# Patient Record
Sex: Male | Born: 2000
Health system: Southern US, Community
[De-identification: ages and names within clinical notes are randomized; demographics above are authoritative.]

## PROBLEM LIST (undated history)

## (undated) DIAGNOSIS — K219 Gastro-esophageal reflux disease without esophagitis: Secondary | ICD-10-CM

## (undated) HISTORY — DX: Gastro-esophageal reflux disease without esophagitis: K21.9

---
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
## 2021-01-30 ENCOUNTER — Other Ambulatory Visit: Payer: Self-pay | Admitting: Physician Assistant

## 2021-01-30 DIAGNOSIS — R1011 Right upper quadrant pain: Secondary | ICD-10-CM

## 2021-01-30 DIAGNOSIS — R1319 Other dysphagia: Secondary | ICD-10-CM

## 2021-02-05 ENCOUNTER — Encounter: Payer: Self-pay | Admitting: Neurology

## 2021-02-13 ENCOUNTER — Ambulatory Visit
Admission: RE | Admit: 2021-02-13 | Discharge: 2021-02-13 | Disposition: A | Discharge status: Home / Self Care | Payer: 59 | Source: Ambulatory Visit | Attending: Physician Assistant | Admitting: Physician Assistant

## 2021-02-13 DIAGNOSIS — R1319 Other dysphagia: Secondary | ICD-10-CM

## 2021-02-13 DIAGNOSIS — R1011 Right upper quadrant pain: Secondary | ICD-10-CM

## 2021-04-11 NOTE — Progress Notes (Signed)
NEUROLOGY CONSULTATION NOTE  Joseph Wilson MRN: [id number] DOB: 05/08/2001  Referring provider: Eliane Decree, PA Primary care provider: Eliane Decree, PA  Reason for consult:  headaches  Assessment/Plan:   1.  Chronic daily headaches - appear to be tension type headaches, likely aggravated by underlying psychiatric distress 2.  Underlying psychiatric disorder with depression and anxiety.  1.  At this time, I would like to pursue a neurologic workup for his symptoms: -  MRI of brain with and without contrast -  EEG 2.  He should continue sessions with his therapist.  However, he was advised to establish care with a psychiatrist as soon as possible. 3.  While he is not under the care of a psychiatrist, I am not yet comfortable treating his headaches with medication as I would typically use an antidepressant or antiepileptic medication which have mood-altering effects - as he has an undiagnosed psychiatric disorder, I do not want to start a medication that may aggravate his symptoms. Patient and his mother are in agreement.   4.  Follow up after testing.   Subjective:  Joseph Wilson is a 20 year old right-handed male who presents for headaches.  History supplemented by referring provider's note.  He is accompanied by his mother who also supplements history.  Patient has GERD.  Last summer, he was started on omeprazole.  Afterwards, he developed a psychosis with increased depression and anxiety as well as intermittent chest pressure, tingling of his extremities, and shaking/abnormal movements.  He had to withdraw from college.  Omeprazole was discontinued around October or November.  He has had some improvement but reports headaches since then.  He notes a pressure/squeezing at the crown, usually mild but occasionally severe, lasting all day.  No associated nausea, vomiting, photophobia, phonophobia, visual disturbance, numbness or weakness.  They occur 6 out of 7 days a week.  He  does not treat with any NSAIDs or analgesics because he is afraid he may have another adverse reaction.  With the headaches, he may feel foggy-headed.  He also has exhibited abnormal behavior.  During the winter, while it was snowing, he was outside laying on his lawn in the snow wearing just a short-sleave shirt and pants.  He also reports that he has disturbing thoughts such as feeling that he hates himself or that Jesus isn't real.  He has had suicidal ideation but denies any plan.  His PCP is aware of these thoughts.  He is seeing a therapist but has not yet been able to schedule an appointment with a psychiatrist.     01/09/2021 LABS:  TSH 1.62; CMP with Na 141, K 4.4, Cl 102, Co2 30, glucose 78, Ca 10.4, BUN 17, Cr 1.23, GFR 92, t bili 1.4, ALP 35, AST 16, ALT 19; CBC with WBC 3.4, HGB 15.2, HCT 45.4, PLT 195  PAST MEDICAL HISTORY: Past Medical History:  Diagnosis Date  . Acid reflux     PAST SURGICAL HISTORY: History reviewed. No pertinent surgical history.  MEDICATIONS: Current Outpatient Medications on File Prior to Visit  Medication Sig Dispense Refill  . fluticasone (FLOVENT HFA) 220 MCG/ACT inhaler 2 puffs    . sucralfate (CARAFATE) 1 g tablet SMARTSIG:1 Tablet(s) By Mouth 1 to 3 Times Daily     No current facility-administered medications on file prior to visit.    ALLERGIES: Allergies  Allergen Reactions  . Omeprazole Other (See Comments)   FAMILY HISTORY: History reviewed. No pertinent family history.  Objective:  Blood  pressure 119/78, pulse 94, resp. rate 20, height 6\' 3"  (1.905 m), weight 167 lb (75.8 kg), SpO2 98 %. General: No acute distress.  Patient appears well-groomed.   Head:  Normocephalic/atraumatic Eyes:  fundi examined but not visualized Neck: supple, no paraspinal tenderness, full range of motion Back: No paraspinal tenderness Heart: regular rate and rhythm Lungs: Clear to auscultation bilaterally. Vascular: No carotid bruits. Neurological  Exam: Mental status: alert and oriented to person, place, and time, recent and remote memory intact, fund of knowledge intact, attention and concentration intact, speech fluent and not dysarthric, language intact. Cranial nerves: CN I: not tested CN II: pupils equal, round and reactive to light, visual fields intact CN III, IV, VI:  full range of motion, no nystagmus, no ptosis CN V: facial sensation intact. CN VII: upper and lower face symmetric CN VIII: hearing intact CN IX, X: gag intact, uvula midline CN XI: sternocleidomastoid and trapezius muscles intact CN XII: tongue midline Bulk & Tone: normal, no fasciculations. Motor:  muscle strength 5/5 throughout Sensation:  Pinprick, temperature and vibratory sensation intact. Deep Tendon Reflexes:  2+ throughout,  toes downgoing.   Finger to nose testing:  Without dysmetria.   Heel to shin:  Without dysmetria.   Gait:  Normal station and stride.  Romberg negative.    Thank you for allowing me to take part in the care of this patient.  , DO  CC: Shon Millet, PA

## 2021-04-12 ENCOUNTER — Ambulatory Visit (INDEPENDENT_AMBULATORY_CARE_PROVIDER_SITE_OTHER): Payer: 59 | Admitting: Neurology

## 2021-04-12 ENCOUNTER — Other Ambulatory Visit: Payer: Self-pay

## 2021-04-12 ENCOUNTER — Encounter: Payer: Self-pay | Admitting: Neurology

## 2021-04-12 VITALS — BP 119/78 | HR 94 | Resp 20 | Ht 75.0 in | Wt 167.0 lb

## 2021-04-12 DIAGNOSIS — R4689 Other symptoms and signs involving appearance and behavior: Secondary | ICD-10-CM | POA: Diagnosis not present

## 2021-04-12 DIAGNOSIS — F419 Anxiety disorder, unspecified: Secondary | ICD-10-CM | POA: Diagnosis not present

## 2021-04-12 DIAGNOSIS — R519 Headache, unspecified: Secondary | ICD-10-CM | POA: Diagnosis not present

## 2021-04-12 NOTE — Patient Instructions (Signed)
1.  Check MRI of brain with and without contrast 2.  Check routine EEG 3.  Recommend establishing care with a psychiatrist as soon as possible.

## 2021-04-13 NOTE — Progress Notes (Signed)
Patient ID: [id number] Time: Time: 04/13/2021 9:14 AM Patient Name: Joseph Wilson, Joseph Wilson Patient DOB: 04/27/2001 Physician Name: Everlena Cooper, ADAM Physician Address: [address] [place], [place] [address] Physician Tax RV:[id number] CPT Code: [phone] Case Number: [id number]  This member's plan does not currently require notification or prior-authorization through the [employer] Notification or Prior-Authorization Program. Please contact a Customer Care Professional at [phone] if you believe the information returned to be in error.

## 2021-04-19 ENCOUNTER — Ambulatory Visit (INDEPENDENT_AMBULATORY_CARE_PROVIDER_SITE_OTHER): Payer: 59 | Admitting: Neurology

## 2021-04-19 ENCOUNTER — Other Ambulatory Visit: Payer: Self-pay

## 2021-04-19 DIAGNOSIS — R519 Headache, unspecified: Secondary | ICD-10-CM

## 2021-04-19 DIAGNOSIS — F419 Anxiety disorder, unspecified: Secondary | ICD-10-CM

## 2021-04-19 DIAGNOSIS — R4689 Other symptoms and signs involving appearance and behavior: Secondary | ICD-10-CM

## 2021-04-20 NOTE — Procedures (Signed)
ELECTROENCEPHALOGRAM REPORT  Date of Study: 04/19/2021  Patient's Name: Joseph Wilson MRN: [id number] Date of Birth: Oct 09, 2001  Referring Provider: Eliane Decree, PA  Clinical History: 20 year old male with chronic daily headaches and underlying psychiatric symptoms presenting with unpleasant thoughts and abnormal behavior.  Medications: Flovent Carafate  Technical Summary: A multichannel digital EEG recording measured by the international 10-20 system with electrodes applied with paste and impedances below 5000 ohms performed in our laboratory with EKG monitoring in an awake and asleep patient.  Photic stimulation was performed.  The digital EEG was referentially recorded, reformatted, and digitally filtered in a variety of bipolar and referential montages for optimal display.    Description: The patient is awake and asleep during the recording.  During maximal wakefulness, there is a symmetric, medium voltage 11 Hz posterior dominant rhythm that attenuates with eye opening.  The record is symmetric.  During drowsiness and sleep, there is an increase in theta slowing of the background.  Stage 2 sleep was seen.  Photic stimulation did not elicit any abnormalities.  During recording, patient pressed button when he noted body jerking, which was not observed on video and produced no electrographic correlate.  There were no epileptiform discharges or electrographic seizures seen.    EKG lead was unremarkable.  Impression: This awake and asleep EEG is normal.    Clinical Correlation: A normal EEG does not exclude a clinical diagnosis of epilepsy.  If further clinical questions remain, prolonged EEG may be helpful.  Clinical correlation is advised.   Shon Millet, DO

## 2022-06-23 ENCOUNTER — Encounter (HOSPITAL_COMMUNITY): Payer: Self-pay | Admitting: Registered Nurse

## 2022-06-23 ENCOUNTER — Ambulatory Visit (INDEPENDENT_AMBULATORY_CARE_PROVIDER_SITE_OTHER)
Admission: EM | Admit: 2022-06-23 | Discharge: 2022-06-24 | Disposition: A | Discharge status: Other Acute Inpatient Hospital | Payer: 59 | Source: Home / Self Care

## 2022-06-23 DIAGNOSIS — F29 Unspecified psychosis not due to a substance or known physiological condition: Secondary | ICD-10-CM | POA: Diagnosis present

## 2022-06-23 DIAGNOSIS — F22 Delusional disorders: Secondary | ICD-10-CM | POA: Insufficient documentation

## 2022-06-23 DIAGNOSIS — F600 Paranoid personality disorder: Secondary | ICD-10-CM | POA: Insufficient documentation

## 2022-06-23 DIAGNOSIS — Z20822 Contact with and (suspected) exposure to covid-19: Secondary | ICD-10-CM | POA: Insufficient documentation

## 2022-06-23 LAB — POCT URINE DRUG SCREEN - MANUAL ENTRY (I-SCREEN)
POC Amphetamine UR: NOT DETECTED
POC Buprenorphine (BUP): NOT DETECTED
POC Cocaine UR: NOT DETECTED
POC Marijuana UR: NOT DETECTED
POC Methadone UR: NOT DETECTED
POC Methamphetamine UR: NOT DETECTED
POC Morphine: NOT DETECTED
POC Oxazepam (BZO): NOT DETECTED
POC Oxycodone UR: NOT DETECTED
POC Secobarbital (BAR): NOT DETECTED

## 2022-06-23 LAB — COMPREHENSIVE METABOLIC PANEL
ALT: 15 U/L (ref 0–44)
AST: 18 U/L (ref 15–41)
Albumin: 4.3 g/dL (ref 3.5–5.0)
Alkaline Phosphatase: 40 U/L (ref 38–126)
Anion gap: 4 — ABNORMAL LOW (ref 5–15)
BUN: 12 mg/dL (ref 6–20)
CO2: 30 mmol/L (ref 22–32)
Calcium: 9.5 mg/dL (ref 8.9–10.3)
Chloride: 104 mmol/L (ref 98–111)
Creatinine, Ser: 1.06 mg/dL (ref 0.61–1.24)
GFR, Estimated: >60 mL/min (ref >60)
Glucose, Bld: 91 mg/dL (ref 70–99)
Potassium: 3.9 mmol/L (ref 3.5–5.1)
Sodium: 138 mmol/L (ref 135–145)
Total Bilirubin: 1.1 mg/dL (ref 0.3–1.2)
Total Protein: 6.9 g/dL (ref 6.5–8.1)

## 2022-06-23 LAB — HEMOGLOBIN A1C
Hgb A1c MFr Bld: 5.1 % (ref 4.8–5.6)
Mean Plasma Glucose: 99.67 mg/dL

## 2022-06-23 LAB — CBC WITH DIFFERENTIAL/PLATELET
Abs Immature Granulocytes: 0.02 10*3/uL (ref 0.00–0.07)
Basophils Absolute: 0 10*3/uL (ref 0.0–0.1)
Basophils Relative: 1 %
Eosinophils Absolute: 0 10*3/uL (ref 0.0–0.5)
Eosinophils Relative: 1 %
HCT: 45.3 % (ref 39.0–52.0)
Hemoglobin: 15.5 g/dL (ref 13.0–17.0)
Immature Granulocytes: 1 %
Lymphocytes Relative: 51 %
Lymphs Abs: 1.9 10*3/uL (ref 0.7–4.0)
MCH: 31.6 pg (ref 26.0–34.0)
MCHC: 34.2 g/dL (ref 30.0–36.0)
MCV: 92.4 fL (ref 80.0–100.0)
Monocytes Absolute: 0.4 10*3/uL (ref 0.1–1.0)
Monocytes Relative: 12 %
Neutro Abs: 1.2 10*3/uL — ABNORMAL LOW (ref 1.7–7.7)
Neutrophils Relative %: 34 %
Platelets: 168 10*3/uL (ref 150–400)
RBC: 4.9 MIL/uL (ref 4.22–5.81)
RDW: 12 % (ref 11.5–15.5)
WBC: 3.6 10*3/uL — ABNORMAL LOW (ref 4.0–10.5)
nRBC: 0 % (ref 0.0–0.2)

## 2022-06-23 LAB — URINALYSIS, ROUTINE W REFLEX MICROSCOPIC
Bilirubin Urine: NEGATIVE
Glucose, UA: NEGATIVE mg/dL
Hgb urine dipstick: NEGATIVE
Ketones, ur: NEGATIVE mg/dL
Leukocytes,Ua: NEGATIVE
Nitrite: NEGATIVE
Protein, ur: NEGATIVE mg/dL
Specific Gravity, Urine: 1.003 — ABNORMAL LOW (ref 1.005–1.030)
pH: 7 (ref 5.0–8.0)

## 2022-06-23 LAB — RESP PANEL BY RT-PCR (FLU A&B, COVID) ARPGX2
Influenza A by PCR: NEGATIVE
Influenza B by PCR: NEGATIVE
SARS Coronavirus 2 by RT PCR: NEGATIVE

## 2022-06-23 LAB — MAGNESIUM: Magnesium: 2 mg/dL (ref 1.7–2.4)

## 2022-06-23 LAB — LIPID PANEL
Cholesterol: 164 mg/dL (ref 0–200)
HDL: 63 mg/dL (ref >40)
LDL Cholesterol: 95 mg/dL (ref 0–99)
Total CHOL/HDL Ratio: 2.6 RATIO
Triglycerides: 32 mg/dL (ref <150)
VLDL: 6 mg/dL (ref 0–40)

## 2022-06-23 LAB — ETHANOL: Alcohol, Ethyl (B): <10 mg/dL (ref <10)

## 2022-06-23 LAB — TSH: TSH: 2.775 u[IU]/mL (ref 0.350–4.500)

## 2022-06-23 MED ORDER — ALUM & MAG HYDROXIDE-SIMETH 200-200-20 MG/5ML PO SUSP: 30.0000 mL | ORAL | Status: DC | PRN

## 2022-06-23 MED ORDER — OLANZAPINE 5 MG PO TBDP
5.0000 mg | ORAL_TABLET | Freq: Every day | ORAL | Status: DC
  Administered 2022-06-23: 5 mg via ORAL
  Filled 2022-06-23: qty 1

## 2022-06-23 MED ORDER — OLANZAPINE 2.5 MG PO TABS
2.5000 mg | ORAL_TABLET | Freq: Once | ORAL | Status: AC
  Administered 2022-06-23: 2.5 mg via ORAL
  Filled 2022-06-23: qty 1

## 2022-06-23 MED ORDER — HYDROXYZINE HCL 25 MG PO TABS: 25.0000 mg | ORAL_TABLET | Freq: Three times a day (TID) | ORAL | Status: DC | PRN

## 2022-06-23 MED ORDER — TRAZODONE HCL 50 MG PO TABS
50.0000 mg | ORAL_TABLET | Freq: Every evening | ORAL | Status: DC | PRN
  Administered 2022-06-23: 50 mg via ORAL
  Filled 2022-06-23: qty 1

## 2022-06-23 MED ORDER — ACETAMINOPHEN 325 MG PO TABS: 650.0000 mg | ORAL_TABLET | Freq: Four times a day (QID) | ORAL | Status: DC | PRN

## 2022-06-23 MED ORDER — MAGNESIUM HYDROXIDE 400 MG/5ML PO SUSP: 30.0000 mL | Freq: Every day | ORAL | Status: DC | PRN

## 2022-06-23 NOTE — ED Notes (Signed)
Pt is sitting up in a chair. Appears at time to be nodding off to sleep. Was encouraged to lay down.  Pt's father called to check on him and was given an update.  Staff will continue to monitor for safety.
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
## 2022-06-23 NOTE — BH Assessment (Signed)
Joseph Wilson, Urgent; male who presents voluntarily to [facility] via [employer].  Pt reports AVH, "I am seeing shadows and invisible people".  Pt denies SI or HI.   Pt presents disoriented and paranoia.  Pt denies using substance use. Pt denies any prior MH diagnosis or prescribed medication for symptom management.d  MSE signed by patient.

## 2022-06-23 NOTE — Progress Notes (Signed)
Per Assunta Found, NP, patient meets criteria for inpatient treatment. There are no available or appropriate beds at [facility] today. CSW faxed referrals to the following facilities for review:  [facility]  Pending - No Request Sent N/A [address] [place] [address] [phone] --  [facility]  Pending - No Request Sent N/A [address], [place] [address] [phone] --  [facility]  Pending - No Request [facility] [address] [place] [address] [phone] --  [facility]  Pending - No Request Sent N/A [address] [place] [address] [phone] --  [facility]  Pending - No Request Sent N/A [address] [place] [address] [phone] --  [facility]  Pending - No Request Sent N/A [address] [place] [address] [phone] --  [facility]  Pending - No Request Sent N/A [address] [place] [address] [phone] --  [facility]  Pending - No Request Sent N/A [address] [place] [address] [phone] --  [facility]  Pending - No Request Sent N/A [address] [place] [address] [phone] --  [facility]  Pending - No Request Sent N/A [address] [place] [address]s., [place] [address] [phone] --  [facility]  Pending - No Request Sent N/A [address] [place] [address] [phone] --  [facility]  Pending - No Request Sent N/A [address] [place] [address] [phone] --   TTS will continue to seek bed placement.  Crissie Reese, MSW, Lenice Pressman Phone: [phone] Disposition/TOC

## 2022-06-23 NOTE — ED Notes (Signed)
Received patient this PM. Patient is sitting on the side of his bed. Patient respirations are even and unlabored. Will continue to monitor for safety.

## 2022-06-23 NOTE — ED Provider Notes (Signed)
[facility] Continuous Assessment Admission H&P  Date: 06/23/22 Patient Name: Joseph Wilson MRN: [id number] Chief Complaint:  Chief Complaint  Patient presents with   Delusional      Diagnoses:  Final diagnoses:  Psychosis, unspecified psychosis type [facility])    HPI: Joseph Wilson 21 y.o., male patient presented to [facility] via [employer] voluntarily as a walk in with complaints of paranoia, auditory and visual hallucinations.  Joseph Wilson, 21 y.o., male patient seen face to face by this provider, consulted with Dr. Nelly Rout; and chart reviewed on 06/23/22.  On evaluation Joseph Wilson reports he called the police because he felt like a funnel was trying to pull him under.  "I felt like a funnel was trying to pull me and I ended up calling the police.  Patient states that he is hearing voices that sometimes sound human and that the voices forces him to go places especially when he is feeling the pull.  Reports that the voices say random things.  State he is seeing shadows.  He denies suicidal ideation at this time but states at one time he had thoughts about it.  When asked about homicidal thoughts he states "Not now but at one time I was dabbling in [place] and sacrifice but I changed my mind.  I had planned on sacrificing for fame and stuff like that but not anymore.  I couldn't do it, it wasn't worth it when it was taking about my father."  Patient then mumbled something that couldn't hear.  Patient also endorses paranoia "Through electronics.  Demons and stuff are able to come through technology portal and attack humans and stuff."  Patient states that he lives with his parents and that he is currently unemployed and not in school.  Patient states that his father knows where he is and gave permission to speak to his father.   Denies prior psychiatric history (No psychiatric hospitalization, psychotropic medications, prior suicide attempt or self harming  behaviors).  During evaluation Joseph Wilson is sitting upright in chair with no noted distress.  He is alert, oriented x 4, calm, cooperative and somewhat attentive; He appears to some detraction with the looking around the room.  He is looking around room as if he is looking for something or seeing something that is not there.  His mood is anxious and guarded with congruent affect.  He has normal speech.  His behavior is guarded.  Objectively Patient appears to be responding to auditory and visual hallucinations with paranoia and delusional thinking.  He is able to converse coherently.  There is some apprehension when discussing starting medication but states he is willing to try it.  Patient answered questions appropriately and remained calm throughout assessment.    Collateral Information:  Spoke to patients father Joseph Wilson at [phone].  Mr. Bouillon states that problem started about 1.5 yr ago after an allergic or adverse reaction to omeprazole.  Prior to that there was no psych history and no family history of mental illness.  "About little over a year ago after he started taking the Omeprazole he started seeing and hearing all kinds of things.  We thought it was the medicine and the doctors have ran all kinds of test CT scans of head, blood work and everything but never really found anything.  He has been seeming a therapist every two weeks for the last year ([facility]).  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  Never had any problems or anything."  States that to his knowledge that patient has never done any drugs or drank alcohol "he doesn't like any of that stuff.  He's a good kid.  He is the type of kid that when all of this started he would pray."  Father is in agreement that medication is needed since nothing else has worked.     PHQ 2-9:     Total Time spent with patient: 45 minutes  Musculoskeletal  Strength & Muscle Tone: within normal limits Gait & Station: normal Patient leans: N/A  Psychiatric Specialty Exam  Presentation General Appearance: Appropriate for Environment  Eye Contact:Good  Speech:Clear and Coherent; Normal Rate  Speech Volume:Normal  Handedness:Right   Mood and Affect  Mood:Anxious  Affect:Congruent   Thought Process  Thought Processes:Coherent  Descriptions of Associations:Circumstantial  Orientation:Full (Time, Place and Person)  Thought Content:Delusions; Paranoid Ideation  Diagnosis of Schizophrenia or Schizoaffective disorder in past: No   Hallucinations:Hallucinations: Auditory; Visual Description of Auditory Hallucinations: States her are hearing voices that sound human sometimes that are telling him to go places.  "Force me to go places especially with the pull.  Saying random things" Description of Visual Hallucinations: States he is seeing shadows  Ideas of Reference:Delusions; Paranoia  Suicidal Thoughts:Suicidal Thoughts: No  Homicidal Thoughts:Homicidal Thoughts: No (Denies at this time but states at one time he was having thoughts of sacrificing someone for fame but couldn't do it so changed his mind about it)   Sensorium  Memory:Immediate Fair; Recent Fair  Judgment:Impaired  Insight:Lacking   Executive Functions  Concentration:Fair  Attention Span:Fair  Recall:Fair  Fund of Knowledge:Fair  Language:Good   Psychomotor Activity  Psychomotor Activity:Psychomotor Activity: Normal   Assets  Assets:Communication Skills; Desire for Improvement; Housing; Social Support   Sleep  Sleep:Sleep: Fair   Nutritional Assessment (For OBS and [facility] admissions only) Has the patient had a weight loss or gain of 10 pounds or more in the last 3 months?: No Has the patient had a decrease in food intake/or appetite?: No Does the patient have  dental problems?: No Does the patient have eating habits or behaviors that may be indicators of an eating disorder including binging or inducing vomiting?: No Has the patient recently lost weight without trying?: 0 Has the patient been eating poorly because of a decreased appetite?: 0 Malnutrition Screening Tool Score: 0    Physical Exam ROS  Blood pressure 125/63, pulse 60, temperature 98.5 F (36.9 C), temperature source Oral, resp. rate 16, height 6' (1.829 m), weight 172 lb (78 kg), SpO2 100 %. Body mass index is 23.33 kg/m.  Past Psychiatric History: Denies prior psychiatric history but psychosis has been going on for 1.5 yr now   Is the patient at risk to self? No  Has the patient been a risk to self in the past 6 months? No .    Has the patient been a risk to self within the distant past? No   Is the patient a risk to others? No   Has the patient been a risk to others in the past 6 months? No   Has the patient been a risk to others within the distant past? No   Past Medical History:  Past Medical History:  Diagnosis Date   Acid reflux    History reviewed. No pertinent surgical  history.  Family History: History reviewed. No pertinent family history.  Social History:  Social History   Socioeconomic History   Marital status: Single    Spouse name: Not on file   Number of children: Not on file   Years of education: Not on file   Highest education level: Not on file  Occupational History   Not on file  Tobacco Use   Smoking status: Never   Smokeless tobacco: Never  Vaping Use   Vaping Use: Never used  Substance and Sexual Activity   Alcohol use: Never   Drug use: Never   Sexual activity: Not on file  Other Topics Concern   Not on file  Social History Narrative   Right handed   Occasionally caffeine   One story home   Social Determinants of Health   Financial Resource Strain: Not on file  Food Insecurity: Not on file  Transportation Needs: Not on file   Physical Activity: Not on file  Stress: Not on file  Social Connections: Not on file  Intimate Partner Violence: Not on file    SDOH:  SDOH Screenings   Alcohol Screen: Not on file  Depression (PHQ2-9): Low Risk  (06/23/2022)   Depression (PHQ2-9)    PHQ-2 Score: 0  Financial Resource Strain: Not on file  Food Insecurity: Not on file  Housing: Not on file  Physical Activity: Not on file  Social Connections: Not on file  Stress: Not on file  Tobacco Use: Low Risk  (06/23/2022)   Patient History    Smoking Tobacco Use: Never    Smokeless Tobacco Use: Never    Passive Exposure: Not on file  Transportation Needs: Not on file    Last Labs:  No visits with results within 6 Month(s) from this visit.  Latest known visit with results is:  Orders Only on 11/09/2019  Component Date Value Ref Range Status   SARS-CoV-2, NAA 11/09/2019 Detected (A)  Not Detected Final   Comment: This nucleic acid amplification test was developed and its performance characteristics determined by [employer]. Nucleic acid amplification tests include PCR and TMA. This test has not been FDA cleared or approved. This test has been authorized by FDA under an Emergency Use Authorization (EUA). This test is only authorized for the duration of time the declaration that circumstances exist justifying the authorization of the emergency use of in vitro diagnostic tests for detection of SARS-CoV-2 virus and/or diagnosis of COVID-19 infection under section 564(b)(1) of the Act, 21 U.S.C. 710GYI-9(S) (1), unless the authorization is terminated or revoked sooner. When diagnostic testing is negative, the possibility of a false negative result should be considered in the context of a patient's recent exposures and the presence of clinical signs and symptoms consistent with COVID-19. An individual without symptoms of COVID-19 and who is not shedding SARS-CoV-2 virus would                           expect to  have a negative (not detected) result in this assay.     Allergies: Omeprazole  PTA Medications: (Not in a hospital admission)   Medical Decision Making  Joseph Wilson was admitted to [facility] continuous assessment unit for Psychotic disorder [facility]), crisis management, and stabilization. Routine labs ordered, which include Lab Orders         Resp Panel by RT-PCR (Flu A&B, Covid) Anterior Nasal Swab  CBC with Differential/Platelet         Comprehensive metabolic panel         Hemoglobin A1c         Magnesium         Ethanol         Lipid panel         TSH         Urinalysis, Routine w reflex microscopic Urine, Clean Catch         POCT Urine Drug Screen - (I-Screen)    Medication Management: Medications started  OLANZapine  2.5 mg Oral Once   OLANZapine zydis  5 mg Oral QHS    Will maintain continuous observation for safety. Social work will consult with family for collateral information and discuss discharge and follow up plan.     Recommendations  Based on my evaluation the patient does not appear to have an emergency medical condition.  Tephanie Escorcia, NP 06/23/22  10:48 AM

## 2022-06-24 ENCOUNTER — Ambulatory Visit (HOSPITAL_COMMUNITY)
Admit: 2022-06-24 | Discharge: 2022-06-24 | Disposition: A | Discharge status: Home / Self Care | Payer: 59 | Attending: Registered Nurse | Admitting: Registered Nurse

## 2022-06-24 ENCOUNTER — Encounter (HOSPITAL_COMMUNITY): Payer: Self-pay | Admitting: Registered Nurse

## 2022-06-24 ENCOUNTER — Inpatient Hospital Stay (HOSPITAL_COMMUNITY)
Admission: AD | Admit: 2022-06-24 | Discharge: 2022-07-26 | DRG: 885 | Disposition: A | Discharge status: Home / Self Care | Payer: 59 | Source: Other Acute Inpatient Hospital | Attending: Psychiatry | Admitting: Psychiatry

## 2022-06-24 ENCOUNTER — Other Ambulatory Visit: Payer: Self-pay

## 2022-06-24 DIAGNOSIS — K5900 Constipation, unspecified: Secondary | ICD-10-CM | POA: Diagnosis present

## 2022-06-24 DIAGNOSIS — F209 Schizophrenia, unspecified: Principal | ICD-10-CM

## 2022-06-24 DIAGNOSIS — F22 Delusional disorders: Secondary | ICD-10-CM | POA: Diagnosis present

## 2022-06-24 DIAGNOSIS — R000 Tachycardia, unspecified: Secondary | ICD-10-CM | POA: Diagnosis present

## 2022-06-24 DIAGNOSIS — Z79899 Other long term (current) drug therapy: Secondary | ICD-10-CM | POA: Diagnosis not present

## 2022-06-24 DIAGNOSIS — K219 Gastro-esophageal reflux disease without esophagitis: Secondary | ICD-10-CM | POA: Diagnosis present

## 2022-06-24 DIAGNOSIS — R45851 Suicidal ideations: Secondary | ICD-10-CM | POA: Diagnosis present

## 2022-06-24 DIAGNOSIS — F29 Unspecified psychosis not due to a substance or known physiological condition: Secondary | ICD-10-CM | POA: Diagnosis present

## 2022-06-24 DIAGNOSIS — Z20822 Contact with and (suspected) exposure to covid-19: Secondary | ICD-10-CM | POA: Diagnosis present

## 2022-06-24 DIAGNOSIS — F200 Paranoid schizophrenia: Secondary | ICD-10-CM | POA: Diagnosis not present

## 2022-06-24 MED ORDER — HYDROXYZINE HCL 25 MG PO TABS: 25.0000 mg | ORAL_TABLET | Freq: Three times a day (TID) | ORAL | 0 refills | Status: DC | PRN

## 2022-06-24 MED ORDER — OLANZAPINE 5 MG PO TBDP: 5.0000 mg | ORAL_TABLET | Freq: Every day | ORAL | Status: DC

## 2022-06-24 MED ORDER — TRAZODONE HCL 50 MG PO TABS
50.0000 mg | ORAL_TABLET | Freq: Every evening | ORAL | Status: DC | PRN
  Administered 2022-06-24 – 2022-06-26 (×3): 50 mg via ORAL
  Filled 2022-06-24 (×4): qty 1

## 2022-06-24 MED ORDER — TRAZODONE HCL 50 MG PO TABS: 50.0000 mg | ORAL_TABLET | Freq: Every evening | ORAL | Status: DC | PRN

## 2022-06-24 MED ORDER — MELATONIN 3 MG PO TABS
3.0000 mg | ORAL_TABLET | Freq: Every day | ORAL | Status: DC
  Filled 2022-06-24 (×3): qty 1

## 2022-06-24 MED ORDER — OLANZAPINE 5 MG PO TBDP
5.0000 mg | ORAL_TABLET | Freq: Every day | ORAL | Status: DC
  Administered 2022-06-24: 5 mg via ORAL
  Filled 2022-06-24 (×3): qty 1

## 2022-06-24 MED ORDER — ACETAMINOPHEN 325 MG PO TABS
650.0000 mg | ORAL_TABLET | Freq: Four times a day (QID) | ORAL | Status: DC | PRN
  Administered 2022-07-01 – 2022-07-02 (×2): 650 mg via ORAL
  Filled 2022-06-24 (×2): qty 2

## 2022-06-24 MED ORDER — ALUM & MAG HYDROXIDE-SIMETH 200-200-20 MG/5ML PO SUSP
30.0000 mL | ORAL | Status: DC | PRN
  Administered 2022-07-16: 30 mL via ORAL
  Filled 2022-06-24 (×2): qty 30

## 2022-06-24 MED ORDER — MAGNESIUM HYDROXIDE 400 MG/5ML PO SUSP
30.0000 mL | Freq: Every day | ORAL | Status: DC | PRN
  Administered 2022-07-08 – 2022-07-16 (×2): 30 mL via ORAL
  Filled 2022-06-24 (×2): qty 30

## 2022-06-24 MED ORDER — ENSURE ENLIVE PO LIQD
237.0000 mL | Freq: Two times a day (BID) | ORAL | Status: DC
  Administered 2022-06-25 – 2022-07-25 (×51): 237 mL via ORAL
  Filled 2022-06-24 (×67): qty 237

## 2022-06-24 NOTE — Tx Team (Signed)
Initial Treatment Plan 06/24/2022 8:33 PM [name] [facility]:[id number]    PATIENT STRESSORS: Other: psychosis     PATIENT STRENGTHS: Average or above average intelligence  Communication skills  Motivation for treatment/growth  Physical Health  Religious Affiliation  Supportive family/friends    PATIENT IDENTIFIED PROBLEMS: Psychosis    Hearing voices    Paranoia              DISCHARGE CRITERIA:  Improved stabilization in mood, thinking, and/or behavior Verbal commitment to aftercare and medication compliance  PRELIMINARY DISCHARGE PLAN: Outpatient therapy Return to previous living arrangement  PATIENT/FAMILY INVOLVEMENT: This treatment plan has been presented to and reviewed with the patient, Joseph Wilson,   The patient has been given the opportunity to ask questions and make suggestions.  Shela Nevin, RN 06/24/2022, 8:33 PM

## 2022-06-24 NOTE — Progress Notes (Signed)
Report given to Vikki Ports, RN [facility].

## 2022-06-24 NOTE — Progress Notes (Addendum)
Patient is a 21 year old male who presented to the [facility] via [facility] voluntarily with complaints of paranoia, and A/VH. Prior to admission, pt reported that he was experiencing strange sensations that pt described as a pulling on his testicles, that pt believed was from a demon. Pt denied using alcohol and drugs. Patient presented calm, cooperative and polite during admission interview and assessment. Pt answered questions logically and coherently, but was a little slow to respond. VS monitored and recorded. Skin check revealed no abnormalities. Patient had no belongings with him to secure in locker. Patient was oriented to unit and schedule. Pt denies suicidal ideation.Q 15 min checks initiated for safety.. PO fluids provided.

## 2022-06-24 NOTE — Progress Notes (Signed)
Adult Psychoeducational Group Note  Date:  06/24/2022 Time:  9:35 PM  Group Topic/Focus:  Wrap-Up Group:   The focus of this group is to help patients review their daily goal of treatment and discuss progress on daily workbooks.  Participation Level:  Active  Participation Quality:  Appropriate  Affect:  Appropriate  Cognitive:  Appropriate  Insight: Appropriate  Engagement in Group:  Engaged  Modes of Intervention:  Discussion  Additional Comments:  Patient AA group.  Charna Busman Long 06/24/2022, 9:35 PM

## 2022-06-24 NOTE — ED Provider Notes (Signed)
Behavioral Health Progress Note  Date and Time: 06/24/2022 9:08 AM Name: Joseph Wilson MRN:  [id number]  Subjective:  Joseph Wilson 21 y.o., male patient presented to [facility] via [facility] voluntarily as a walk in with complaints of paranoia, auditory and visual hallucinations.   Joseph Wilson, 20 y.o., male patient seen face to face by this provider, consulted with Dr. Nelly Rout; and chart reviewed on 06/24/22.  On evaluation Joseph Wilson reports that his mind feels a little clearer today but he continues to have auditory and visual hallucinations. States that the paranoia is better.  Patient reports that he slept well last night.  He reports he was receiving therapy at one time but has seen therapist in 6 months.  During evaluation Joseph Wilson is woken from sleep the sat up in bed.  There is no noted distress.  He is alert, oriented x 4, calm, cooperative and attentive this morning.  His mood is anxious with congruent affect.  He has normal speech.  He initially appeared to be confused but then cleared up once he had woken and sat up in bed.  He continues to look around as if looking for something.  Stating that he continues to have the visual hallucinations.  States he is still hearing the voices but not as bad.  He continues to endorse paranoia but states it feels less than yesterday.  He denies suicidal/self-harm/homicidal ideation.  Will continue to recommend inpatient psychiatric treatment.    Diagnosis:  Final diagnoses:  Psychosis, unspecified psychosis type (HCC)    Total Time spent with patient: 20 minutes  Past Psychiatric History: No prior psychiatric history Past Medical History:  Past Medical History:  Diagnosis Date   Acid reflux    History reviewed. No pertinent surgical history. Family History: History reviewed. No pertinent family history. Family Psychiatric  History: Denies family history of mental illness Social History:  Social History    Substance and Sexual Activity  Alcohol Use Never     Social History   Substance and Sexual Activity  Drug Use Never    Social History   Socioeconomic History   Marital status: Single    Spouse name: Not on file   Number of children: Not on file   Years of education: Not on file   Highest education level: Not on file  Occupational History   Not on file  Tobacco Use   Smoking status: Never   Smokeless tobacco: Never  Vaping Use   Vaping Use: Never used  Substance and Sexual Activity   Alcohol use: Never   Drug use: Never   Sexual activity: Not on file  Other Topics Concern   Not on file  Social History Narrative   Right handed   Occasionally caffeine   One story home   Social Determinants of Health   Financial Resource Strain: Not on file  Food Insecurity: Not on file  Transportation Needs: Not on file  Physical Activity: Not on file  Stress: Not on file  Social Connections: Not on file   SDOH:  SDOH Screenings   Alcohol Screen: Not on file  Depression (PHQ2-9): Low Risk  (06/23/2022)   Depression (PHQ2-9)    PHQ-2 Score: 0  Financial Resource Strain: Not on file  Food Insecurity: Not on file  Housing: Not on file  Physical Activity: Not on file  Social Connections: Not on file  Stress: Not on file  Tobacco Use: Low Risk  (06/24/2022)  Patient History    Smoking Tobacco Use: Never    Smokeless Tobacco Use: Never    Passive Exposure: Not on file  Transportation Needs: Not on file   Additional Social History:    Pain Medications: See MRA Prescriptions: See MRA Over the Counter: See MRA History of alcohol / drug use?: No history of alcohol / drug abuse                    Sleep: Good  Appetite:  Good  Current Medications:  Current Facility-Administered Medications  Medication Dose Route Frequency Provider Last Rate Last Admin   acetaminophen (TYLENOL) tablet 650 mg  650 mg Oral Q6H PRN Shanele Nissan B, NP       alum & mag  hydroxide-simeth (MAALOX/MYLANTA) 200-200-20 MG/5ML suspension 30 mL  30 mL Oral Q4H PRN Willa Brocks B, NP       hydrOXYzine (ATARAX) tablet 25 mg  25 mg Oral TID PRN Vernee Baines B, NP       magnesium hydroxide (MILK OF MAGNESIA) suspension 30 mL  30 mL Oral Daily PRN Kimani Hovis B, NP       OLANZapine zydis (ZYPREXA) disintegrating tablet 5 mg  5 mg Oral QHS Maryruth Apple B, NP   5 mg at 06/23/22 2108   traZODone (DESYREL) tablet 50 mg  50 mg Oral QHS PRN Mailyn Steichen B, NP   50 mg at 06/23/22 2108   Current Outpatient Medications  Medication Sig Dispense Refill   fluticasone (FLOVENT HFA) 220 MCG/ACT inhaler 2 puffs     sucralfate (CARAFATE) 1 g tablet SMARTSIG:1 Tablet(s) By Mouth 1 to 3 Times Daily      Labs  Lab Results:  Admission on 06/23/2022  Component Date Value Ref Range Status   SARS Coronavirus 2 by RT PCR 06/23/2022 NEGATIVE  NEGATIVE Final   Comment: (NOTE) SARS-CoV-2 target nucleic acids are NOT DETECTED.  The SARS-CoV-2 RNA is generally detectable in upper respiratory specimens during the acute phase of infection. The lowest concentration of SARS-CoV-2 viral copies this assay can detect is 138 copies/mL. A negative result does not preclude SARS-Cov-2 infection and should not be used as the sole basis for treatment or other patient management decisions. A negative result may occur with  improper specimen collection/handling, submission of specimen other than nasopharyngeal swab, presence of viral mutation(s) within the areas targeted by this assay, and inadequate number of viral copies(<138 copies/mL). A negative result must be combined with clinical observations, patient history, and epidemiological information. The expected result is Negative.  Fact Sheet for Patients:  BloggerCourse.com  Fact Sheet for Healthcare Providers:  SeriousBroker.it  This test is no                          t yet approved  or cleared by the [place] FDA and  has been authorized for detection and/or diagnosis of SARS-CoV-2 by FDA under an Emergency Use Authorization (EUA). This EUA will remain  in effect (meaning this test can be used) for the duration of the COVID-19 declaration under Section 564(b)(1) of the Act, 21 U.S.C.section 360bbb-3(b)(1), unless the authorization is terminated  or revoked sooner.       Influenza A by PCR 06/23/2022 NEGATIVE  NEGATIVE Final   Influenza B by PCR 06/23/2022 NEGATIVE  NEGATIVE Final   Comment: (NOTE) The Xpert Xpress SARS-CoV-2/FLU/RSV plus assay is intended as an aid in the diagnosis of influenza from Nasopharyngeal swab  specimens and should not be used as a sole basis for treatment. Nasal washings and aspirates are unacceptable for Xpert Xpress SARS-CoV-2/FLU/RSV testing.  Fact Sheet for Patients: BloggerCourse.com  Fact Sheet for Healthcare Providers: SeriousBroker.it  This test is not yet approved or cleared by the [place] FDA and has been authorized for detection and/or diagnosis of SARS-CoV-2 by FDA under an Emergency Use Authorization (EUA). This EUA will remain in effect (meaning this test can be used) for the duration of the COVID-19 declaration under Section 564(b)(1) of the Act, 21 U.S.C. section 360bbb-3(b)(1), unless the authorization is terminated or revoked.  Performed at [facility], [address] [place], [place] [address]    WBC 06/23/2022 3.6 (L)  4.0 - 10.5 K/uL Final   RBC 06/23/2022 4.90  4.22 - 5.81 MIL/uL Final   Hemoglobin 06/23/2022 15.5  13.0 - 17.0 g/dL Final   HCT 60/63/0160 45.3  39.0 - 52.0 % Final   MCV 06/23/2022 92.4  80.0 - 100.0 fL Final   MCH 06/23/2022 31.6  26.0 - 34.0 pg Final   MCHC 06/23/2022 34.2  30.0 - 36.0 g/dL Final   RDW 10/93/2355 12.0  11.5 - 15.5 % Final   Platelets 06/23/2022 168  150 - 400 K/uL Final   nRBC 06/23/2022 0.0  0.0 - 0.2  % Final   Neutrophils Relative % 06/23/2022 34  % Final   Neutro Abs 06/23/2022 1.2 (L)  1.7 - 7.7 K/uL Final   Lymphocytes Relative 06/23/2022 51  % Final   Lymphs Abs 06/23/2022 1.9  0.7 - 4.0 K/uL Final   Monocytes Relative 06/23/2022 12  % Final   Monocytes Absolute 06/23/2022 0.4  0.1 - 1.0 K/uL Final   Eosinophils Relative 06/23/2022 1  % Final   Eosinophils Absolute 06/23/2022 0.0  0.0 - 0.5 K/uL Final   Basophils Relative 06/23/2022 1  % Final   Basophils Absolute 06/23/2022 0.0  0.0 - 0.1 K/uL Final   Immature Granulocytes 06/23/2022 1  % Final   Abs Immature Granulocytes 06/23/2022 0.02  0.00 - 0.07 K/uL Final   Performed at [facility], [address] [place], [place] [address]   Sodium 06/23/2022 138  135 - 145 mmol/L Final   Potassium 06/23/2022 3.9  3.5 - 5.1 mmol/L Final   Chloride 06/23/2022 104  98 - 111 mmol/L Final   CO2 06/23/2022 30  22 - 32 mmol/L Final   Glucose, Bld 06/23/2022 91  70 - 99 mg/dL Final   Glucose reference range applies only to samples taken after fasting for at least 8 hours.   BUN 06/23/2022 12  6 - 20 mg/dL Final   Creatinine, Ser 06/23/2022 1.06  0.61 - 1.24 mg/dL Final   Calcium 25/42/7062 9.5  8.9 - 10.3 mg/dL Final   Total Protein 37/62/8315 6.9  6.5 - 8.1 g/dL Final   Albumin 17/61/6073 4.3  3.5 - 5.0 g/dL Final   AST 71/05/2693 18  15 - 41 U/L Final   ALT 06/23/2022 15  0 - 44 U/L Final   Alkaline Phosphatase 06/23/2022 40  38 - 126 U/L Final   Total Bilirubin 06/23/2022 1.1  0.3 - 1.2 mg/dL Final   GFR, Estimated 06/23/2022 >60  >60 mL/min Final   Comment: (NOTE) Calculated using the CKD-EPI Creatinine Equation (2021)    Anion gap 06/23/2022 4 (L)  5 - 15 Final   Performed at [facility], [address] [place], [place] [address]  Hgb A1c MFr Bld 06/23/2022 5.1  4.8 - 5.6 % Final   Comment: (NOTE) Pre diabetes:          5.7%-6.4%  Diabetes:              >6.4%  Glycemic control for   <7.0% adults with diabetes     Mean Plasma Glucose 06/23/2022 99.67  mg/dL Final   Performed at [facility], [address] [place], [place] [address]   Magnesium 06/23/2022 2.0  1.7 - 2.4 mg/dL Final   Performed at [facility], [address], [place], [place] [address]   Alcohol, Ethyl (B) 06/23/2022 <10  <10 mg/dL Final   Comment: (NOTE) Lowest detectable limit for serum alcohol is 10 mg/dL.  For medical purposes only. Performed at [facility], [address], [place], [place] [address]    Cholesterol 06/23/2022 164  0 - 200 mg/dL Final   Triglycerides 22/97/9892 32  <150 mg/dL Final   HDL 11/94/1740 63  >40 mg/dL Final   Total CHOL/HDL Ratio 06/23/2022 2.6  RATIO Final   VLDL 06/23/2022 6  0 - 40 mg/dL Final   LDL Cholesterol 06/23/2022 95  0 - 99 mg/dL Final   Comment:        Total Cholesterol/HDL:CHD Risk Coronary Heart Disease Risk Table                     Men   Women  1/2 Average Risk   3.4   3.3  Average Risk       5.0   4.4  2 X Average Risk   9.6   7.1  3 X Average Risk  23.4   11.0        Use the calculated Patient Ratio above and the CHD Risk Table to determine the patient's CHD Risk.        ATP III CLASSIFICATION (LDL):  <100     mg/dL   Optimal  814-481  mg/dL   Near or Above                    Optimal  130-159  mg/dL   Borderline  856-314  mg/dL   High  >970     mg/dL   Very High Performed at [facility], [address], [place], [place] [address]    TSH 06/23/2022 2.775  0.350 - 4.500 uIU/mL Final   Comment: Performed by a 3rd Generation assay with a functional sensitivity of <=0.01 uIU/mL. Performed at [facility], [address], [place], [place] [address]    Color, Urine 06/23/2022 STRAW (A)  YELLOW Final   APPearance 06/23/2022 CLEAR  CLEAR Final   Specific Gravity, Urine 06/23/2022 1.003 (L)  1.005 - 1.030 Final   pH 06/23/2022 7.0  5.0 - 8.0 Final   Glucose, UA 06/23/2022 NEGATIVE  NEGATIVE mg/dL Final   Hgb urine dipstick 06/23/2022  NEGATIVE  NEGATIVE Final   Bilirubin Urine 06/23/2022 NEGATIVE  NEGATIVE Final   Ketones, ur 06/23/2022 NEGATIVE  NEGATIVE mg/dL Final   Protein, ur 27/74/1287 NEGATIVE  NEGATIVE mg/dL Final   Nitrite 86/76/7209 NEGATIVE  NEGATIVE Final   Leukocytes,Ua 06/23/2022 NEGATIVE  NEGATIVE Final   Performed at [facility], [address] [place], [place] [address]   POC Amphetamine UR 06/23/2022 None Detected  NONE DETECTED (Cut Off Level 1000 ng/mL) Final   POC Secobarbital (BAR) 06/23/2022 None Detected  NONE DETECTED (Cut Off Level 300 ng/mL) Final   POC Buprenorphine (BUP) 06/23/2022 None Detected  NONE DETECTED (Cut Off Level 10 ng/mL) Final   POC Oxazepam (BZO) 06/23/2022 None Detected  NONE DETECTED (Cut Off Level 300 ng/mL) Final   POC Cocaine UR 06/23/2022 None Detected  NONE DETECTED (Cut Off Level 300 ng/mL) Final   POC Methamphetamine UR 06/23/2022 None Detected  NONE DETECTED (Cut Off Level 1000 ng/mL) Final   POC Morphine 06/23/2022 None Detected  NONE DETECTED (Cut Off Level 300 ng/mL) Final   POC Methadone UR 06/23/2022 None Detected  NONE DETECTED (Cut Off Level 300 ng/mL) Final   POC Oxycodone UR 06/23/2022 None Detected  NONE DETECTED (Cut Off Level 100 ng/mL) Final   POC Marijuana UR 06/23/2022 None Detected  NONE DETECTED (Cut Off Level 50 ng/mL) Final    Blood Alcohol level:  Lab Results  Component Value Date   ETH <10 06/23/2022    Metabolic Disorder Labs: Lab Results  Component Value Date   HGBA1C 5.1 06/23/2022   MPG 99.67 06/23/2022   No results found for: "PROLACTIN" Lab Results  Component Value Date   CHOL 164 06/23/2022   TRIG 32 06/23/2022   HDL 63 06/23/2022   CHOLHDL 2.6 06/23/2022   VLDL 6 06/23/2022   LDLCALC 95 06/23/2022    Therapeutic Lab Levels: No results found for: "LITHIUM" No results found for: "VALPROATE" No results found for: "CBMZ"  Physical Findings   PHQ2-9    Flowsheet Row ED from 06/23/2022 in [facility]  PHQ-2 Total Score 0      Flowsheet Row ED from 06/23/2022 in [facility]  C-SSRS RISK CATEGORY No Risk        Musculoskeletal  Strength & Muscle Tone: within normal limits Gait & Station: normal Patient leans: N/A  Psychiatric Specialty Exam  Presentation  General Appearance: Appropriate for Environment  Eye Contact:Good  Speech:Clear and Coherent; Normal Rate  Speech Volume:Normal  Handedness:Right   Mood and Affect  Mood:Anxious  Affect:Congruent   Thought Process  Thought Processes:Coherent  Descriptions of Associations:Circumstantial  Orientation:Full (Time, Place and Person)  Thought Content:Delusions; Paranoid Ideation  Diagnosis of Schizophrenia or Schizoaffective disorder in past: No    Hallucinations:Hallucinations: Auditory; Visual Description of Auditory Hallucinations: States her are hearing voices that sound human sometimes that are telling him to go places.  "Force me to go places especially with the pull.  Saying random things" Description of Visual Hallucinations: States he is seeing shadows  Ideas of Reference:Delusions; Paranoia  Suicidal Thoughts:Suicidal Thoughts: No  Homicidal Thoughts:Homicidal Thoughts: No   Sensorium  Memory:Immediate Fair; Recent Fair  Judgment:Impaired  Insight:Fair   Executive Functions  Concentration:Fair  Attention Span:Fair  Recall:Fair  Fund of Knowledge:Fair  Language:Good   Psychomotor Activity  Psychomotor Activity:Psychomotor Activity: Normal   Assets  Assets:Communication Skills; Desire for Improvement; Financial Resources/Insurance; Housing; Resilience; Social Support   Sleep  Sleep:Sleep: Fair   Nutritional Assessment (For OBS and FBC admissions only) Has the patient had a weight loss or gain of 10 pounds or more in the last 3 months?: No Has the patient had a decrease in food intake/or appetite?: No Does the patient  have dental problems?: No Does the patient have eating habits or behaviors that may be indicators of an eating disorder including binging or inducing vomiting?: No Has the patient recently lost weight without trying?: 0 Has the patient been eating poorly because of a decreased appetite?: 0  Malnutrition Screening Tool Score: 0    Physical Exam  Physical Exam Vitals and nursing note reviewed. Exam conducted with a chaperone present.  Constitutional:      General: He is not in acute distress.    Appearance: Normal appearance. He is not ill-appearing.  Cardiovascular:     Rate and Rhythm: Normal rate.  Pulmonary:     Effort: Pulmonary effort is normal.  Musculoskeletal:        General: Normal range of motion.     Cervical back: Normal range of motion.  Skin:    General: Skin is warm and dry.  Neurological:     Mental Status: He is alert and oriented to person, place, and time.  Psychiatric:        Attention and Perception: He perceives auditory and visual hallucinations.        Mood and Affect: Mood is anxious.        Speech: Speech normal.        Behavior: Behavior is cooperative.        Thought Content: Thought content is paranoid and delusional. Thought content does not include homicidal or suicidal ideation.    Review of Systems  Constitutional: Negative.   HENT: Negative.    Eyes: Negative.   Respiratory: Negative.    Cardiovascular: Negative.   Gastrointestinal: Negative.   Genitourinary: Negative.   Musculoskeletal: Negative.   Skin: Negative.   Neurological: Negative.   Endo/Heme/Allergies: Negative.   Psychiatric/Behavioral:  Positive for hallucinations. Negative for substance abuse and suicidal ideas. The patient is nervous/anxious. Insomnia: Better.   Blood pressure 130/64, pulse (!) 58, temperature 97.7 F (36.5 C), temperature source Oral, resp. rate 18, height 6' (1.829 m), weight 172 lb (78 kg), SpO2 100 %. Body mass index is 23.33 kg/m.  Treatment Plan  Summary: Daily contact with patient to assess and evaluate symptoms and progress in treatment, Medication management, and Plan Inpatient psychiatric treatment   Skylan Gift, NP 06/24/2022 9:08 AM

## 2022-06-24 NOTE — Progress Notes (Signed)
Pt is asleep. Respirations are even and unlabored. No signs of acute distress noted. Staff will monitor for pt's safety. 

## 2022-06-24 NOTE — Progress Notes (Signed)
Call placed to Jeremy Ditullio, mother [phone] and she was informed of pt's transfer to [facility].

## 2022-06-24 NOTE — Progress Notes (Signed)
  Pts presents with high anxiety. Pt is very suspicious. Pt reports he is unable to sleep.  Pt denies SI/HI.  Pt endorses AVH.  PRN Trazodone administered per [facility] per pt request.    Pt is safe on unit with Q 15 minute safety checks.      06/24/22 2116  Psych Admission Type (Psych Patients Only)  Admission Status Voluntary  Psychosocial Assessment  Patient Complaints Anxiety;Sleep disturbance;Suspiciousness  Eye Contact Fair  Facial Expression Flat  Affect Anxious;Preoccupied  Speech Soft  Interaction Cautious;Guarded  Motor Activity Other (Comment) (WDL)  Appearance/Hygiene Unremarkable  Behavior Characteristics Cooperative;Calm  Mood Suspicious;Anxious  Thought Process  Coherency Blocking  Content Preoccupation  Delusions None reported or observed  Perception Hallucinations  Hallucination Auditory;Visual  Judgment Limited  Confusion Mild  Danger to Self  Current suicidal ideation? Denies  Danger to Others  Danger to Others None reported or observed  Danger to Others Abnormal  Harmful Behavior to others No threats or harm toward other people  Destructive Behavior No threats or harm toward property

## 2022-06-24 NOTE — Plan of Care (Signed)
  Problem: Education: Goal: Emotional status will improve Outcome: Not Progressing Goal: Mental status will improve Outcome: Not Progressing   

## 2022-06-24 NOTE — ED Notes (Signed)
Patient cooperative with medication administration. Patient sat in chair sleeping on and off. Patient asked to leave because he thought he was here for an MRI today and if its later he will wait at home. Advised patient that he would need to wait until the morning to speak with a provider.

## 2022-06-24 NOTE — Progress Notes (Addendum)
Pt was accepted to [facility] 06/24/22 PENDING Head CT: Bed Assignment 402-2  DX Acute Psychosis  Pt meets inpatient criteria per Assunta Found, NP  Attending Physician will be Dr. Phineas Inches  Report can be called to: Adult unit: [phone]  Pt can arrive after 1300  Care Team notified: Assunta Found, NP, Roddie Mc, RN, Dereck Ligas, Ryta Marquis Lunch, RN, Rush Farmer, [facility] Malva Limes, RN.  Kelton Pillar, LCSWA 06/24/2022 @ 12:10 PM

## 2022-06-24 NOTE — ED Provider Notes (Signed)
FBC/OBS ASAP Discharge Summary  Date and Time: 06/24/2022 11:38 AM  Name: Joseph Wilson  MRN:  [id number]   Discharge Diagnoses:  Final diagnoses:  Psychosis, unspecified psychosis type (HCC)    Subjective: Subjective:  Joseph Wilson 21 y.o., male patient presented to [facility] via [employer] voluntarily as a walk in with complaints of paranoia, auditory and visual hallucinations.     Joseph Wilson, 21 y.o., male patient seen face to face by this provider, consulted with Dr. Nelly Rout; and chart reviewed on 06/24/22.  On evaluation Joseph Wilson reports that his mind feels a little clearer today but he continues to have auditory and visual hallucinations. States that the paranoia is better.  Patient reports that he slept well last night.  He reports he was receiving therapy at one time but has seen therapist in 6 months.  During evaluation Joseph Wilson is woken from sleep the sat up in bed.  There is no noted distress.  He is alert, oriented x 4, calm, cooperative and attentive this morning.  His mood is anxious with congruent affect.  He has normal speech.  He initially appeared to be confused but then cleared up once he had woken and sat up in bed.  He continues to look around as if looking for something.  Stating that he continues to have the visual hallucinations.  States he is still hearing the voices but not as bad.  He continues to endorse paranoia but states it feels less than yesterday.  He denies suicidal/self-harm/homicidal ideation.  Will continue to recommend inpatient psychiatric treatment.    Past Medical History:  Past Medical History:  Diagnosis Date   Acid reflux    History reviewed. No pertinent surgical history. Family History: History reviewed. No pertinent family history.  Social History:  Social History   Substance and Sexual Activity  Alcohol Use Never     Social History   Substance and Sexual Activity  Drug Use Never    Social History    Socioeconomic History   Marital status: Single    Spouse name: Not on file   Number of children: Not on file   Years of education: Not on file   Highest education level: Not on file  Occupational History   Not on file  Tobacco Use   Smoking status: Never   Smokeless tobacco: Never  Vaping Use   Vaping Use: Never used  Substance and Sexual Activity   Alcohol use: Never   Drug use: Never   Sexual activity: Not on file  Other Topics Concern   Not on file  Social History Narrative   Right handed   Occasionally caffeine   One story home   Social Determinants of Health   Financial Resource Strain: Not on file  Food Insecurity: Not on file  Transportation Needs: Not on file  Physical Activity: Not on file  Stress: Not on file  Social Connections: Not on file   SDOH:  SDOH Screenings   Alcohol Screen: Not on file  Depression (PHQ2-9): Low Risk  (06/23/2022)   Depression (PHQ2-9)    PHQ-2 Score: 0  Financial Resource Strain: Not on file  Food Insecurity: Not on file  Housing: Not on file  Physical Activity: Not on file  Social Connections: Not on file  Stress: Not on file  Tobacco Use: Low Risk  (06/24/2022)   Patient History    Smoking Tobacco Use: Never    Smokeless Tobacco Use: Never  Passive Exposure: Not on file  Transportation Needs: Not on file   Current Medications:  Current Facility-Administered Medications  Medication Dose Route Frequency Provider Last Rate Last Admin   acetaminophen (TYLENOL) tablet 650 mg  650 mg Oral Q6H PRN Gwenn Teodoro B, NP       alum & mag hydroxide-simeth (MAALOX/MYLANTA) 200-200-20 MG/5ML suspension 30 mL  30 mL Oral Q4H PRN Wanda Rideout B, NP       hydrOXYzine (ATARAX) tablet 25 mg  25 mg Oral TID PRN Jull Harral B, NP       magnesium hydroxide (MILK OF MAGNESIA) suspension 30 mL  30 mL Oral Daily PRN Draco Malczewski B, NP       OLANZapine zydis (ZYPREXA) disintegrating tablet 5 mg  5 mg Oral QHS Adriann Ballweg B, NP    5 mg at 06/23/22 2108   traZODone (DESYREL) tablet 50 mg  50 mg Oral QHS PRN Calan Doren B, NP   50 mg at 06/23/22 2108   Current Outpatient Medications  Medication Sig Dispense Refill   ASHWAGANDHA PO Take 1 capsule by mouth in the morning and at bedtime.     Multiple Vitamins-Minerals (ADULT GUMMY PO) Take 1 tablet by mouth daily.     neomycin-bacitracin-polymyxin (NEOSPORIN) [phone] ointment Apply 1 Application topically daily as needed (Apply to affected area).     hydrOXYzine (ATARAX) 25 MG tablet Take 1 tablet (25 mg total) by mouth 3 (three) times daily as needed for anxiety. 30 tablet 0   OLANZapine zydis (ZYPREXA) 5 MG disintegrating tablet Take 1 tablet (5 mg total) by mouth at bedtime.     traZODone (DESYREL) 50 MG tablet Take 1 tablet (50 mg total) by mouth at bedtime as needed for sleep.      PTA Medications: (Not in a hospital admission)      06/23/2022    9:44 AM  Depression screen PHQ 2/9  Decreased Interest 0  Down, Depressed, Hopeless 0  PHQ - 2 Score 0    Flowsheet Row ED from 06/23/2022 in [facility]  C-SSRS RISK CATEGORY No Risk       Musculoskeletal  Strength & Muscle Tone: within normal limits Gait & Station: normal Patient leans: N/A  Psychiatric Specialty Exam  Presentation  General Appearance: Appropriate for Environment  Eye Contact:Good  Speech:Clear and Coherent; Normal Rate  Speech Volume:Normal  Handedness:Right   Mood and Affect  Mood:Anxious  Affect:Congruent   Thought Process  Thought Processes:Coherent  Descriptions of Associations:Circumstantial  Orientation:Full (Time, Place and Person)  Thought Content:Delusions; Paranoid Ideation  Diagnosis of Schizophrenia or Schizoaffective disorder in past: No    Hallucinations:Hallucinations: Auditory; Visual Description of Auditory Hallucinations: States her are hearing voices that sound human sometimes that are telling him to go places.   "Force me to go places especially with the pull.  Saying random things" Description of Visual Hallucinations: States he is seeing shadows  Ideas of Reference:Delusions; Paranoia  Suicidal Thoughts:Suicidal Thoughts: No  Homicidal Thoughts:Homicidal Thoughts: No   Sensorium  Memory:Immediate Fair; Recent Fair  Judgment:Impaired  Insight:Fair   Executive Functions  Concentration:Fair  Attention Span:Fair  Recall:Fair  Fund of Knowledge:Fair  Language:Good   Psychomotor Activity  Psychomotor Activity:Psychomotor Activity: Normal   Assets  Assets:Communication Skills; Desire for Improvement; Financial Resources/Insurance; Housing; Resilience; Social Support   Sleep  Sleep:Sleep: Fair   Nutritional Assessment (For OBS and FBC admissions only) Has the patient had a weight loss or gain of 10 pounds or more  in the last 3 months?: No Has the patient had a decrease in food intake/or appetite?: No Does the patient have dental problems?: No Does the patient have eating habits or behaviors that may be indicators of an eating disorder including binging or inducing vomiting?: No Has the patient recently lost weight without trying?: 0 Has the patient been eating poorly because of a decreased appetite?: 0 Malnutrition Screening Tool Score: 0    Physical Exam  Physical Exam Vitals and nursing note reviewed. Exam conducted with a chaperone present.  Constitutional:      General: He is not in acute distress.    Appearance: Normal appearance. He is not ill-appearing.  Cardiovascular:     Rate and Rhythm: Normal rate.  Pulmonary:     Effort: Pulmonary effort is normal.  Musculoskeletal:        General: Normal range of motion.     Cervical back: Normal range of motion.  Skin:    General: Skin is warm and dry.  Neurological:     Mental Status: He is alert and oriented to person, place, and time.  Psychiatric:        Attention and Perception: He perceives auditory and  visual hallucinations.        Mood and Affect: Mood is anxious.        Speech: Speech normal.        Behavior: Behavior is cooperative.        Thought Content: Thought content is paranoid and delusional. Thought content does not include homicidal or suicidal ideation.    Review of Systems  Constitutional: Negative.   HENT: Negative.    Eyes: Negative.   Respiratory: Negative.    Cardiovascular: Negative.   Gastrointestinal: Negative.   Genitourinary: Negative.   Musculoskeletal: Negative.   Skin: Negative.   Neurological: Negative.   Endo/Heme/Allergies: Negative.   Psychiatric/Behavioral:  Positive for hallucinations. Negative for substance abuse and suicidal ideas. The patient is nervous/anxious. Insomnia: Better.   Blood pressure 130/64, pulse (!) 58, temperature 97.7 F (36.5 C), resp. rate 18, height 6' (1.829 m), weight 172 lb (78 kg), SpO2 100 %. Body mass index is 23.33 kg/m.  Disposition:  Patient recommended for inpatient psychiatric treatment.  He has been accepted to [facility].  Patient will be transferred to [facility] after CT scan of head is completed at [facility] ED  Karianne Nogueira, NP 06/24/2022, 11:38 AM

## 2022-06-24 NOTE — Progress Notes (Signed)
Pt left for CT scan at [facility]. Pt will then proceed to [facility] per [facility], NP order. Discussed with pt and all questions answered. Pt did not have any belongings. Pt was accompanied by [facility]   and was transferred via safe transport.

## 2022-06-25 DIAGNOSIS — R45851 Suicidal ideations: Secondary | ICD-10-CM

## 2022-06-25 DIAGNOSIS — F200 Paranoid schizophrenia: Secondary | ICD-10-CM

## 2022-06-25 DIAGNOSIS — F209 Schizophrenia, unspecified: Principal | ICD-10-CM

## 2022-06-25 DIAGNOSIS — F29 Unspecified psychosis not due to a substance or known physiological condition: Principal | ICD-10-CM

## 2022-06-25 MED ORDER — HYDROXYZINE HCL 25 MG PO TABS
25.0000 mg | ORAL_TABLET | Freq: Three times a day (TID) | ORAL | Status: DC | PRN
  Administered 2022-06-25 – 2022-07-13 (×7): 25 mg via ORAL
  Filled 2022-06-25 (×9): qty 1

## 2022-06-25 MED ORDER — OLANZAPINE 10 MG IM SOLR: 5.0000 mg | Freq: Four times a day (QID) | INTRAMUSCULAR | Status: DC | PRN

## 2022-06-25 MED ORDER — OLANZAPINE 5 MG PO TBDP
5.0000 mg | ORAL_TABLET | Freq: Four times a day (QID) | ORAL | Status: DC | PRN
  Administered 2022-06-25 – 2022-06-27 (×3): 5 mg via ORAL
  Filled 2022-06-25 (×2): qty 1

## 2022-06-25 MED ORDER — ARIPIPRAZOLE 5 MG PO TABS
5.0000 mg | ORAL_TABLET | Freq: Every day | ORAL | Status: DC
  Administered 2022-06-25 – 2022-06-26 (×2): 5 mg via ORAL
  Filled 2022-06-25 (×4): qty 1

## 2022-06-25 NOTE — Progress Notes (Signed)
   At approximately 2340, spoke with pt complaining that he was unable to sleep.  Pt was not open to taking anymore medicine stating, "I have taken too much medicine. Pt agreed to take melatonin if not asleep in 30 minutes.  Pt further stated, per [facility] , "I feel like someone is shooting me in my room and I can feel the actual pain".  Pt also reported, "people speaking to him in his head.  Pt reports he see black shadows and white beams.  Pt denies SI/HI and verbally contracts for safety.  Pt is safe on the unit with Q 15 minute safety checks.

## 2022-06-25 NOTE — BHH Suicide Risk Assessment (Signed)
[facility] Admission Suicide Risk Assessment   Nursing information obtained from:  Patient Demographic factors:  Male, Adolescent or young adult, Unemployed Current Mental Status:  NA Loss Factors:  NA Historical Factors:  NA Risk Reduction Factors:  Sense of responsibility to family, Positive social support, Religious beliefs about death, Living with another person, especially a relative, Positive therapeutic relationship  Total Time spent with patient: 1 hour Principal Problem: Schizophrenia (HCC) Diagnosis:  Principal Problem:   Schizophrenia (HCC)  Subjective Data: Joseph Wilson is a 21 y.o., male with no past psychiatric history who presents to the [facility] from [facility] for evaluation and management of worsening psychosis and paranoia.  According to outside records, the patient presented to the behavioral health unit for [place] via [place] police voluntarily as a walk-in with complaint paranoia, auditory and visual hallucinations.   Initial assessment on 7/25, patient was evaluated on the inpatient unit, the patient reports no history of diagnosed mental illness, presents with thought blocking and disorganized thought process occasionally unable to answer in linear manner seems to be preoccupied with his paranoia and delusions, seems confused answering some questions for example he tells me he went to school up to seventh grade.  Per his father he finishes school and was about to start college.  Patient reports he is here "I called the cops brought me here I have threats losing my testicles getting castrated to be a woman, my testicles being pulled by a group of scientist in order not to have kids" she does report decreased sleep yet his father reported he has been sleeping average 6 to 8 hours a night, he does report decreased appetite.  His father reports patient having fair appetite in general, patient reports occasionally feeling suicidal but  unable to describe any further details, his father reports patient never reported any thoughts of harming self or others.  Patient reports of intermittent visual hallucinations tells me that he hears voices of demons "telling me directions to go" he tells me that occasionally he hears voices telling him to harm himself but unable to specify further details "I keep eating" he keeps getting disorganized during evaluation unable to remain linear.  He also reports visual hallucinations seeing people "rappers" then starts talking about "things taken away from me for the devil" he admits to getting messages from the TV "abnormal stuff cross upside down" then tells me that he is seeing "mist in the room" then asks the provider if I see it. He denies any history of trauma or symptoms consistent with PTSD, he denies any symptoms consistent with mania or hypomania.  Continued Clinical Symptoms:  Alcohol Use Disorder Identification Test Final Score (AUDIT): 0 The "Alcohol Use Disorders Identification Test", Guidelines for Use in Primary Care, Second Edition.  World Science writer [facility]). Score between 0-7:  no or low risk or alcohol related problems. Score between 8-15:  moderate risk of alcohol related problems. Score between 16-19:  high risk of alcohol related problems. Score 20 or above:  warrants further diagnostic evaluation for alcohol dependence and treatment.   CLINICAL FACTORS:   Schizophrenia:   Command hallucinatons Depressive state Less than 36 years old Paranoid or undifferentiated type   Musculoskeletal: Strength & Muscle Tone: within normal limits Gait & Station: normal Patient leans: N/A  Psychiatric Specialty Exam:  Presentation  General Appearance: Appropriate for Environment  Eye Contact:Good  Speech:Clear and Coherent; Normal Rate  Speech Volume:Normal  Handedness:Right   Mood and Affect  Mood:Anxious  Affect:Congruent   Thought Process  Thought  Processes:Coherent  Descriptions of Associations:Circumstantial  Orientation:Full (Time, Place and Person)  Thought Content:Delusions; Paranoid Ideation  History of Schizophrenia/Schizoaffective disorder:No  Duration of Psychotic Symptoms:N/A  Hallucinations:Hallucinations: Auditory; Visual  Ideas of Reference:Delusions; Paranoia  Suicidal Thoughts:Suicidal Thoughts: No  Homicidal Thoughts:Homicidal Thoughts: No   Sensorium  Memory:Immediate Fair; Recent Fair  Judgment:Impaired  Insight:Fair   Executive Functions  Concentration:Fair  Attention Span:Fair  Recall:Fair  Fund of Knowledge:Fair  Language:Good   Psychomotor Activity  Psychomotor Activity:Psychomotor Activity: Normal   Assets  Assets:Communication Skills; Desire for Improvement; Financial Resources/Insurance; Housing; Resilience; Social Support   Sleep  Sleep:Sleep: Fair    Physical Exam: Physical Exam ROS Blood pressure (!) 145/86, pulse 80, temperature 98.3 F (36.8 C), temperature source Oral, resp. rate 16, height 6\' 3"  (1.905 m), weight 81.6 kg, SpO2 100 %. Body mass index is 22.5 kg/m.   COGNITIVE FEATURES THAT CONTRIBUTE TO RISK:  None    SUICIDE RISK:   Severe:  Frequent, intense, and enduring suicidal ideation, specific plan, no subjective intent, but some objective markers of intent (i.e., choice of lethal method), the method is accessible, some limited preparatory behavior, evidence of impaired self-control, severe dysphoria/symptomatology, multiple risk factors present, and few if any protective factors, particularly a lack of social support.  PLAN OF CARE:   Safety and Monitoring:             -- Voluntary admission to inpatient psychiatric unit for safety, stabilization and treatment             -- Daily contact with patient to assess and evaluate symptoms and progress in treatment             -- Patient's case to be discussed in multi-disciplinary team meeting              -- Observation Level : q15 minute checks             -- Vital signs:  q12 hours             -- Precautions: suicide, elopement, and assault   2. Medications:              Start Abilify 5 mg daily for psychosis, titrate to 10 mg in the next few days for efficacy.  If effective will consider Abilify Maintena injection monthly to improve long-term compliance.             Zyprexa p.o. or IM as needed for psychotic agitation, monitor need and use.             Vistaril as needed for anxiety             Trazodone as needed for sleep  The risks/benefits/side-effects/alternatives to this medication were discussed in detail with the patient and time was given for questions. The patient consents to medication trial.                -- Metabolic profile and EKG monitoring obtained while on an atypical antipsychotic (BMI: Lipid Panel: HbgA1c: QTc:)                            3. Labs Reviewed: CMP within normal limits, lipid profile within normal limits, CBC within normal limits, hemoglobin A1c 5.1, TSH within normal limits UA no UTI noted, urine drug screen negative, CT head no abnormalities, EKG 7/23 QTc 370  Lab ordered: Obtain B12 and folate level     4. Group and Therapy: -- Encouraged patient to participate in unit milieu and in scheduled group therapies    5. Discharge Planning:              -- Social work and case management to assist with discharge planning and identification of hospital follow-up needs prior to discharge             -- Estimated LOS: 5-7 days             -- Discharge Concerns: Need to establish a safety plan; Medication compliance and effectiveness             -- Discharge Goals: Return home with outpatient referrals for mental health follow-up including medication management/psychotherapy    I certify that inpatient services furnished can reasonably be expected to improve the patient's condition.   Jaleigh Mccroskey Abbott Pao, MD 06/25/2022, 3:12 PM

## 2022-06-25 NOTE — Progress Notes (Signed)
Pt observed pacing in room, whispering to self on initial encounter. Presents preoccupied, apprehensive and thought blocking with slow responses, suspicious and staring at walls in his room at intervals on initial interactions. Took his Abilify with increased verbal prompts "I don't trust this medication, I already took a injection, I took some medicines last night. Too much of one thing is not good especially with medicines". Preoccupied about discharge as well, had his bag packed, called mom multiple times to come get him. Safety checks maintained at Q 15 minutes intervals without self harm gestures /outburst. Verbal education provided on all medications and effects monitored. Emotional support, encouragement and reassurance provided to pt.  Pt required multiple prompts to attend groups and comply with unit routines. Tolerates lunch and fluids well. No outburst to note thus far.

## 2022-06-25 NOTE — Group Note (Signed)
Recreation Therapy Group Note   Group Topic:Animal Assisted Therapy   Group Date: 06/25/2022 Start Time: 1430 End Time: 1510 Facilitators: Caroll Rancher, LRT,CTRS Location: [address] Dayroom   Animal-Assisted Activity (AAA) Program Checklist/Progress Notes Patient Eligibility Criteria Checklist & Daily Group note for Rec Tx Intervention  AAA/T Program Assumption of Risk Form signed by Patient/ or Parent Legal Guardian Yes  Patient understands his/her participation is voluntary Yes   Affect/Mood: Appropriate   Participation Level: Engaged    Clinical Observations/Individualized Feedback:  Patient attended session and interacted appropriately with therapy dog and peers. Patient asked appropriate questions about therapy dog and his training. Patient shared stories about their pets at home with group.   Plan: Continue to engage patient in RT group sessions 2-3x/week.   Caroll Rancher, LRT,CTRS 06/25/2022 4:20 PM

## 2022-06-25 NOTE — H&P (Signed)
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  According to outside records, the patient presented to the behavioral health unit for [place] via [place] police voluntarily as a walk-in with complaint paranoia, auditory and visual hallucinations.  Initial assessment on 7/25, patient was evaluated on the inpatient unit, the patient reports no history of diagnosed mental illness, presents with thought blocking and disorganized thought process occasionally unable to answer in linear manner seems to be preoccupied with his paranoia and delusions, seems confused answering some questions for example he tells me he went to school up to seventh grade.  Per his father he finishes school and was about to start college.  Patient reports he is here "I called the cops brought me here I have threats losing my testicles getting castrated to be a woman, my testicles being pulled by a group of scientist in order not to have kids" she does report decreased sleep yet his father reported he has been sleeping average 6 to 8 hours a night, he does report decreased appetite.  His father reports patient having fair appetite in general, patient reports occasionally feeling suicidal but unable to describe any further details, his father reports patient never reported any thoughts of harming self or others.  Patient reports of intermittent visual hallucinations tells me that he hears voices of demons "telling me directions to go" he tells me that occasionally he hears voices telling him to harm himself but unable to specify further details "I keep  eating" he keeps getting disorganized during evaluation unable to remain linear.  He also reports visual hallucinations seeing people "rappers" then starts talking about "things taken away from me for the devil" he admits to getting messages from the TV "abnormal stuff cross upside down" then tells me that he is seeing "mist in the room" then asks the provider if I see it. He denies any history of trauma or symptoms consistent with PTSD, he denies any symptoms consistent with mania or hypomania.  Chart review: No previous psychiatric hospitalization or emergency room visits noted  Psych meds prior to admission: None at home, was started on Zyprexa and trazodone and urgent care  Sleep Sleep:Sleep: Fair   Collateral information: Spoke to patient's father over the phone who reports at age 63.  Patient started having heartburn and was diagnosed with GERD and was started on omeprazole at that time patient was noticed to be depressed and withdrawn then after few months when about to go to college he was noted to start having disorganized thoughts eating talking about a year from shadows and witchcraft and having increased spiritual thoughts.  In the beginning family stop this was a side effect of omeprazole.  Patient has been off omeprazole for over a year and according to the father psychotic symptoms noted above have been worsening gradually over the past year.  Father confirms no family history of schizophrenia.  Father does confirm above-noted information.  Patient in regard to hearing voices and seeing things as well as paranoia and delusions noted above.  Father concerned with no knowledge of patient reporting or having any thoughts to harm self or others.  Past Psychiatric History:  Prior Psychiatric diagnoses: None Past Psychiatric Hospitalizations: None  History of self mutilation: None Past suicide attempts: None Past history of HI, violent or aggressive behavior: None  Past Psychiatric  medications trials: None History of ECT/TMS: None  Outpatient psychiatric Follow up: None Prior Outpatient Therapy: None    Is the patient at risk to self? Yes.    Has the patient been a risk to self in the past 6 months? No.  Has the patient been a risk to self within the distant past? No.  Is the patient a risk to others? Yes.    Has the patient been a risk to others in the past 6 months? No.  Has the patient been a risk to others within the distant past? No.    Substance Use History: Patient denies any recent or past alcohol use or illicit drug use or abuse currently or in the past, nondrinker non-smoker, this was confirmed by patient's family.  Alcohol Screening: 1. How often do you have a drink containing alcohol?: Never 2. How many drinks containing alcohol do you have on a typical day when you are drinking?: 1 or 2 3. How often do you have six or more drinks on one occasion?: Never AUDIT-C Score: 0 4. How often during the last year have you found that you were not able to stop drinking once you had started?: Never 5. How often during the last year have you failed to do what was normally expected from you because of drinking?: Never 6. How often during the last year have you needed a first drink in the morning to get yourself going after a heavy drinking session?: Never 7. How often during the last year have you had a feeling of guilt of remorse after drinking?: Never 8. How often during the last year have you been unable to remember what happened the night before because you had been drinking?: Never 9. Have you or someone else been injured as a result of your drinking?: No 10. Has a relative or friend or a doctor or another health worker been concerned about your drinking or suggested you cut down?: No Alcohol Use Disorder Identification Test Final Score (AUDIT): 0  Substance Abuse History in the last 12 months:  No.   Tobacco Screening:     Past Medical/Surgical  History:  Past Medical History:  Diagnosis Date   Acid reflux    History reviewed. No pertinent surgical history.  Family History: History reviewed. No pertinent family history.  Family Psychiatric History:  Psychiatric illness: None reported Suicide: None reported Substance Abuse: None reported  Social History:  Social History   Substance and Sexual Activity  Alcohol Use Never     Social History   Substance and Sexual Activity  Drug Use Never    Living situation: Lives in [place] with his parents, 56 years old sister and 2 brothers ages 25 and 47 years old Social support: Family is supportive Marital Status: Never married Children: No children Education: Per father patient finished high school Employment: Immunologist: Denies Legal history: No pending charges or court dates Trauma: None reported Access to guns: The patient's father confirms patient has no access to guns at home   Allergies:   Allergies  Allergen Reactions   Omeprazole Other (See Comments)    Reaction unknown    Lab Results:  Results for orders placed or performed during the hospital encounter of 06/23/22 (from the past 48 hour(s))  Urinalysis, Routine w reflex microscopic Urine,  Clean Catch     Status: Abnormal   Collection Time: 06/23/22  3:06 PM  Result Value Ref Range   Color, Urine STRAW (A) YELLOW   APPearance CLEAR CLEAR   Specific Gravity, Urine 1.003 (L) 1.005 - 1.030   pH 7.0 5.0 - 8.0   Glucose, UA NEGATIVE NEGATIVE mg/dL   Hgb urine dipstick NEGATIVE NEGATIVE   Bilirubin Urine NEGATIVE NEGATIVE   Ketones, ur NEGATIVE NEGATIVE mg/dL   Protein, ur NEGATIVE NEGATIVE mg/dL   Nitrite NEGATIVE NEGATIVE   Leukocytes,Ua NEGATIVE NEGATIVE    Comment: Performed at [facility] [address], [place], [place] [address]  POCT Urine Drug Screen - (I-Screen)     Status: Normal   Collection Time: 06/23/22  3:06 PM  Result Value Ref Range   POC Amphetamine UR None  Detected NONE DETECTED (Cut Off Level 1000 ng/mL)   POC Secobarbital (BAR) None Detected NONE DETECTED (Cut Off Level 300 ng/mL)   POC Buprenorphine (BUP) None Detected NONE DETECTED (Cut Off Level 10 ng/mL)   POC Oxazepam (BZO) None Detected NONE DETECTED (Cut Off Level 300 ng/mL)   POC Cocaine UR None Detected NONE DETECTED (Cut Off Level 300 ng/mL)   POC Methamphetamine UR None Detected NONE DETECTED (Cut Off Level 1000 ng/mL)   POC Morphine None Detected NONE DETECTED (Cut Off Level 300 ng/mL)   POC Methadone UR None Detected NONE DETECTED (Cut Off Level 300 ng/mL)   POC Oxycodone UR None Detected NONE DETECTED (Cut Off Level 100 ng/mL)   POC Marijuana UR None Detected NONE DETECTED (Cut Off Level 50 ng/mL)    Blood Alcohol level:  Lab Results  Component Value Date   ETH <10 [id number]    Metabolic Disorder Labs:  Lab Results  Component Value Date   HGBA1C 5.1 06/23/2022   MPG 99.67 06/23/2022   No results found for: "PROLACTIN" Lab Results  Component Value Date   CHOL 164 06/23/2022   TRIG 32 06/23/2022   HDL 63 06/23/2022   CHOLHDL 2.6 06/23/2022   VLDL 6 06/23/2022   LDLCALC 95 06/23/2022    Current Medications: Current Facility-Administered Medications  Medication Dose Route Frequency Provider Last Rate Last Admin   acetaminophen (TYLENOL) tablet 650 mg  650 mg Oral Q6H PRN Rankin, Shuvon B, NP       alum & mag hydroxide-simeth (MAALOX/MYLANTA) 200-200-20 MG/5ML suspension 30 mL  30 mL Oral Q4H PRN Rankin, Shuvon B, NP       ARIPiprazole (ABILIFY) tablet 5 mg  5 mg Oral Daily Sun Wilensky, MD   5 mg at 06/25/22 1201   feeding supplement (ENSURE ENLIVE / ENSURE PLUS) liquid 237 mL  237 mL Oral BID BM Massengill, Ovid Curd, MD   237 mL at 06/25/22 0915   hydrOXYzine (ATARAX) tablet 25 mg  25 mg Oral TID PRN Dian Situ, MD       magnesium hydroxide (MILK OF MAGNESIA) suspension 30 mL  30 mL Oral Daily PRN Rankin, Shuvon B, NP       melatonin tablet 3 mg  3 mg Oral  QHS Onuoha, Chinwendu V, NP       traZODone (DESYREL) tablet 50 mg  50 mg Oral QHS PRN Rankin, Shuvon B, NP   50 mg at 06/24/22 2116    PTA Medications: Medications Prior to Admission  Medication Sig Dispense Refill Last Dose   ASHWAGANDHA PO Take 1 capsule by mouth in the morning and at bedtime.  hydrOXYzine (ATARAX) 25 MG tablet Take 1 tablet (25 mg total) by mouth 3 (three) times daily as needed for anxiety. 30 tablet 0    Multiple Vitamins-Minerals (ADULT GUMMY PO) Take 1 tablet by mouth daily.      neomycin-bacitracin-polymyxin (NEOSPORIN) [phone] ointment Apply 1 Application topically daily as needed (Apply to affected area).      OLANZapine zydis (ZYPREXA) 5 MG disintegrating tablet Take 1 tablet (5 mg total) by mouth at bedtime.      traZODone (DESYREL) 50 MG tablet Take 1 tablet (50 mg total) by mouth at bedtime as needed for sleep.       Musculoskeletal: Strength & Muscle Tone: within normal limits Gait & Station: normal Patient leans: N/A   Physical Findings: AIMS: Facial and Oral Movements Muscles of Facial Expression: None, normal Wilson and Perioral Area: None, normal Jaw: None, normal Tongue: None, normal,Extremity Movements Upper (arms, wrists, hands, fingers): None, normal Lower (legs, knees, ankles, toes): None, normal, Trunk Movements Neck, shoulders, hips: None, normal, Overall Severity Severity of abnormal movements (highest score from questions above): None, normal Incapacitation due to abnormal movements: None, normal Patient's awareness of abnormal movements (rate only patient's report): No Awareness, Dental Status Current problems with teeth and/or dentures?: No Does patient usually wear dentures?: No  CIWA:    COWS:     Psychiatric Specialty Exam:  General Appearance: Appears at stated age, dressed casually, fairly groomed  Behavior: Guarded and disorganized but cooperative in general  Psychomotor Activity:No psychomotor agitation mild to  moderate retardation noted    Eye Contact: Poor Speech: Decreased amount otherwise normal   Mood: Euthymic Affect: Restricted affect, occasionally laughing to himself inappropriately  Thought Process: Disorganized, thought blocking noted Descriptions of Associations: Concrete Thought Content: Hallucinations: Occasionally responding to stimuli during evaluation as he is hearing voices and reports seeing and mist as a room and asking this provider if he sees it.  Admits to [facility], [place] hearing voices of demons and seeing people's faces Delusions: Reports paranoia and delusions regarding his testicles being pulled out by a group of scientists for him to be castrated to become a woman and not to have kids Suicidal Thoughts: Primary admits to daily to suicidal ideation but when asked about details he denies SI, intention, plan  Homicidal Thoughts: Denies HI, intention, plan   Alertness/Orientation: Alert but partially oriented to person and place but not to time or situation  Insight: poor Judgment: poor  Memory: Limited confused about basic social history information likely dictation  Executive Functions  Concentration: Limited, easily distracted Attention Span: Poor Recall: Limited Fund of Knowledge: Limited   Physical Exam:  Physical Exam Constitutional:      Appearance: Normal appearance. He is normal weight.  HENT:     Head: Normocephalic and atraumatic.     Nose: Nose normal.  Eyes:     Pupils: Pupils are equal, round, and reactive to light.  Pulmonary:     Effort: Pulmonary effort is normal.  Musculoskeletal:        General: Normal range of motion.     Cervical back: Normal range of motion.  Neurological:     General: No focal deficit present.     Mental Status: He is alert.    Review of Systems  Constitutional: Negative.   HENT: Negative.    Respiratory: Negative.    Cardiovascular: Negative.   Gastrointestinal: Negative.   Genitourinary: Negative.    Neurological: Negative.    Blood pressure (!) 145/86, pulse 80, temperature  98.3 F (36.8 C), temperature source Oral, resp. rate 16, height 6\' 3"  (1.905 m), weight 81.6 kg, SpO2 100 %. Body mass index is 22.5 kg/m.   Assets  Assets:Communication Skills; Desire for Improvement; Financial Resources/Insurance; Housing; Resilience; Social Support    Treatment Plan Summary:  ASSESSMENT:  Principal Diagnosis: Schizophrenia ([place]) Diagnosis:  Principal Problem:   Schizophrenia ([place])   PLAN: Safety and Monitoring:  -- Voluntary admission to inpatient psychiatric unit for safety, stabilization and treatment  -- Daily contact with patient to assess and evaluate symptoms and progress in treatment  -- Patient's case to be discussed in multi-disciplinary team meeting  -- Observation Level : q15 minute checks  -- Vital signs:  q12 hours  -- Precautions: suicide, elopement, and assault  2. Medications:   Start Abilify 5 mg daily for psychosis, titrate to 10 mg in the next few days for efficacy.  If effective will consider Abilify Maintena injection monthly to improve long-term compliance.  Zyprexa p.o. or IM as needed for psychotic agitation, monitor need and use.  Vistaril as needed for anxiety  Trazodone as needed for sleep  The risks/benefits/side-effects/alternatives to this medication were discussed in detail with the patient and time was given for questions. The patient consents to medication trial.    -- Metabolic profile and EKG monitoring obtained while on an atypical antipsychotic (BMI: Lipid Panel: HbgA1c: QTc:)      3. Labs Reviewed: CMP within normal limits, lipid profile within normal limits, CBC within normal limits, hemoglobin A1c 5.1, TSH within normal limits UA no UTI noted, urine drug screen negative, CT head no abnormalities, EKG 7/23 QTc 370      Lab ordered: Obtain B12 and folate level   4. Group and Therapy: -- Encouraged patient to participate in unit milieu  and in scheduled group therapies   5. Discharge Planning:   -- Social work and case management to assist with discharge planning and identification of hospital follow-up needs prior to discharge  -- Estimated LOS: 5-7 days  -- Discharge Concerns: Need to establish a safety plan; Medication compliance and effectiveness  -- Discharge Goals: Return home with outpatient referrals for mental health follow-up including medication management/psychotherapy   The patient is agreeable with the medication plan, as above. We will monitor the patient's response to pharmacologic treatment, and adjust medications as necessary. Patient is encouraged to participate in group therapy while admitted to the psychiatric unit. We will address other chronic and acute stressors, which contributed to the patient's worsening psychosis, in order to reduce the risk of self-harm at discharge.   Physician Treatment Plan for Primary Diagnosis: Schizophrenia ([place]) Long Term Goal(s): Improvement in symptoms so as ready for discharge  Short Term Goals: Ability to identify changes in lifestyle to reduce recurrence of condition will improve, Ability to verbalize feelings will improve, Ability to disclose and discuss suicidal ideas, and Ability to demonstrate self-control will improve   I certify that inpatient services furnished can reasonably be expected to improve the patient's condition.    Total Time Spent in Direct Patient Care:  I personally spent 55 minutes on the unit in direct patient care. The direct patient care time included face-to-face time with the patient, reviewing the patient's chart, communicating with other professionals, and coordinating care. Greater than 50% of this time was spent in counseling or coordinating care with the patient regarding goals of hospitalization, psycho-education, and discharge planning needs.    Parrish Bonn Winfred Leeds, MD 7/25/20233:02 PM

## 2022-06-25 NOTE — Progress Notes (Signed)
Nutrition Brief Note RD working remotely.   Patient identified on the Malnutrition Screening Tool (MST) Report  Wt Readings from Last 15 Encounters:  06/24/22 81.6 kg  06/23/22 78 kg  04/12/21 75.8 kg (69 %, Z= 0.48)*   * Growth percentiles are based on CDC (Boys, 2-20 Years) data.    Body mass index is 22.5 kg/m. Patient meets criteria for normal weight based on current BMI.   Patient admitted for the management of worsening psychosis and paranoia.   Current diet order is Regular and patient is eating as desired at meals and snacks. Labs and medications reviewed.   Ensure ordered BID per ONS protocol at the time of admission; each supplement provides 350 kcal and 20 grams protein. No nutrition interventions warranted at this time. If nutrition issues arise, please consult RD.      Trenton Gammon, MS, RD, LDN, CNSC Registered Dietitian II Inpatient Clinical Nutrition RD pager # and on-call/weekend pager # available in [facility]

## 2022-06-25 NOTE — Progress Notes (Addendum)
   Pt present with anxiety and agitation.  Pt observed in group, an hallway, and on the phone with family.  Pt states, "family is very supportive."  Pt denies SI/HI, and verbally contracts for safety.  Pt continues to endorse AVH and appears to be responding to internal stimuli.  Administered PRN Hydroxyzine, Zyprexa, and Trazodone per [facility] per pt request.  Pt is safe on the unit with Q 15 minute safety checks.    06/25/22 2135  Psych Admission Type (Psych Patients Only)  Admission Status Voluntary  Psychosocial Assessment  Patient Complaints Agitation;Anxiety;Suspiciousness;Nervousness  Eye Contact Fair  Facial Expression Flat  Affect Appropriate to circumstance  Speech Soft  Interaction Cautious;Guarded;Minimal  Motor Activity Slow  Appearance/Hygiene Unremarkable  Behavior Characteristics Cooperative  Mood Anxious;Suspicious;Preoccupied;Pleasant  Thought Process  Coherency Blocking  Content Preoccupation  Delusions None reported or observed  Perception Hallucinations  Hallucination Auditory;Visual  Judgment Limited  Confusion Mild  Danger to Self  Current suicidal ideation? Denies  Danger to Others  Danger to Others None reported or observed  Danger to Others Abnormal  Harmful Behavior to others No threats or harm toward other people  Destructive Behavior No threats or harm toward property

## 2022-06-25 NOTE — Plan of Care (Signed)
?  Problem: Education: ?Goal: Emotional status will improve ?Outcome: Not Progressing ?Goal: Mental status will improve ?Outcome: Not Progressing ?  ?Problem: Activity: ?Goal: Sleeping patterns will improve ?Outcome: Not Progressing ?  ?

## 2022-06-25 NOTE — Progress Notes (Signed)
The patient rated his day as a 7 out of 10 and was thankful for the staff. States that he played basketball with his peers.

## 2022-06-26 ENCOUNTER — Encounter (HOSPITAL_COMMUNITY): Payer: Self-pay

## 2022-06-26 MED ORDER — ARIPIPRAZOLE 10 MG PO TABS
10.0000 mg | ORAL_TABLET | Freq: Every day | ORAL | Status: DC
  Administered 2022-06-27: 10 mg via ORAL
  Filled 2022-06-26 (×3): qty 1

## 2022-06-26 NOTE — Progress Notes (Signed)
Adult Psychoeducational Group Note  Date:  06/26/2022 Time:  4:30 PM  Group Topic/Focus:  Identifying Needs:   The focus of this group is to help patients identify their personal needs that have been historically problematic and identify healthy behaviors to address their needs.  Participation Level:  Active  Participation Quality:  Appropriate  Affect:  Appropriate  Cognitive:  Appropriate  Insight: Appropriate  Engagement in Group:  Engaged  Modes of Intervention:  Discussion  Additional Comments: The patient expressed that a positive change is being prepared to workout.  Joseph Wilson 06/26/2022, 4:30 PM

## 2022-06-26 NOTE — Progress Notes (Signed)
[facility] MD Progress Note  06/26/2022 12:02 PM Joseph Wilson  MRN:  [id number]   Reason for Admission:  Joseph Wilson is a 21 y.o., male with no past psychiatric history who presents to the [facility] from [facility] for evaluation and management of worsening psychosis and paranoia.  According to outside records, the patient presented to the behavioral health unit for [place] via [place] police voluntarily as a walk-in with complaint paranoia, auditory and visual hallucinations.The patient is currently on Hospital Day 2.   Chart Review from last 24 hours:  The patient's chart was reviewed and nursing notes were reviewed. The patient's case was discussed in multidisciplinary team meeting. Per MAR given Zyprexa Zydis 1 dose last night for psychotic agitation as well as Atarax as needed for anxiety, received trazodone for sleep for the past 2 nights.  Staff report patient continues to be disorganized and psychotic.  Information Obtained Today During Patient Interview: The patient was seen and evaluated on the unit. 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  He admits to hearing voices noting "I have a device in my brain helps me to hears things others cannot hear it is demonic isnt it" he denies side effect to current medication regimen and agrees to continue to comply while in the hospital.  Sleep  Sleep: Good, reported by patient  Principal Problem: Psychosis (HCC) Diagnosis: Principal Problem:   Psychosis (HCC)    Past Psychiatric History: Prior Psychiatric diagnoses: None Past Psychiatric Hospitalizations: None   History of self mutilation: None Past suicide attempts: None Past history of HI, violent or aggressive behavior: None   Past Psychiatric medications trials: None History of ECT/TMS: None   Outpatient psychiatric Follow up: None Prior Outpatient Therapy: None  Past Medical History:  Past Medical History:  Diagnosis Date   Acid reflux    History reviewed. No pertinent surgical history. Family History: History reviewed. No pertinent family history. Family Psychiatric  History: Psychiatric illness: None reported Suicide: None reported Substance Abuse: None reported Social History: Living situation: Lives in [place] with his parents, 15 years old sister and 2 brothers ages 65 and 9 years old Social support: Family is supportive Marital Status: Never married Children: No children Education: Per father patient finished high school Employment: Immunologist: Denies Legal history: No pending charges or court dates Trauma: None reported Access to guns: The patient's father confirms patient has no access to guns at home  Current Medications: Current Facility-Administered Medications  Medication Dose Route Frequency Provider Last Rate Last Admin   acetaminophen (TYLENOL) tablet 650 mg  650 mg Oral Q6H  PRN Rankin, Shuvon B, NP       alum & mag hydroxide-simeth (MAALOX/MYLANTA) 200-200-20 MG/5ML suspension 30 mL  30 mL Oral Q4H PRN Rankin, Shuvon B, NP       ARIPiprazole (ABILIFY) tablet 5 mg   5 mg Oral Daily Khyan Oats, MD   5 mg at 06/26/22 0948   feeding supplement (ENSURE ENLIVE / ENSURE PLUS) liquid 237 mL  237 mL Oral BID BM Massengill, Harrold Donath, MD   237 mL at 06/26/22 0948   hydrOXYzine (ATARAX) tablet 25 mg  25 mg Oral TID PRN Sarita Bottom, MD   25 mg at 06/25/22 2137   magnesium hydroxide (MILK OF MAGNESIA) suspension 30 mL  30 mL Oral Daily PRN Rankin, Shuvon B, NP       OLANZapine zydis (ZYPREXA) disintegrating tablet 5 mg  5 mg Oral Q6H PRN Abbott Pao, Amarri Michaelson, MD   5 mg at 06/25/22 2137   Or   OLANZapine (ZYPREXA) injection 5 mg  5 mg Intramuscular Q6H PRN Abbott Pao, Cheyanna Strick, MD       traZODone (DESYREL) tablet 50 mg  50 mg Oral QHS PRN Rankin, Shuvon B, NP   50 mg at 06/25/22 2137    Lab Results: No results found for this or any previous visit (from the past 48 hour(s)).  Blood Alcohol level:  Lab Results  Component Value Date   ETH <10 06/23/2022    Metabolic Disorder Labs: Lab Results  Component Value Date   HGBA1C 5.1 06/23/2022   MPG 99.67 06/23/2022   No results found for: "PROLACTIN" Lab Results  Component Value Date   CHOL 164 06/23/2022   TRIG 32 06/23/2022   HDL 63 06/23/2022   CHOLHDL 2.6 06/23/2022   VLDL 6 06/23/2022   LDLCALC 95 06/23/2022    Physical Findings: AIMS: Facial and Oral Movements Muscles of Facial Expression: None, normal Lips and Perioral Area: None, normal Jaw: None, normal Tongue: None, normal,Extremity Movements Upper (arms, wrists, hands, fingers): None, normal Lower (legs, knees, ankles, toes): None, normal, Trunk Movements Neck, shoulders, hips: None, normal, Overall Severity Severity of abnormal movements (highest score from questions above): None, normal Incapacitation due to abnormal movements: None, normal Patient's awareness of abnormal movements (rate only patient's report): No Awareness, Dental Status Current problems with teeth and/or dentures?: No Does patient usually wear dentures?: No  CIWA:    COWS:      Musculoskeletal: Strength & Muscle Tone: within normal limits Gait & Station: normal Patient leans: N/A  Psychiatric Specialty Exam: General Appearance: Appears at stated age, dressed casually, fairly groomed   Behavior: Guarded and disorganized but cooperative in general   Psychomotor Activity:No psychomotor agitation mild to moderate retardation noted      Eye Contact: Poor Speech: Decreased amount otherwise normal     Mood: Euthymic Affect: Restricted affect, occasionally laughing to himself inappropriately   Thought Process: Disorganized, thought blocking noted Descriptions of Associations: Concrete Thought Content: Hallucinations: Occasionally responding to stimuli during evaluation and admits to AVH when asked but vague to describe details. Delusions: Reports paranoia and delusions  People are trying to get him into the hospital Suicidal Thoughts: Denies SI intention or plan Homicidal Thoughts: Denies HI, intention, plan    Alertness/Orientation: Alert but partially oriented to person and place but not to time or situation   Insight: poor Judgment: poor   Memory: Adult nurse  Concentration: Limited, easily distracted Attention Span: Poor Recall: Limited Fund of Knowledge: Limited Assets  Assets:Communication Skills;  Desire for Improvement; Financial Resources/Insurance; Housing; Resilience; Social Support    Physical Exam: Physical Exam ROS Blood pressure 130/80, pulse 91, temperature 98.3 F (36.8 C), temperature source Oral, resp. rate 16, height 6\' 3"  (1.905 m), weight 81.6 kg, SpO2 100 %. Body mass index is 22.5 kg/m.   Treatment Plan Summary:  ASSESSMENT:  Diagnoses / Active Problems: Principal Problem: Psychosis (HCC) Diagnosis: Principal Problem:   Psychosis (HCC)   PLAN: Safety and Monitoring:             -- Voluntary admission to inpatient psychiatric unit for safety, stabilization and treatment             --  Daily contact with patient to assess and evaluate symptoms and progress in treatment             -- Patient's case to be discussed in multi-disciplinary team meeting             -- Observation Level : q15 minute checks             -- Vital signs:  q12 hours             -- Precautions: suicide, elopement, and assault   2. Medications:              Titrate Abilify to 10 mg daily to address psychosis and paranoia, monitor efficacy and safety.  If effective will consider Abilify Maintena injection monthly to improve long-term compliance.             Zyprexa p.o. or IM as needed for psychotic agitation, monitor need and use.             Vistaril as needed for anxiety             Trazodone as needed for sleep  The risks/benefits/side-effects/alternatives to this medication were discussed in detail with the patient and time was given for questions. The patient consents to medication trial.                -- Metabolic profile and EKG monitoring obtained while on an atypical antipsychotic (BMI: Lipid Panel: HbgA1c: QTc:)                            3. Labs Reviewed: CMP within normal limits, lipid profile within normal limits, CBC within normal limits, hemoglobin A1c 5.1, TSH within normal limits UA no UTI noted, urine drug screen negative, CT head no abnormalities, EKG 7/23 QTc 370        Lab ordered: B12 and folate level pending     4. Group and Therapy: -- Encouraged patient to participate in unit milieu and in scheduled group therapies    5. Discharge Planning:              -- Social work and case management to assist with discharge planning and identification of hospital follow-up needs prior to discharge             -- Estimated LOS: 5-7 days             -- Discharge Concerns: Need to establish a safety plan; Medication compliance and effectiveness             -- Discharge Goals: Return home with outpatient referrals for mental health follow-up including medication management/psychotherapy      Total Time Spent in Direct Patient Care:  I personally spent 35 minutes on the unit in direct  patient care. The direct patient care time included face-to-face time with the patient, reviewing the patient's chart, communicating with other professionals, and coordinating care. Greater than 50% of this time was spent in counseling or coordinating care with the patient regarding goals of hospitalization, psycho-education, and discharge planning needs.   Shaquavia Whisonant Abbott Pao, MD 06/26/2022, 12:02 PM

## 2022-06-26 NOTE — Group Note (Signed)
LCSW Group Therapy Note   Group Date: 06/26/2022 Start Time: 1300 End Time: 1400  Type of Therapy and Topic: Group Packets; Self-Esteem   Participation Level:  Active  Due to the acuity and complex discharge plans, group was not held. Patient was provided therapeutic worksheets and asked to meet with CSW as needed.  Bettyjane Shenoy M Redonna Wilbert, LCSWA 06/26/2022  1:25 PM    

## 2022-06-26 NOTE — Progress Notes (Signed)
Pt attended NA group

## 2022-06-26 NOTE — Group Note (Signed)
Recreation Therapy Group Note   Group Topic:Stress Management  Group Date: 06/26/2022 Start Time: 0935 End Time: 0955 Facilitators: Caroll Rancher, LRT,CTRS Location: [address] Dayroom   Goal Area(s) Addresses:  Patient will actively participate in stress management techniques presented during session.  Patient will successfully identify benefit of practicing stress management post d/c.   Group Description: Guided Imagery. LRT provided education, instruction, and demonstration on practice of visualization via guided imagery. Patient was asked to participate in the technique introduced during session. LRT debriefed including topics of mindfulness, stress management and specific scenarios each patient could use these techniques. Patients were given suggestions of ways to access scripts post d/c and encouraged to explore Youtube and other apps available on smartphones, tablets, and computers.   Affect/Mood: N/A   Participation Level: Did not attend    Clinical Observations/Individualized Feedback:     Plan: Continue to engage patient in RT group sessions 2-3x/week.   Caroll Rancher, Antonietta Jewel 06/26/2022 12:46 PM

## 2022-06-26 NOTE — Plan of Care (Signed)
Patient is alert but disorganized and noted to thought block. Patient cooperative and redirectable. Patient stated he slept well last night and rated his depression a 3. Anxiety a 0. Patient denies SI but endorses auditory/visual hallucinations. Patient stated he sees "shadow people" at times and stated he did not want anymore zyprexa because he thinks it might kill him. He stated it made him feel heart palpitations yesterday and was suspicious when taking his medications in the morning. Patient observed walking backwards into room today and appears confused at times having to be redirected. Patient appeared focused on wanting to know his CT and MRI results and stated he would ask MD tomorrow morning. Patient denies pain and absent from injuries/aggressive behavior.   Problem: Safety: Goal: Periods of time without injury will increase Outcome: Progressing   Problem: Health Behavior/Discharge Planning: Goal: Compliance with prescribed medication regimen will improve Outcome: Progressing
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
## 2022-06-26 NOTE — BH IP Treatment Plan (Signed)
Interdisciplinary Treatment and Diagnostic Plan Update  06/26/2022 Time of Session: 9:35am  Joseph Wilson MRN: [id number]  Principal Diagnosis: Psychosis [facility])  Secondary Diagnoses: Principal Problem:   Psychosis ([place])   Current Medications:  Current Facility-Administered Medications  Medication Dose Route Frequency Provider Last Rate Last Admin   acetaminophen (TYLENOL) tablet 650 mg  650 mg Oral Q6H PRN Rankin, Shuvon B, NP       alum & mag hydroxide-simeth (MAALOX/MYLANTA) 200-200-20 MG/5ML suspension 30 mL  30 mL Oral Q4H PRN Rankin, Shuvon B, NP       [START ON 06/27/2022] ARIPiprazole (ABILIFY) tablet 10 mg  10 mg Oral Daily Attiah, Nadir, MD       feeding supplement (ENSURE ENLIVE / ENSURE PLUS) liquid 237 mL  237 mL Oral BID BM Massengill, Nathan, MD   237 mL at 06/26/22 0948   hydrOXYzine (ATARAX) tablet 25 mg  25 mg Oral TID PRN Dian Situ, MD   25 mg at 06/25/22 2137   magnesium hydroxide (MILK OF MAGNESIA) suspension 30 mL  30 mL Oral Daily PRN Rankin, Shuvon B, NP       OLANZapine zydis (ZYPREXA) disintegrating tablet 5 mg  5 mg Oral Q6H PRN Winfred Leeds, Nadir, MD   5 mg at 06/25/22 2137   Or   OLANZapine (ZYPREXA) injection 5 mg  5 mg Intramuscular Q6H PRN Winfred Leeds, Nadir, MD       traZODone (DESYREL) tablet 50 mg  50 mg Oral QHS PRN Rankin, Shuvon B, NP   50 mg at 06/25/22 2137   PTA Medications: Medications Prior to Admission  Medication Sig Dispense Refill Last Dose   ASHWAGANDHA PO Take 1 capsule by mouth in the morning and at bedtime.      hydrOXYzine (ATARAX) 25 MG tablet Take 1 tablet (25 mg total) by mouth 3 (three) times daily as needed for anxiety. 30 tablet 0    Multiple Vitamins-Minerals (ADULT GUMMY PO) Take 1 tablet by mouth daily.      neomycin-bacitracin-polymyxin (NEOSPORIN) [phone] ointment Apply 1 Application topically daily as needed (Apply to affected area).      OLANZapine zydis (ZYPREXA) 5 MG disintegrating tablet Take 1 tablet (5 mg total) by  mouth at bedtime.      traZODone (DESYREL) 50 MG tablet Take 1 tablet (50 mg total) by mouth at bedtime as needed for sleep.       Patient Stressors: Other: psychosis    Patient Strengths: Average or above average intelligence  Communication skills  Motivation for treatment/growth  Physical Health  Religious Affiliation  Supportive family/friends   Treatment Modalities: Medication Management, Group therapy, Case management,  1 to 1 session with clinician, Psychoeducation, Recreational therapy.   Physician Treatment Plan for Primary Diagnosis: Psychosis ([place]) Long Term Goal(s): Improvement in symptoms so as ready for discharge   Short Term Goals: Ability to identify changes in lifestyle to reduce recurrence of condition will improve Ability to verbalize feelings will improve Ability to disclose and discuss suicidal ideas Ability to demonstrate self-control will improve  Medication Management: Evaluate patient's response, side effects, and tolerance of medication regimen.  Therapeutic Interventions: 1 to 1 sessions, Unit Group sessions and Medication administration.  Evaluation of Outcomes: Not Met  Physician Treatment Plan for Secondary Diagnosis: Principal Problem:   Psychosis ([place])  Long Term Goal(s): Improvement in symptoms so as ready for discharge   Short Term Goals: Ability to identify changes in lifestyle to reduce recurrence of condition will improve Ability to verbalize feelings will  improve Ability to disclose and discuss suicidal ideas Ability to demonstrate self-control will improve     Medication Management: Evaluate patient's response, side effects, and tolerance of medication regimen.  Therapeutic Interventions: 1 to 1 sessions, Unit Group sessions and Medication administration.  Evaluation of Outcomes: Not Met   RN Treatment Plan for Primary Diagnosis: Psychosis ([place]) Long Term Goal(s): Knowledge of disease and therapeutic regimen to maintain health  will improve  Short Term Goals: Ability to remain free from injury will improve, Ability to participate in decision making will improve, Ability to verbalize feelings will improve, Ability to disclose and discuss suicidal ideas, and Ability to identify and develop effective coping behaviors will improve  Medication Management: RN will administer medications as ordered by provider, will assess and evaluate patient's response and provide education to patient for prescribed medication. RN will report any adverse and/or side effects to prescribing provider.  Therapeutic Interventions: 1 on 1 counseling sessions, Psychoeducation, Medication administration, Evaluate responses to treatment, Monitor vital signs and CBGs as ordered, Perform/monitor CIWA, COWS, AIMS and Fall Risk screenings as ordered, Perform wound care treatments as ordered.  Evaluation of Outcomes: Not Met   LCSW Treatment Plan for Primary Diagnosis: Psychosis ([place]) Long Term Goal(s): Safe transition to appropriate next level of care at discharge, Engage patient in therapeutic group addressing interpersonal concerns.  Short Term Goals: Engage patient in aftercare planning with referrals and resources, Increase social support, Increase emotional regulation, Facilitate acceptance of mental health diagnosis and concerns, Identify triggers associated with mental health/substance abuse issues, and Increase skills for wellness and recovery  Therapeutic Interventions: Assess for all discharge needs, 1 to 1 time with Social worker, Explore available resources and support systems, Assess for adequacy in community support network, Educate family and significant other(s) on suicide prevention, Complete Psychosocial Assessment, Interpersonal group therapy.  Evaluation of Outcomes: Not Met   Progress in Treatment: Attending groups: Yes. Participating in groups: Yes. Taking medication as prescribed: Yes. Toleration medication:  Yes. Family/Significant other contact made: Yes, individual(s) contacted:  Father  Patient understands diagnosis: No. Discussing patient identified problems/goals with staff: Yes. Medical problems stabilized or resolved: Yes. Denies suicidal/homicidal ideation: Yes. Issues/concerns per patient self-inventory: No.   New problem(s) identified: No, Describe:  None   New Short Term/Long Term Goal(s): medication stabilization, elimination of SI thoughts, development of comprehensive mental wellness plan.   Patient Goals: "To recover mentally"   Discharge Plan or Barriers: Patient recently admitted. CSW will continue to follow and assess for appropriate referrals and possible discharge planning.   Reason for Continuation of Hospitalization: Anxiety Hallucinations Medication stabilization  Estimated Length of Stay: 3 to 7 days   Last 3 [place] Suicide Severity Risk Score: [place] Admission (Current) from 06/24/2022 in [place] [facility] ED from 06/23/2022 in [place] No Risk No Risk       Last PHQ 2/9 Scores:    06/23/2022    9:44 AM  Depression screen PHQ 2/9  Decreased Interest 0  Down, Depressed, Hopeless 0  PHQ - 2 Score 0    Scribe for Treatment Team: Carney Harder 06/26/2022 2:37 PM

## 2022-06-27 MED ORDER — ARIPIPRAZOLE 15 MG PO TABS
15.0000 mg | ORAL_TABLET | Freq: Every day | ORAL | Status: DC
  Administered 2022-06-28 – 2022-06-29 (×2): 15 mg via ORAL
  Filled 2022-06-27 (×4): qty 1

## 2022-06-27 MED ORDER — ARIPIPRAZOLE 5 MG PO TABS
5.0000 mg | ORAL_TABLET | Freq: Once | ORAL | Status: AC
  Administered 2022-06-27: 5 mg via ORAL
  Filled 2022-06-27: qty 1

## 2022-06-27 NOTE — Progress Notes (Signed)
Pt. Rated his day as a 7 out of 10 since he ate well today. He also mentioned that he is grateful for his peers on the hallway.

## 2022-06-27 NOTE — Plan of Care (Signed)

## 2022-06-27 NOTE — [facility] Suicide Risk Assessment (Signed)
[facility] INPATIENT:  Family/Significant Other Suicide Prevention Education  Suicide Prevention Education:  Education Completed; Altyreit (father) -[phone],  (name of family member/significant other) has been identified by the patient as the family member/significant other with whom the patient will be residing, and identified as the person(s) who will aid the patient in the event of a mental health crisis (suicidal ideations/suicide attempt).  With written consent from the patient, the family member/significant other has been provided the following suicide prevention education, prior to the and/or following the discharge of the patient.  CSW spoke with patient father who reports that patient has always had stomach issues and he started to take a medication and then developed psychosis symptoms in 2021. They have been to several different doctors including GI doctor, neurologist, and his primary care but they haven't been able to figure out what has been going on.  Patient in the past 2 weeks has been talking about "something pulling him down" and has been more withdrawn.  No guns/weapons in the house and patient can return to stay with mom and dad.  No additional safety concerns.   The suicide prevention education provided includes the following: Suicide risk factors Suicide prevention and interventions National Suicide Hotline telephone number [facility] assessment telephone number [facility] Emergency Assistance 911 [facility] and/or Residential Mobile Crisis Unit telephone number  Request made of family/significant other to: Remove weapons (e.g., guns, rifles, knives), all items previously/currently identified as safety concern.   Remove drugs/medications (over-the-counter, prescriptions, illicit drugs), all items previously/currently identified as a safety concern.  The family member/significant other verbalizes understanding of the suicide prevention education  information provided.  The family member/significant other agrees to remove the items of safety concern listed above.  Mozes Sagar E Ashla Murph 06/27/2022, 1:19 PM

## 2022-06-27 NOTE — Group Note (Signed)
Date:  06/27/2022 Time:  3:19 PM  Group Topic/Focus:  Wellness Toolbox:   The focus of this group is to discuss various aspects of wellness, balancing those aspects and exploring ways to increase the ability to experience wellness.  Patients will create a wellness toolbox for use upon discharge.    Participation Level:  Active  Participation Quality:  Appropriate  Affect:  Appropriate  Cognitive:  Appropriate  Insight: Appropriate  Engagement in Group:  Engaged  Modes of Intervention:  Discussion  Additional Comments:   Patient participated in group today.   Reymundo Poll 06/27/2022, 3:19 PM

## 2022-06-27 NOTE — Progress Notes (Signed)
[facility] MD Progress Note  06/27/2022 1:16 PM Joseph Wilson  MRN:  [id number]   Reason for Admission:  Joseph Wilson is a 21 y.o., male with no past psychiatric history who presents to the [facility] from [facility] for evaluation and management of worsening psychosis and paranoia.  According to outside records, the patient presented to the behavioral health unit for [place] via [place] police voluntarily as a walk-in with complaint paranoia, auditory and visual hallucinations.The patient is currently on Hospital Day 3.   Chart Review from last 24 hours:  The patient's chart was reviewed and nursing notes were reviewed. The patient's case was discussed in multidisciplinary team meeting. Per MAR given Zyprexa Zydis, Atarax and trazodone last night, per staff disease medications where given for racing thoughts and psychosis Information Obtained Today During Patient Interview: The patient was seen and evaluated on the unit. On assessment today the patient continues to present with disorganized thought process noting hearing the voice of the devil also reports paranoia from the devil then thoughts about demonic flies going into people's body "astroprojection" he does report thought broadcasting and thought insertion but appears paranoid "I would not tell you who does he stop this will be back stabbing them and they will harm me" he denies auditory or visual hallucinations when asked later during evaluation, presents with thought blocking and disorganized thought process, presents paranoid and delusional.  Denies active SI intention or plan, denies HI, reports good sleep and fair appetite.  Sleep  Sleep: Good, reported by patient  Principal Problem: Psychosis (HCC) Diagnosis: Principal Problem:   Psychosis (HCC)    Past Psychiatric History: Prior Psychiatric diagnoses: None Past Psychiatric Hospitalizations: None   History of self mutilation: None Past  suicide attempts: None Past history of HI, violent or aggressive behavior: None   Past Psychiatric medications trials: None History of ECT/TMS: None   Outpatient psychiatric Follow up: None Prior Outpatient Therapy: None  Past Medical History:  Past Medical History:  Diagnosis Date   Acid reflux    History reviewed. No pertinent surgical history. Family History: History reviewed. No pertinent family history. Family Psychiatric  History: Psychiatric illness: None reported Suicide: None reported Substance Abuse: None reported Social History: Living situation: Lives in [place] with his parents, 26 years old sister and 2 brothers ages 86 and 47 years old Social support: Family is supportive Marital Status: Never married Children: No children Education: Per father patient finished high school Employment: Immunologist: Denies Legal history: No pending charges or court dates Trauma: None reported Access to guns: The patient's father confirms patient has no access to guns at home  Current Medications: Current Facility-Administered Medications  Medication Dose Route Frequency Provider Last Rate Last Admin   acetaminophen (TYLENOL) tablet 650 mg  650 mg Oral Q6H PRN Rankin, Shuvon B, NP       alum & mag hydroxide-simeth (MAALOX/MYLANTA) 200-200-20 MG/5ML suspension 30 mL  30 mL Oral Q4H PRN Rankin, Shuvon B, NP       ARIPiprazole (ABILIFY) tablet 10 mg  10 mg Oral Daily Jayme Mednick, MD   10 mg at 06/27/22 0858   feeding supplement (ENSURE ENLIVE / ENSURE PLUS) liquid 237 mL  237 mL Oral BID BM Massengill, Nathan, MD   237 mL at 06/27/22 1147   hydrOXYzine (ATARAX) tablet 25 mg  25 mg Oral TID PRN Sarita Bottom, MD   25 mg at 06/26/22 2109   magnesium hydroxide (MILK OF MAGNESIA) suspension  30 mL  30 mL Oral Daily PRN Rankin, Shuvon B, NP       OLANZapine zydis (ZYPREXA) disintegrating tablet 5 mg  5 mg Oral Q6H PRN Abbott Pao, Levan Aloia, MD   5 mg at 06/26/22 2109   Or    OLANZapine (ZYPREXA) injection 5 mg  5 mg Intramuscular Q6H PRN Abbott Pao, Keriana Sarsfield, MD       traZODone (DESYREL) tablet 50 mg  50 mg Oral QHS PRN Rankin, Shuvon B, NP   50 mg at 06/26/22 2109    Lab Results: No results found for this or any previous visit (from the past 48 hour(s)).  Blood Alcohol level:  Lab Results  Component Value Date   ETH <10 06/23/2022    Metabolic Disorder Labs: Lab Results  Component Value Date   HGBA1C 5.1 06/23/2022   MPG 99.67 06/23/2022   No results found for: "PROLACTIN" Lab Results  Component Value Date   CHOL 164 06/23/2022   TRIG 32 06/23/2022   HDL 63 06/23/2022   CHOLHDL 2.6 06/23/2022   VLDL 6 06/23/2022   LDLCALC 95 06/23/2022    Physical Findings: AIMS: Facial and Oral Movements Muscles of Facial Expression: None, normal Lips and Perioral Area: None, normal Jaw: None, normal Tongue: None, normal,Extremity Movements Upper (arms, wrists, hands, fingers): None, normal Lower (legs, knees, ankles, toes): None, normal, Trunk Movements Neck, shoulders, hips: None, normal, Overall Severity Severity of abnormal movements (highest score from questions above): None, normal Incapacitation due to abnormal movements: None, normal Patient's awareness of abnormal movements (rate only patient's report): No Awareness, Dental Status Current problems with teeth and/or dentures?: No Does patient usually wear dentures?: No  CIWA:    COWS:     Musculoskeletal: Strength & Muscle Tone: within normal limits Gait & Station: normal Patient leans: N/A  Psychiatric Specialty Exam: General Appearance: Appears at stated age, dressed casually, fairly groomed   Behavior: Guarded and disorganized but cooperative in general   Psychomotor Activity:No psychomotor agitation mild to moderate retardation noted      Eye Contact: Poor Speech: Decreased amount otherwise normal     Mood: Euthymic Affect: Restricted affect, occasionally laughing to himself  inappropriately   Thought Process: Disorganized, thought blocking noted Descriptions of Associations: Concrete Thought Content: Hallucinations: Reports related hallucinations hearing the voices devil later denies AVH Delusions: Reports paranoia and delusions  People are trying to harm him Suicidal Thoughts: Denies SI intention or plan Homicidal Thoughts: Denies HI, intention, plan    Alertness/Orientation: Alert but partially oriented to person and place but not to time or situation   Insight: poor Judgment: poor   Memory: Adult nurse  Concentration: Limited, easily distracted Attention Span: Poor Recall: Limited Fund of Knowledge: Limited Assets  Assets:Communication Skills; Desire for Improvement; Financial Resources/Insurance; Housing; Resilience; Social Support    Physical Exam: Physical Exam Review of Systems  Constitutional: Negative.   Respiratory: Negative.    Cardiovascular: Negative.   Gastrointestinal: Negative.   Genitourinary: Negative.   Musculoskeletal: Negative.   Neurological: Negative.    Blood pressure 134/83, pulse 92, temperature 98.5 F (36.9 C), temperature source Oral, resp. rate 16, height 6\' 3"  (1.905 m), weight 81.6 kg, SpO2 100 %. Body mass index is 22.5 kg/m.   Treatment Plan Summary:  ASSESSMENT:  Diagnoses / Active Problems: Principal Problem: Psychosis (HCC) Diagnosis: Principal Problem:   Psychosis (HCC)   PLAN: Safety and Monitoring:             -- Voluntary  admission to inpatient psychiatric unit for safety, stabilization and treatment             -- Daily contact with patient to assess and evaluate symptoms and progress in treatment             -- Patient's case to be discussed in multi-disciplinary team meeting             -- Observation Level : q15 minute checks             -- Vital signs:  q12 hours             -- Precautions: suicide, elopement, and assault   2. Medications:               Titrate Abilify to 15 mg daily to address psychosis and paranoia, monitor efficacy and safety.  If effective will consider Abilify Maintena injection monthly to improve long-term compliance.  If Abilify is not effective after titration, will consider switching to scheduled Zyprexa twice daily.             Zyprexa p.o. or IM as needed for psychotic agitation, monitor need and use.             Vistaril as needed for anxiety             Trazodone as needed for sleep  The risks/benefits/side-effects/alternatives to this medication were discussed in detail with the patient and time was given for questions. The patient consents to medication trial.                -- Metabolic profile and EKG monitoring obtained while on an atypical antipsychotic (BMI: Lipid Panel: HbgA1c: QTc:)                            3. Labs Reviewed: CMP within normal limits, lipid profile within normal limits, CBC within normal limits, hemoglobin A1c 5.1, TSH within normal limits UA no UTI noted, urine drug screen negative, CT head no abnormalities, EKG 7/23 QTc 370        Lab ordered: B12 and folate level pending     4. Group and Therapy: -- Encouraged patient to participate in unit milieu and in scheduled group therapies    5. Discharge Planning:              -- Social work and case management to assist with discharge planning and identification of hospital follow-up needs prior to discharge             -- Estimated LOS: 5-7 days             -- Discharge Concerns: Need to establish a safety plan; Medication compliance and effectiveness             -- Discharge Goals: Return home with outpatient referrals for mental health follow-up including medication management/psychotherapy     Total Time Spent in Direct Patient Care:  I personally spent 35 minutes on the unit in direct patient care. The direct patient care time included face-to-face time with the patient, reviewing the patient's chart, communicating with other  professionals, and coordinating care. Greater than 50% of this time was spent in counseling or coordinating care with the patient regarding goals of hospitalization, psycho-education, and discharge planning needs.   Jeffory Snelgrove Abbott Pao, MD 06/27/2022, 1:16 PM

## 2022-06-27 NOTE — Group Note (Signed)
Date:  06/27/2022 Time:  3:49 PM  Group Topic/Focus:  Self Care:   The focus of this group is to help patients understand the importance of self-care in order to improve or restore emotional, physical, spiritual, interpersonal, and financial health.    Participation Level:  Active  Participation Quality:  Appropriate  Affect:  Appropriate  Cognitive:  Appropriate  Insight: Appropriate  Engagement in Group:  Engaged  Modes of Intervention:  Discussion  Additional Comments:   Patient was engaged in group.  Reymundo Poll 06/27/2022, 3:49 PM

## 2022-06-27 NOTE — Progress Notes (Signed)
   06/27/22 2300  Psych Admission Type (Psych Patients Only)  Admission Status Voluntary  Psychosocial Assessment  Patient Complaints Agitation  Eye Contact Fair  Facial Expression Pensive  Affect Preoccupied  Speech Soft  Interaction Cautious  Motor Activity Slow  Appearance/Hygiene Unremarkable  Behavior Characteristics Cooperative  Mood Suspicious  Thought Process  Coherency Blocking  Content Preoccupation  Delusions Paranoid  Perception Hallucinations  Hallucination Visual;Auditory  Judgment Poor  Confusion None  Danger to Self  Current suicidal ideation? Denies (Denies)  Danger to Others  Danger to Others None reported or observed

## 2022-06-27 NOTE — Group Note (Signed)
Date:  06/27/2022 Time:  3:22 PM  Group Topic/Focus:  Orientation:   The focus of this group is to educate the patient on the purpose and policies of crisis stabilization and provide a format to answer questions about their admission.  The group details unit policies and expectations of patients while admitted.    Participation Level:  Active  Participation Quality:  Appropriate  Affect:  Anxious  Cognitive:  Appropriate  Insight: Appropriate  Engagement in Group:  Engaged  Modes of Intervention:  Discussion  Additional Comments:     Reymundo Poll 06/27/2022, 3:22 PM

## 2022-06-27 NOTE — Progress Notes (Signed)
     06/26/22 2109  Psych Admission Type (Psych Patients Only)  Admission Status Voluntary  Psychosocial Assessment  Patient Complaints Agitation;Worrying;Anxiety;Suspiciousness  Eye Contact Fair  Facial Expression Pensive  Affect Preoccupied  Speech Soft  Interaction Cautious;Guarded  Motor Activity Slow  Appearance/Hygiene Unremarkable  Behavior Characteristics Cooperative;Anxious  Mood Suspicious;Preoccupied  Thought Process  Coherency Blocking  Content Paranoia;Preoccupation  Delusions Paranoid  Perception Hallucinations  Hallucination Auditory;Visual  Judgment Poor  Confusion None  Danger to Self  Current suicidal ideation? Denies  Danger to Others  Danger to Others None reported or observed  Danger to Others Abnormal  Harmful Behavior to others No threats or harm toward other people

## 2022-06-27 NOTE — Plan of Care (Signed)
  Problem: Education: Goal: Emotional status will improve Outcome: Not Progressing Goal: Mental status will improve Outcome: Not Progressing   

## 2022-06-28 MED ORDER — OLANZAPINE 10 MG IM SOLR
5.0000 mg | Freq: Four times a day (QID) | INTRAMUSCULAR | Status: DC | PRN
Start: 2022-06-28 — End: 2022-07-03

## 2022-06-28 MED ORDER — TRAZODONE HCL 150 MG PO TABS
150.0000 mg | ORAL_TABLET | Freq: Every evening | ORAL | Status: DC | PRN
  Administered 2022-06-29 – 2022-07-02 (×4): 150 mg via ORAL
  Filled 2022-06-28 (×5): qty 1

## 2022-06-28 MED ORDER — OLANZAPINE 5 MG PO TBDP: 5.0000 mg | ORAL_TABLET | Freq: Four times a day (QID) | ORAL | Status: DC | PRN

## 2022-06-28 NOTE — Group Note (Deleted)
LCSW Group Therapy Note   Group Date: 06/28/2022 Start Time: 1300 End Time: 1400   Type of Therapy and Topic:  Group Therapy:   Participation Level:  {BHH PARTICIPATION [id number]:30092}  Description of Group:   Therapeutic Goals:  1.     Summary of Patient Progress:    ***  Therapeutic Modalities:   Beatris Si, LCSWA 06/28/2022  1:54 PM

## 2022-06-28 NOTE — Progress Notes (Addendum)
[facility] MD Progress Note  06/28/2022 3:02 PM Joseph Wilson  MRN:  [id number]   Reason for Admission:  Joseph Wilson is a 21 y.o., male with no past psychiatric history who presents to the [facility] from [facility] for evaluation and management of worsening psychosis and paranoia.  According to outside records, the patient presented to the behavioral health unit for [place] via [place] police voluntarily as a walk-in with complaint paranoia, auditory and visual hallucinations.The patient is currently on Hospital Day 4.   Chart Review from last 24 hours:  The patient's chart was reviewed and nursing notes were reviewed. The patient's case was discussed in multidisciplinary team meeting. Per MAR given Zyprexa Zydis, for the past few nights but per staff it was given for racing thoughts and inability to sleep rather than agitation.    Information Obtained Today During Patient Interview: The patient was seen and evaluated on the unit. On assessment today patient continues to present with limited eye contact and disorganized thought process but seems to be slightly less preoccupied with his paranoia and delusions he remains paranoid primarily refusing to elaborate but later tells me that he is fearful from others both inside and outside of the hospital "trying to make me commit suicide during witchcraft to me" he denies active SI intention or plan, denies HI, continues to report day-to-day hallucinations, continues to be delusional "I had the device in my head to hear stuff" he tells me he sees beams of light and mist coming out of him sometime and also sees demons "like black cloud" he seems to be less preoccupied with delusional he had earlier during this hospitalization or being castrated.  He denies side effect from medications and has been compliant with his scheduled medications with no problems.  He tells me that he did not go to college "for sanctification and  Christianity I needed to focus on it more"  I spoke to patient's father over the phone today who reported he plans to visit the patient later today, discussed with patient's father current to progress and treatment plan, will follow on Sunday or Monday after he visits with the patient. Sleep  Sleep: Good, reported by patient  Principal Problem: Psychosis (HCC) Diagnosis: Principal Problem:   Psychosis (HCC)    Past Psychiatric History: Prior Psychiatric diagnoses: None Past Psychiatric Hospitalizations: None   History of self mutilation: None Past suicide attempts: None Past history of HI, violent or aggressive behavior: None   Past Psychiatric medications trials: None History of ECT/TMS: None   Outpatient psychiatric Follow up: None Prior Outpatient Therapy: None  Past Medical History:  Past Medical History:  Diagnosis Date   Acid reflux    History reviewed. No pertinent surgical history. Family History: History reviewed. No pertinent family history. Family Psychiatric  History: Psychiatric illness: None reported Suicide: None reported Substance Abuse: None reported Social History: Living situation: Lives in [place] with his parents, 40 years old sister and 2 brothers ages 75 and 60 years old Social support: Family is supportive Marital Status: Never married Children: No children Education: Per father patient finished high school Employment: Immunologist: Denies Legal history: No pending charges or court dates Trauma: None reported Access to guns: The patient's father confirms patient has no access to guns at home  Current Medications: Current Facility-Administered Medications  Medication Dose Route Frequency Provider Last Rate Last Admin   acetaminophen (TYLENOL) tablet 650 mg  650 mg Oral Q6H PRN Rankin, Shuvon  B, NP       alum & mag hydroxide-simeth (MAALOX/MYLANTA) 200-200-20 MG/5ML suspension 30 mL  30 mL Oral Q4H PRN Rankin, Shuvon B, NP        ARIPiprazole (ABILIFY) tablet 15 mg  15 mg Oral Daily Lister Brizzi, MD   15 mg at 06/28/22 0749   feeding supplement (ENSURE ENLIVE / ENSURE PLUS) liquid 237 mL  237 mL Oral BID BM Massengill, Harrold Donath, MD   237 mL at 06/28/22 1028   hydrOXYzine (ATARAX) tablet 25 mg  25 mg Oral TID PRN Sarita Bottom, MD   25 mg at 06/27/22 2148   magnesium hydroxide (MILK OF MAGNESIA) suspension 30 mL  30 mL Oral Daily PRN Rankin, Shuvon B, NP       OLANZapine zydis (ZYPREXA) disintegrating tablet 5 mg  5 mg Oral Q6H PRN Abbott Pao, Tyshawn Ciullo, MD   5 mg at 06/27/22 2148   Or   OLANZapine (ZYPREXA) injection 5 mg  5 mg Intramuscular Q6H PRN Abbott Pao, Fusako Tanabe, MD       traZODone (DESYREL) tablet 50 mg  50 mg Oral QHS PRN Rankin, Shuvon B, NP   50 mg at 06/26/22 2109    Lab Results: No results found for this or any previous visit (from the past 48 hour(s)).  Blood Alcohol level:  Lab Results  Component Value Date   ETH <10 06/23/2022    Metabolic Disorder Labs: Lab Results  Component Value Date   HGBA1C 5.1 06/23/2022   MPG 99.67 06/23/2022   No results found for: "PROLACTIN" Lab Results  Component Value Date   CHOL 164 06/23/2022   TRIG 32 06/23/2022   HDL 63 06/23/2022   CHOLHDL 2.6 06/23/2022   VLDL 6 06/23/2022   LDLCALC 95 06/23/2022    Physical Findings: AIMS: Facial and Oral Movements Muscles of Facial Expression: None, normal Lips and Perioral Area: None, normal Jaw: None, normal Tongue: None, normal,Extremity Movements Upper (arms, wrists, hands, fingers): None, normal Lower (legs, knees, ankles, toes): None, normal, Trunk Movements Neck, shoulders, hips: None, normal, Overall Severity Severity of abnormal movements (highest score from questions above): None, normal Incapacitation due to abnormal movements: None, normal Patient's awareness of abnormal movements (rate only patient's report): No Awareness, Dental Status Current problems with teeth and/or dentures?: No Does patient usually  wear dentures?: No  CIWA:    COWS:     Musculoskeletal: Strength & Muscle Tone: within normal limits Gait & Station: normal Patient leans: N/A  Psychiatric Specialty Exam: General Appearance: Appears at stated age, dressed casually, fairly groomed   Behavior: Guarded and disorganized but cooperative in general   Psychomotor Activity:No psychomotor agitation mild to moderate retardation noted      Eye Contact: Poor Speech: Decreased amount otherwise normal     Mood: Euthymic Affect: Restricted to flat affect   Thought Process: Disorganized, thought blocking noted Descriptions of Associations: Concrete Thought Content: Hallucinations: Reports auditory and visual hallucinations.   Delusions: Reports paranoia and delusions  People are trying to harm him Suicidal Thoughts: Denies SI intention or plan Homicidal Thoughts: Denies HI, intention, plan    Alertness/Orientation: Alert but partially oriented to person and place but not to time or situation   Insight: poor Judgment: poor   Memory: Adult nurse  Concentration: Limited, easily distracted Attention Span: Poor Recall: Limited Fund of Knowledge: Limited Assets  Assets:Communication Skills; Desire for Improvement; Financial Resources/Insurance; Housing; Resilience; Social Support    Physical Exam: Physical Exam Review of Systems  Constitutional: Negative.   Respiratory: Negative.    Cardiovascular: Negative.   Gastrointestinal: Negative.   Genitourinary: Negative.   Musculoskeletal: Negative.   Neurological: Negative.    Blood pressure 110/68, pulse 74, temperature 98.1 F (36.7 C), temperature source Oral, resp. rate 16, height 6\' 3"  (1.905 m), weight 81.6 kg, SpO2 100 %. Body mass index is 22.5 kg/m.   Treatment Plan Summary:  ASSESSMENT:  Diagnoses / Active Problems: Principal Problem: Psychosis (HCC) Diagnosis: Principal Problem:   Psychosis (HCC)   PLAN: Safety and  Monitoring:             -- Voluntary admission to inpatient psychiatric unit for safety, stabilization and treatment             -- Daily contact with patient to assess and evaluate symptoms and progress in treatment             -- Patient's case to be discussed in multi-disciplinary team meeting             -- Observation Level : q15 minute checks             -- Vital signs:  q12 hours             -- Precautions: suicide, elopement, and assault   2. Medications:              Continue Abilify 15 mg daily/first dose 7/20 to address psychosis and paranoia, monitor efficacy and safety.  If effective will consider Abilify Maintena injection monthly to improve long-term compliance.  If Abilify is not effective after titration, will consider switching to scheduled Zyprexa twice daily.             Zyprexa p.o. or IM as needed for psychotic agitation, monitor need and use.             Vistaril as needed for anxiety            Increase trazodone to 150 mg at night as needed for sleep, monitor effects and safety  The risks/benefits/side-effects/alternatives to this medication were discussed in detail with the patient and time was given for questions. The patient consents to medication trial.                -- Metabolic profile and EKG monitoring obtained while on an atypical antipsychotic (BMI: Lipid Panel: HbgA1c: QTc:)                            3. Labs Reviewed: CMP within normal limits, lipid profile within normal limits, CBC within normal limits, hemoglobin A1c 5.1, TSH within normal limits UA no UTI noted, urine drug screen negative, CT head no abnormalities, EKG 7/23 QTc 370        Lab ordered: B12 and folate level pending     4. Group and Therapy: -- Encouraged patient to participate in unit milieu and in scheduled group therapies    5. Discharge Planning:              -- Social work and case management to assist with discharge planning and identification of hospital follow-up needs prior  to discharge             -- Estimated LOS: 5-7 days             -- Discharge Concerns: Need to establish a safety plan; Medication compliance and effectiveness             --  Discharge Goals: Return home with outpatient referrals for mental health follow-up including medication management/psychotherapy     Total Time Spent in Direct Patient Care:  I personally spent 35 minutes on the unit in direct patient care. The direct patient care time included face-to-face time with the patient, reviewing the patient's chart, communicating with other professionals, and coordinating care. Greater than 50% of this time was spent in counseling or coordinating care with the patient regarding goals of hospitalization, psycho-education, and discharge planning needs.   Carliss Porcaro Abbott Pao, MD 06/28/2022, 3:02 PM

## 2022-06-28 NOTE — Progress Notes (Signed)
Adult Psychoeducational Group Note  Date:  06/28/2022 Time:  9:06 PM  Group Topic/Focus:  Wrap-Up Group:   The focus of this group is to help patients review their daily goal of treatment and discuss progress on daily workbooks.  Participation Level:  Active  Participation Quality:  Appropriate  Affect:  Appropriate  Cognitive:  Appropriate  Insight: Appropriate  Engagement in Group:  Engaged  Modes of Intervention:  Discussion  Additional Comments:  Pt stated he had a descent day.  Pt stated he worked out and did push ups which are his coping skills when feeling angry, anxious, or depressed.  Wynema Birch D 06/28/2022, 9:06 PM

## 2022-06-28 NOTE — [facility] Group Notes (Signed)
  Spirituality group facilitated by Kathleen Argue, [facility].   Group Description: Group focused on topic of hope. Patients participated in facilitated discussion around topic, connecting with one another around experiences and definitions for hope. Group members engaged with visual explorer photos, reflecting on what hope looks like for them today. Group engaged in discussion around how their definitions of hope are present today in hospital.   Modalities: Psycho-social ed, Adlerian, Narrative, MI   Patient Progress: Joseph Wilson attended group towards the end and engaged in the group conversation.  His comments were on topic and appropriate.  [address] [facility] Pager, [phone]

## 2022-06-28 NOTE — Progress Notes (Signed)
Pt stated AVH getting better    06/28/22 2000  Psych Admission Type (Psych Patients Only)  Admission Status Voluntary  Psychosocial Assessment  Patient Complaints Anxiety;Suspiciousness  Eye Contact Fair  Facial Expression Anxious;Worried  Affect Preoccupied  Chartered loss adjuster Cautious  Motor Activity Slow  Appearance/Hygiene Unremarkable  Behavior Characteristics Pacing;Calm  Mood Preoccupied  Aggressive Behavior  Effect No apparent injury  Thought Process  Coherency Blocking  Content Preoccupation  Delusions Paranoid  Perception Hallucinations  Hallucination Auditory;Visual  Judgment Poor  Confusion None  Danger to Self  Current suicidal ideation? Denies  Danger to Others  Danger to Others None reported or observed  Danger to Others Abnormal  Harmful Behavior to others No threats or harm toward other people

## 2022-06-28 NOTE — Progress Notes (Signed)
Pt very paranoid on the unit this evening, pt refused his night medications even though he expressed issues sleeping.

## 2022-06-28 NOTE — Progress Notes (Signed)
Adult Psychoeducational Group Note  Date:  06/28/2022 Time:  9:55 AM  Group Topic/Focus:  Goals Group:   The focus of this group is to help patients establish daily goals to achieve during treatment and discuss how the patient can incorporate goal setting into their daily lives to aide in recovery.  Participation Level:  Active  Participation Quality:  Appropriate  Affect:  Appropriate  Cognitive:  Appropriate  Insight: Appropriate  Engagement in Group:  Engaged  Modes of Intervention:  Discussion  Additional Comments: Patient attended group  Joseph Wilson 06/28/2022, 9:55 AM

## 2022-06-28 NOTE — Plan of Care (Signed)
Patient awake early observed pacing in hallway most of the time and stated he only slept about 4 hours last night. Patient is med compliant. Denies SI and HI but still preoccupied and responding to internal stimuli. Patient denied any pain and has been cooperative and redirectable on unit thus far.    Problem: Health Behavior/Discharge Planning: Goal: Compliance with treatment plan for underlying cause of condition will improve Outcome: Progressing   Problem: Safety: Goal: Periods of time without injury will increase Outcome: Progressing   Problem: Coping: Goal: Will verbalize feelings Outcome: Progressing   Problem: Health Behavior/Discharge Planning: Goal: Compliance with prescribed medication regimen will improve Outcome: Progressing

## 2022-06-28 NOTE — Group Note (Signed)
Recreation Therapy Group Note   Group Topic:Team Building  Group Date: 06/28/2022 Start Time: 0930 End Time: 0955 Facilitators: Caroll Rancher, LRT,CTRS Location: [address]   Recreation Therapy Notes  Goal Area(s) Addresses:  Patient will effectively work with peer towards shared goal.  Patient will identify skills used to make activity successful.  Patient will identify how skills used during activity can be applied to reach post d/c goals.   Group Description:  [employer]. In teams of 5-6, patients were given 11 craft pipe cleaners. Using the materials provided, patients were instructed to compete again the opposing team(s) to build the tallest free-standing structure from floor level. The activity was timed; difficulty increased by Clinical research associate as Production designer, theatre/television/film continued.  Systematically resources were removed with additional directions for example, placing one arm behind their back, working in silence, and shape stipulations. LRT facilitated post-activity discussion reviewing team processes and necessary communication skills involved in completion. Patients were encouraged to reflect how the skills utilized, or not utilized, in this activity can be incorporated to positively impact support systems post discharge.   Affect/Mood: N/A   Participation Level: Did not attend    Clinical Observations/Individualized Feedback:     Plan: Continue to engage patient in RT group sessions 2-3x/week.   Caroll Rancher, LRT,CTRS 06/28/2022 1:14 PM

## 2022-06-29 LAB — VITAMIN B12: Vitamin B-12: 578 pg/mL (ref 180–914)

## 2022-06-29 LAB — FOLATE: Folate: 13.5 ng/mL (ref >5.9)

## 2022-06-29 MED ORDER — OLANZAPINE 5 MG PO TBDP
5.0000 mg | ORAL_TABLET | Freq: Two times a day (BID) | ORAL | Status: DC
  Administered 2022-06-29 – 2022-06-30 (×3): 5 mg via ORAL
  Filled 2022-06-29 (×5): qty 1

## 2022-06-29 NOTE — Progress Notes (Signed)
Pt reluctant/hesitant to take medication. Pt states, "Is it suppose to smell like that? I get so paranoid."  Pt was med compliant. Pt admits to seeing "demons." Pt denies auditory hallucinations. Pt denies ideations.

## 2022-06-29 NOTE — Progress Notes (Signed)
Pt continues to be paranoid and guarded on the unit , pt continues to endorse demons and a sense of doom    06/29/22 2000  Psych Admission Type (Psych Patients Only)  Admission Status Voluntary  Psychosocial Assessment  Patient Complaints Suspiciousness  Eye Contact Fair  Facial Expression Anxious;Worried  Affect Preoccupied  Chartered loss adjuster Cautious  Motor Activity Slow  Appearance/Hygiene Unremarkable  Behavior Characteristics Restless;Guarded  Mood Suspicious;Preoccupied  Aggressive Behavior  Effect No apparent injury  Thought Process  Coherency Blocking  Content Preoccupation  Delusions Paranoid  Perception Hallucinations  Hallucination Auditory;Visual  Judgment Poor  Confusion None  Danger to Self  Current suicidal ideation? Denies  Danger to Others  Danger to Others None reported or observed  Danger to Others Abnormal  Harmful Behavior to others No threats or harm toward other people

## 2022-06-29 NOTE — Progress Notes (Signed)
Pt is A&OX4, mild anxiety (pacing hallway), paranoid, denies suicidal ideations, denies homicidal ideations, admits to auditory hallucinations and visual hallucinations. Pt reports hearing and seeing "demons." Pt verbally agrees to approach staff if these become apparent and before harming self or others. Pt denies experiencing nightmares. Mood and affect are congruent. Pt appetite is ok. No complaints of distress, pain and/or discomfort at this time. Pt's memory appears to be grossly intact, and Pt hasn't displayed any injurious behaviors. Pt is medication compliant. There's no evidence of suicidal intent. Psychomotor activity was WNL. No s/s of Parkinson, Dystonia, Akathisia and/or Tardive Dyskinesia noted.

## 2022-06-29 NOTE — Progress Notes (Signed)
   06/29/22 0500  Sleep  Number of Hours 4.5

## 2022-06-29 NOTE — Progress Notes (Signed)
[facility] MD Progress Note  06/29/2022 12:23 PM Asar MEMPHIS DECOTEAU  MRN:  [id number]   Reason for Admission:  Joseph Wilson is a 21 y.o., male with no past psychiatric history who presents to the [facility] from [facility] for evaluation and management of worsening psychosis and paranoia.  According to outside records, the patient presented to the behavioral health unit for [place] via [place] police voluntarily as a walk-in with complaint paranoia, auditory and visual hallucinations.The patient is currently on Hospital Day 5.   Chart Review from last 24 hours:  The patient's chart was reviewed and nursing notes were reviewed. The patient's case was discussed in multidisciplinary team meeting. Per MAR no Zyprexa Zydis or trazodone given last night.  Per staff patient is psychotic disorganized and paranoid on the unit. Information Obtained Today During Patient Interview: The patient was seen and evaluated on the unit.  During evaluation today patient continues to demonstrate paranoia and psychosis telling me he had bed sleep "going through spiritual things demons attacking me" he reports demons attacking him while awake as well then he starts talking to himself and looking over his shoulders in a paranoid way mumbling "I will beat the demons in Jesus name" "I am praying on the demons to make them go away" he also describes feeling his testicles being pulled, describes tactile hallucinations, he denies side effect to medications and does not demonstrate any sign of EPS when examined.  Presents with poor eye contact paranoid and disorganized.  He tells me mother visited last night and visit went well he tells me that he does not discuss things about demons with his family, he tells me that he is afraid that the demons will hurt his family "if they know" He denies suicidal or homicidal ideation intention or plan Reports paranoia from staff and other peers but does not  elaborate any further. Today he reports visual hallucinations seeing demons in the form of mist Denies related hallucinations at this time. Sleep  Sleep: Currently disrupted  Principal Problem: Psychosis (HCC) Diagnosis: Principal Problem:   Psychosis (HCC)    Past Psychiatric History: Prior Psychiatric diagnoses: None Past Psychiatric Hospitalizations: None   History of self mutilation: None Past suicide attempts: None Past history of HI, violent or aggressive behavior: None   Past Psychiatric medications trials: None History of ECT/TMS: None   Outpatient psychiatric Follow up: None Prior Outpatient Therapy: None  Past Medical History:  Past Medical History:  Diagnosis Date   Acid reflux    History reviewed. No pertinent surgical history. Family History: History reviewed. No pertinent family history. Family Psychiatric  History: Psychiatric illness: None reported Suicide: None reported Substance Abuse: None reported Social History: Living situation: Lives in [place] with his parents, 39 years old sister and 2 brothers ages 30 and 62 years old Social support: Family is supportive Marital Status: Never married Children: No children Education: Per father patient finished high school Employment: Immunologist: Denies Legal history: No pending charges or court dates Trauma: None reported Access to guns: The patient's father confirms patient has no access to guns at home  Current Medications: Current Facility-Administered Medications  Medication Dose Route Frequency Provider Last Rate Last Admin   acetaminophen (TYLENOL) tablet 650 mg  650 mg Oral Q6H PRN Rankin, Shuvon B, NP       alum & mag hydroxide-simeth (MAALOX/MYLANTA) 200-200-20 MG/5ML suspension 30 mL  30 mL Oral Q4H PRN Rankin, Shuvon B, NP  ARIPiprazole (ABILIFY) tablet 15 mg  15 mg Oral Daily Abbott Pao, Greg Eckrich, MD   15 mg at 06/29/22 1610   feeding supplement (ENSURE ENLIVE / ENSURE PLUS)  liquid 237 mL  237 mL Oral BID BM Massengill, Harrold Donath, MD   237 mL at 06/29/22 0911   hydrOXYzine (ATARAX) tablet 25 mg  25 mg Oral TID PRN Sarita Bottom, MD   25 mg at 06/27/22 2148   magnesium hydroxide (MILK OF MAGNESIA) suspension 30 mL  30 mL Oral Daily PRN Rankin, Shuvon B, NP       OLANZapine zydis (ZYPREXA) disintegrating tablet 5 mg  5 mg Oral Q6H PRN Abbott Pao, Cyera Balboni, MD       Or   OLANZapine (ZYPREXA) injection 5 mg  5 mg Intramuscular Q6H PRN Abbott Pao, Mieke Brinley, MD       traZODone (DESYREL) tablet 150 mg  150 mg Oral QHS PRN Abbott Pao, Quentin Strebel, MD        Lab Results:  Results for orders placed or performed during the hospital encounter of 06/24/22 (from the past 48 hour(s))  Vitamin B12     Status: None   Collection Time: 06/29/22  6:45 AM  Result Value Ref Range   Vitamin B-12 578 180 - 914 pg/mL    Comment: (NOTE) This assay is not validated for testing neonatal or myeloproliferative syndrome specimens for Vitamin B12 levels. Performed at [facility], [address] [place], [place] [address]   Folate     Status: None   Collection Time: 06/29/22  6:45 AM  Result Value Ref Range   Folate 13.5 >5.9 ng/mL    Comment: Performed at [facility], [address] [place], [place] [address]    Blood Alcohol level:  Lab Results  Component Value Date   ETH <10 06/23/2022    Metabolic Disorder Labs: Lab Results  Component Value Date   HGBA1C 5.1 06/23/2022   MPG 99.67 06/23/2022   No results found for: "PROLACTIN" Lab Results  Component Value Date   CHOL 164 06/23/2022   TRIG 32 06/23/2022   HDL 63 06/23/2022   CHOLHDL 2.6 06/23/2022   VLDL 6 06/23/2022   LDLCALC 95 06/23/2022    Physical Findings: AIMS: Facial and Oral Movements Muscles of Facial Expression: None, normal Lips and Perioral Area: None, normal Jaw: None, normal Tongue: None, normal,Extremity Movements Upper (arms, wrists, hands, fingers): None, normal Lower (legs,  knees, ankles, toes): None, normal, Trunk Movements Neck, shoulders, hips: None, normal, Overall Severity Severity of abnormal movements (highest score from questions above): None, normal Incapacitation due to abnormal movements: None, normal Patient's awareness of abnormal movements (rate only patient's report): No Awareness, Dental Status Current problems with teeth and/or dentures?: No Does patient usually wear dentures?: No  CIWA:    COWS:     Musculoskeletal: Strength & Muscle Tone: within normal limits Gait & Station: normal Patient leans: N/A  Psychiatric Specialty Exam: General Appearance: Appears at stated age, dressed casually, fairly groomed   Behavior: Guarded and disorganized but cooperative in general   Psychomotor Activity:No psychomotor agitation mild to moderate retardation noted      Eye Contact: Poor Speech: Decreased amount otherwise normal     Mood: Euthymic Affect: Restricted to flat affect   Thought Process: Disorganized, thought blocking noted Descriptions of Associations: Concrete Thought Content: Hallucinations: Reports visual hallucinations, denies auditory hallucinations Delusions: Reports paranoia and delusions  People are trying to harm him Suicidal Thoughts: Denies SI intention or plan Homicidal Thoughts:  Denies HI, intention, plan    Alertness/Orientation: Alert but partially oriented to person and place but not to time or situation   Insight: poor Judgment: poor   Memory: Limited    Executive Functions  Concentration: Limited, easily distracted Attention Span: Poor Recall: Limited Fund of Knowledge: Limited Assets  Assets:Communication Skills; Desire for Improvement; Financial Resources/Insurance; Housing; Resilience; Social Support    Physical Exam: Physical Exam Review of Systems  Constitutional: Negative.   Respiratory: Negative.    Cardiovascular: Negative.   Gastrointestinal: Negative.   Genitourinary: Negative.    Musculoskeletal: Negative.   Neurological: Negative.    Blood pressure 119/78, pulse 90, temperature 98.1 F (36.7 C), temperature source Oral, resp. rate 16, height 6\' 3"  (1.905 m), weight 81.6 kg, SpO2 100 %. Body mass index is 22.5 kg/m.   Treatment Plan Summary:  ASSESSMENT:  Diagnoses / Active Problems: Principal Problem: Psychosis (HCC) Diagnosis: Principal Problem:   Psychosis (HCC)   PLAN: Safety and Monitoring:             -- Voluntary admission to inpatient psychiatric unit for safety, stabilization and treatment             -- Daily contact with patient to assess and evaluate symptoms and progress in treatment             -- Patient's case to be discussed in multi-disciplinary team meeting             -- Observation Level : q15 minute checks             -- Vital signs:  q12 hours             -- Precautions: suicide, elopement, and assault   2. Medications:              Discontinue Abilify for lack of efficacy  Start Zyprexa Zydis 5 mg twice daily for psychosis and monitor efficacy and safety and titrate dosing accordingly             Zyprexa p.o. or IM as needed for psychotic agitation, monitor need and use.             Vistaril as needed for anxiety            Continue 150 mg at night as needed for sleep, monitor effects and safety  The risks/benefits/side-effects/alternatives to this medication were discussed in detail with the patient and time was given for questions. The patient consents to medication trial.                -- Metabolic profile and EKG monitoring obtained while on an atypical antipsychotic (BMI: Lipid Panel: HbgA1c: QTc:)                            3. Labs Reviewed: CMP within normal limits, lipid profile within normal limits, CBC within normal limits, hemoglobin A1c 5.1, TSH within normal limits UA no UTI noted, urine drug screen negative, CT head no abnormalities, EKG 7/23 QTc 370 B12 and folate within normal limits      Lab ordered: None   4. Group and Therapy: -- Encouraged patient to participate in unit milieu and in scheduled group therapies    5. Discharge Planning:              -- Social work and case management to assist with discharge planning and identification of hospital follow-up needs prior to discharge             --  Estimated LOS: 5-7 days             -- Discharge Concerns: Need to establish a safety plan; Medication compliance and effectiveness             -- Discharge Goals: Return home with outpatient referrals for mental health follow-up including medication management/psychotherapy     Total Time Spent in Direct Patient Care:  I personally spent 35 minutes on the unit in direct patient care. The direct patient care time included face-to-face time with the patient, reviewing the patient's chart, communicating with other professionals, and coordinating care. Greater than 50% of this time was spent in counseling or coordinating care with the patient regarding goals of hospitalization, psycho-education, and discharge planning needs.   Kelli Egolf Abbott Pao, MD 06/29/2022, 12:23 PM

## 2022-06-29 NOTE — Progress Notes (Deleted)
The focus of this group is to help patients establish daily goals to achieve during treatment and discuss how the patient can incorporate goal setting into their daily lives to aide in recovery. 

## 2022-06-29 NOTE — Group Note (Signed)
LCSW Group Therapy Note  06/29/2022     Type of Therapy and Topic:  Group Therapy: Anger and Coping Skills  Participation Level:  Active   Description of Group:   In this group, patients learned how to recognize the physical, cognitive, emotional, and behavioral responses they have to anger-provoking situations.  They identified how they usually or often react when angered, and learned how healthy and unhealthy coping skills work initially, but the unhealthy ones stop working.   They analyzed how their frequently-chosen coping skill is possibly beneficial and how it is possibly unhelpful.  The group discussed a variety of healthier coping skills that could help in resolving the actual issues, as well as how to go about planning for the the possibility of future similar situations.  Therapeutic Goals: Patients will identify one thing that makes them angry and how they feel emotionally and physically, what their thoughts are or tend to be in those situations, and what healthy or unhealthy coping mechanism they typically use Patients will identify how their coping technique works for them, as well as how it works against them. Patients will explore possible new behaviors to use in future anger situations. Patients will learn that anger itself is normal and cannot be eliminated, and that healthier coping skills can assist with resolving conflict rather than worsening situations.  Summary of Patient Progress:  The patient shared that he often is angered by people who do things out of spite and who get irrational and chooses to cope with these feelings by looking for peace by deescalating the situation or walking away.  The patient sees this coping skill as healthy.  He was very attentive throughout group as others shared as well.  Therapeutic Modalities:   Cognitive Behavioral Therapy   Lynnell Chad  .

## 2022-06-29 NOTE — [facility] Group Notes (Signed)
Pt attended group and rated day 8/10.

## 2022-06-30 MED ORDER — OLANZAPINE 10 MG PO TBDP
10.0000 mg | ORAL_TABLET | Freq: Two times a day (BID) | ORAL | Status: DC
Start: 2022-06-30 — End: 2022-07-03
  Administered 2022-06-30 – 2022-07-03 (×6): 10 mg via ORAL
  Filled 2022-06-30 (×8): qty 1

## 2022-06-30 MED ORDER — LORATADINE 10 MG PO TABS
10.0000 mg | ORAL_TABLET | Freq: Every day | ORAL | Status: DC
Start: 2022-06-30 — End: 2022-07-18
  Administered 2022-06-30 – 2022-07-18 (×19): 10 mg via ORAL
  Filled 2022-06-30 (×20): qty 1

## 2022-06-30 NOTE — Progress Notes (Signed)
[facility] Group Notes:  (Nursing/MHT/Case Management/Adjunct)  Date:  06/30/2022  Time:  2000  Type of Therapy:   wrap up group  Participation Level:  Active  Participation Quality:  Appropriate, Attentive, Sharing, and Supportive  Affect:  Anxious  Cognitive:  Alert  Insight:  Improving  Engagement in Group:  Engaged  Modes of Intervention:  Clarification, Education, and Support  Summary of Progress/Problems: Positive thinking and positive change were discussed.   Marcille Buffy 06/30/2022, 8:38 PM

## 2022-06-30 NOTE — Group Note (Signed)
[facility] LCSW Group Therapy Note  Date/Time:  06/30/2022  11:00AM-12:00PM  Type of Therapy and Topic:  Group Therapy:  Music and Mood  Participation Level:  Active   Description of Group: In this process group, members listened to a variety of genres of music and identified that different types of music evoke different responses.  Patients were encouraged to identify music that was soothing for them and music that was energizing for them.  Patients discussed how this knowledge can help with wellness and recovery in various ways including managing depression and anxiety as well as encouraging healthy sleep habits.    Therapeutic Goals: Patients will explore the impact of different varieties of music on mood Patients will verbalize the thoughts they have when listening to different types of music Patients will identify music that is soothing to them as well as music that is energizing to them Patients will discuss how to use this knowledge to assist in maintaining wellness and recovery Patients will explore the use of music as a coping skill  Summary of Patient Progress:  At the beginning of group, patient expressed his mood was peaceful.  At the end of group, patient expressed his mood was still peaceful.  He smiled a great deal and expressed enjoyment of different songs.    Therapeutic Modalities: Solution Focused Brief Therapy Activity   Ambrose Mantle, LCSW

## 2022-06-30 NOTE — Progress Notes (Signed)
Pt presents with suspicious paranoid mood, affect congruent. Joseph Wilson continues to be very suspicious, guarded and quiet on the unit. This morning he waited until all patients /peers away from hallway to approach med room. Compliant with am medications. He denied any AH or VH with Clinical research associate, however staff report patient obviously responding to internal stimuli yesterday. He did complete his self inventory and attend am programming. He wrote his goal is to '' have better things to say in every conversation'' and to '' avoid saying the less respected comment.  God bless the staff and I was wondering if I'm doing the discharge plan properly. Patient rates his sleep as good, his appetite good, and his depression at 0/10 on scale, 10 being worst, 0 being none. Patient also rates his anxiety and hopelessness at 1/10 on same scale. Pt is safe, will con't to monitor.

## 2022-06-30 NOTE — Progress Notes (Signed)
   06/30/22 0515  Sleep  Number of Hours 7

## 2022-06-30 NOTE — Progress Notes (Addendum)
   Pt is alert and oriented and appears much brighter during this assessment.  Pt presents with moderate anxiety and no depression. Pt reports just completing visit with his father. Pt "smiles" as he states, "everyone says I look just like my dad." Writer shares that it is good having family support. Pt observed in dayroom for group and enjoying television time with peers. Pt denies SI/HI and verbally contracts for safety.  Pt endorses AVH stating he sees "demons."  Administered PRN Trazodone per [facility] per pt request.  Provided reassurance through frequent verbal contacts.  Pt is safe on the unit with Q 15 safety checks.    06/30/22 2037  Psych Admission Type (Psych Patients Only)  Admission Status Voluntary  Psychosocial Assessment  Patient Complaints Nervousness;Suspiciousness;Tension  Eye Contact Intense  Facial Expression Anxious;Wide-eyed  Affect Preoccupied  Speech Soft  Interaction Forwards little;Cautious;Guarded  Motor Activity Slow  Appearance/Hygiene Unremarkable  Behavior Characteristics Cooperative;Anxious;Guarded  Mood Pleasant;Preoccupied;Suspicious  Thought Process  Coherency Blocking  Content Preoccupation  Delusions Paranoid  Perception Hallucinations  Hallucination Auditory;Visual  Judgment Poor  Confusion None  Danger to Self  Current suicidal ideation? Denies  Danger to Others  Danger to Others None reported or observed  Danger to Others Abnormal  Harmful Behavior to others No threats or harm toward other people  Destructive Behavior No threats or harm toward property

## 2022-06-30 NOTE — Progress Notes (Signed)
Pt mood /paranoia improving, after dinner smiling and joking with peers, relating to their rap music.

## 2022-06-30 NOTE — [facility] Group Notes (Signed)
Pt. Attended  gold group. Pt. Stated that his gold for to day is to be better with communication.

## 2022-06-30 NOTE — Progress Notes (Addendum)
[facility] MD Progress Note  06/30/2022 11:08 AM Joseph Wilson  MRN:  [id number]   Reason for Admission:  Joseph Wilson is a 21 y.o., male with no past psychiatric history who presents to the [facility] from [facility] for evaluation and management of worsening psychosis and paranoia.  According to outside records, the patient presented to the behavioral health unit for [place] via [place] police voluntarily as a walk-in with complaint paranoia, auditory and visual hallucinations.The patient is currently on Hospital Day 6.   Chart Review from last 24 hours:  The patient's chart was reviewed and nursing notes were reviewed. The patient's case was discussed in multidisciplinary team meeting. Per MAR no Zyprexa Zydis or trazodone given last night.  Per staff patient was psychotic and disorganized, paranoid almost all day yesterday, received trazodone last night for sleep. Information Obtained Today During Patient Interview: The patient was seen and evaluated on the unit.  During evaluation today patient reports good sleep last night describes okay mood today denies feeling depressed or anxious denies SI HI or AVH when asked specifically if he feels paranoid in the hospital he responds "not anymore" "everything got revealed it is fine now" later when asking him if he is still seeing demons he notes "less frequent" when I asked him if demons are still trying to harm him he responds "that is getting better" when I asked him if he still feeling like getting castrated he responds "it is less scary and less intense but I still have my concerns" he continues to present with limited eye contact and some thought blocking but seems to be improving becoming less preoccupied with his delusions as noted above.  He tells me he did not need all his breakfast this morning because "I worry about other people" it is unclear if he is paranoid that somebody is poisoning his food yet he  denies when asked directly.  Per staff he eats better when food is brought to him in the room, will follow.  Sleep  Sleep: Improved, good  Principal Problem: Psychosis (HCC) Diagnosis: Principal Problem:   Psychosis (HCC)    Past Psychiatric History: Prior Psychiatric diagnoses: None Past Psychiatric Hospitalizations: None   History of self mutilation: None Past suicide attempts: None Past history of HI, violent or aggressive behavior: None   Past Psychiatric medications trials: None History of ECT/TMS: None   Outpatient psychiatric Follow up: None Prior Outpatient Therapy: None  Past Medical History:  Past Medical History:  Diagnosis Date   Acid reflux    History reviewed. No pertinent surgical history. Family History: History reviewed. No pertinent family history. Family Psychiatric  History: Psychiatric illness: None reported Suicide: None reported Substance Abuse: None reported Social History: Living situation: Lives in [place] with his parents, 54 years old sister and 2 brothers ages 110 and 66 years old Social support: Family is supportive Marital Status: Never married Children: No children Education: Per father patient finished high school Employment: Immunologist: Denies Legal history: No pending charges or court dates Trauma: None reported Access to guns: The patient's father confirms patient has no access to guns at home  Current Medications: Current Facility-Administered Medications  Medication Dose Route Frequency Provider Last Rate Last Admin   acetaminophen (TYLENOL) tablet 650 mg  650 mg Oral Q6H PRN Rankin, Shuvon B, NP       alum & mag hydroxide-simeth (MAALOX/MYLANTA) 200-200-20 MG/5ML suspension 30 mL  30 mL Oral Q4H PRN Rankin, Shuvon B,  NP       feeding supplement (ENSURE ENLIVE / ENSURE PLUS) liquid 237 mL  237 mL Oral BID BM Massengill, Harrold Donath, MD   237 mL at 06/29/22 0911   hydrOXYzine (ATARAX) tablet 25 mg  25 mg Oral TID  PRN Sarita Bottom, MD   25 mg at 06/27/22 2148   magnesium hydroxide (MILK OF MAGNESIA) suspension 30 mL  30 mL Oral Daily PRN Rankin, Shuvon B, NP       OLANZapine zydis (ZYPREXA) disintegrating tablet 5 mg  5 mg Oral Q6H PRN Abbott Pao, Nary Sneed, MD       Or   OLANZapine (ZYPREXA) injection 5 mg  5 mg Intramuscular Q6H PRN Abbott Pao, Julionna Marczak, MD       OLANZapine zydis (ZYPREXA) disintegrating tablet 5 mg  5 mg Oral BID Abbott Pao, Math Brazie, MD   5 mg at 06/30/22 0732   traZODone (DESYREL) tablet 150 mg  150 mg Oral QHS PRN Sarita Bottom, MD   150 mg at 06/29/22 1953    Lab Results:  Results for orders placed or performed during the hospital encounter of 06/24/22 (from the past 48 hour(s))  Vitamin B12     Status: None   Collection Time: 06/29/22  6:45 AM  Result Value Ref Range   Vitamin B-12 578 180 - 914 pg/mL    Comment: (NOTE) This assay is not validated for testing neonatal or myeloproliferative syndrome specimens for Vitamin B12 levels. Performed at [facility], [address], [place], [place] [address]   Folate     Status: None   Collection Time: 06/29/22  6:45 AM  Result Value Ref Range   Folate 13.5 >5.9 ng/mL    Comment: Performed at [facility], [address], [place], [place] [address]    Blood Alcohol level:  Lab Results  Component Value Date   ETH <10 06/23/2022    Metabolic Disorder Labs: Lab Results  Component Value Date   HGBA1C 5.1 06/23/2022   MPG 99.67 06/23/2022   No results found for: "PROLACTIN" Lab Results  Component Value Date   CHOL 164 06/23/2022   TRIG 32 06/23/2022   HDL 63 06/23/2022   CHOLHDL 2.6 06/23/2022   VLDL 6 06/23/2022   LDLCALC 95 06/23/2022    Physical Findings: AIMS: Facial and Oral Movements Muscles of Facial Expression: None, normal Lips and Perioral Area: None, normal Jaw: None, normal Tongue: None, normal,Extremity Movements Upper (arms, wrists, hands, fingers): None, normal Lower (legs,  knees, ankles, toes): None, normal, Trunk Movements Neck, shoulders, hips: None, normal, Overall Severity Severity of abnormal movements (highest score from questions above): None, normal Incapacitation due to abnormal movements: None, normal Patient's awareness of abnormal movements (rate only patient's report): No Awareness, Dental Status Current problems with teeth and/or dentures?: No Does patient usually wear dentures?: No  CIWA:    COWS:     Musculoskeletal: Strength & Muscle Tone: within normal limits Gait & Station: normal Patient leans: N/A  Psychiatric Specialty Exam: General Appearance: Appears at stated age, dressed casually, fairly groomed   Behavior: Guarded and disorganized but cooperative in general   Psychomotor Activity:No psychomotor agitation mild retardation noted      Eye Contact: Poor Speech: Decreased amount otherwise normal     Mood: Euthymic Affect: Restricted to flat affect   Thought Process: Disorganized, thought blocking noted but less Descriptions of Associations: Concrete Thought Content: Hallucinations: Reports visual hallucinations but less frequent and intense, denies auditory hallucinations Delusions: Reports paranoia and  delusions but seems to be less preoccupied with his delusions Suicidal Thoughts: Denies SI intention or plan Homicidal Thoughts: Denies HI, intention, plan    Alertness/Orientation: Alert but partially oriented to person and place but not to time or situation   Insight: poor Judgment: poor   Memory: Limited    Executive Functions  Concentration: Limited, easily distracted Attention Span: Poor Recall: Limited Fund of Knowledge: Limited Assets  Assets:Communication Skills; Desire for Improvement; Financial Resources/Insurance; Housing; Resilience; Social Support    Physical Exam: Physical Exam Review of Systems  Constitutional: Negative.   HENT:  Positive for congestion.   Eyes: Negative.   Respiratory:  Negative.    Cardiovascular: Negative.   Gastrointestinal: Negative.   Genitourinary: Negative.   Musculoskeletal: Negative.   Neurological: Negative.    Blood pressure 117/79, pulse 83, temperature 97.8 F (36.6 C), resp. rate 14, height 6\' 3"  (1.905 m), weight 81.6 kg, SpO2 100 %. Body mass index is 22.5 kg/m.   Treatment Plan Summary:  ASSESSMENT:  Diagnoses / Active Problems: Principal Problem: Psychosis (HCC) Diagnosis: Principal Problem:   Psychosis (HCC)   PLAN: Safety and Monitoring:             -- Voluntary admission to inpatient psychiatric unit for safety, stabilization and treatment             -- Daily contact with patient to assess and evaluate symptoms and progress in treatment             -- Patient's case to be discussed in multi-disciplinary team meeting             -- Observation Level : q15 minute checks             -- Vital signs:  q12 hours             -- Precautions: suicide, elopement, and assault   2. Medications:               Continue Zyprexa Zydis and titrate from 5-10 mg twice daily starting tonight 7/30 for psychosis and monitor efficacy and safety and titrate dosing accordingly             Zyprexa p.o. or IM as needed for psychotic agitation, monitor need and use.             Vistaril as needed for anxiety            Continue trazodone 150 mg at night as needed for sleep, monitor effects and safety  Starting Claritin 10 mg for possible seasonal allergy  The risks/benefits/side-effects/alternatives to this medication were discussed in detail with the patient and time was given for questions. The patient consents to medication trial.                -- Metabolic profile and EKG monitoring obtained while on an atypical antipsychotic (BMI: Lipid Panel: HbgA1c: QTc:)                            3. Labs Reviewed: CMP within normal limits, lipid profile within normal limits, CBC within normal limits, hemoglobin A1c 5.1, TSH within normal limits UA no  UTI noted, urine drug screen negative, CT head no abnormalities, EKG 7/23 QTc 370 B12 and folate within normal limits      Lab ordered: None  4. Group and Therapy: -- Encouraged patient to participate in unit milieu and in scheduled group therapies    5.  Discharge Planning:              -- Social work and case management to assist with discharge planning and identification of hospital follow-up needs prior to discharge             -- Estimated LOS: 5-7 days             -- Discharge Concerns: Need to establish a safety plan; Medication compliance and effectiveness             -- Discharge Goals: Return home with outpatient referrals for mental health follow-up including medication management/psychotherapy     Total Time Spent in Direct Patient Care:  I personally spent 35 minutes on the unit in direct patient care. The direct patient care time included face-to-face time with the patient, reviewing the patient's chart, communicating with other professionals, and coordinating care. Greater than 50% of this time was spent in counseling or coordinating care with the patient regarding goals of hospitalization, psycho-education, and discharge planning needs.   Suzan Manon Abbott Pao, MD 06/30/2022, 11:08 AM

## 2022-06-30 NOTE — [facility] Group Notes (Signed)
PsychoEducational Group: Patients were asked to participate in group educating regarding positive reframing and the impact of negative thoughts. A poem was read in which patients were encouraged to identify negative thoughts or beliefs about themselves that were untrue, and negatively impacted there mental wellbeing. Patient attended and particpated. Pt shared his negative belief was that he had bad posture.

## 2022-06-30 NOTE — Plan of Care (Signed)
  Problem: Education: Goal: Emotional status will improve Outcome: Progressing Goal: Verbalization of understanding the information provided will improve Outcome: Progressing   Problem: Activity: Goal: Interest or engagement in activities will improve Outcome: Progressing   Problem: Coping: Goal: Ability to demonstrate self-control will improve Outcome: Progressing   Problem: Health Behavior/Discharge Planning: Goal: Compliance with treatment plan for underlying cause of condition will improve Outcome: Progressing

## 2022-07-01 ENCOUNTER — Encounter (HOSPITAL_COMMUNITY): Payer: Self-pay

## 2022-07-01 NOTE — Progress Notes (Signed)
     07/01/22 2045  Psych Admission Type (Psych Patients Only)  Admission Status Voluntary  Psychosocial Assessment  Patient Complaints Anxiety;Nervousness;Suspiciousness  Eye Contact Intense  Facial Expression Anxious;Wide-eyed  Affect Preoccupied  Speech Soft  Interaction Cautious;Forwards little;Guarded  Motor Activity Slow  Appearance/Hygiene Unremarkable  Behavior Characteristics Cooperative;Anxious;Guarded  Mood Pleasant;Preoccupied;Suspicious  Thought Process  Coherency Blocking  Content Preoccupation  Delusions Paranoid  Perception Hallucinations  Hallucination Auditory;Visual  Judgment Poor  Confusion None  Danger to Self  Current suicidal ideation? Denies  Danger to Others  Danger to Others None reported or observed  Danger to Others Abnormal  Harmful Behavior to others No threats or harm toward other people  Destructive Behavior No threats or harm toward property

## 2022-07-01 NOTE — [facility] Group Notes (Signed)
[facility] Group Notes:  (Nursing/MHT/Case Management/Adjunct)  Date:  07/01/2022  Time:  9:47 AM  Type of Therapy:   Orientation/Goals group  Participation Level:  Active  Participation Quality:  Appropriate  Affect:  Appropriate  Cognitive:  Appropriate  Insight:  Appropriate  Engagement in Group:  Engaged and Improving  Modes of Intervention:  Discussion, Education, Orientation, and Support  Summary of Progress/Problems: Pt goal for today is to do fifty push-ups and drink plenty of water.   Duong Haydel J Chaye Misch 07/01/2022, 9:47 AM

## 2022-07-01 NOTE — Group Note (Signed)
Recreation Therapy Group Note   Group Topic:Health and Wellness  Group Date: 07/01/2022 Start Time: 1000 End Time: 1030 Facilitators: Caroll Rancher, LRT,CTRS Location: [address] Dayroom   Goal Area(s) Addresses:  Patient will define components of whole wellness. Patient will verbalize benefit of whole wellness.  Group Description:  Exercise.  LRT and patients discussed the importance of having mental, physical and spiritual stability.  LRT and patients discussed how each area was important to making the individual feel whole.  LRT told the patients when those areas feel off, to take time for themselves to regroup by doing things such as going for a walk, watching a movie, listening to music, etc.  LRT and patients then proceeded to engage in exercise.  LRT led patients in a series of stretches to get loose.  Patients then took turns leading group in an exercise of their choosing.  The activity was to take at least 30 minutes.  Patients were encouraged to take breaks or get water as needed.   Affect/Mood: Appropriate   Participation Level: Engaged   Participation Quality: Independent   Behavior: Appropriate   Speech/Thought Process: Focused   Insight: Good   Judgement: Good   Modes of Intervention: Music and Exercise   Patient Response to Interventions:  Engaged   Education Outcome:  Acknowledges education and In group clarification offered    Clinical Observations/Individualized Feedback: Pt was quiet but engaged in group.  Pt was social with peers and into the activity.  Pt led group in jumping jacks, sit ups and a few other exercises.  Pt was bright and attentive throughout group session.    Plan: Continue to engage patient in RT group sessions 2-3x/week.   Caroll Rancher, Antonietta Jewel 07/01/2022 12:41 PM

## 2022-07-01 NOTE — Progress Notes (Signed)
   07/01/22 1000  Psych Admission Type (Psych Patients Only)  Admission Status Voluntary  Psychosocial Assessment  Patient Complaints Nervousness;Suspiciousness  Eye Contact Intense  Facial Expression Anxious;Wide-eyed  Affect Preoccupied  Speech Soft  Interaction Cautious;Forwards little;Guarded  Motor Activity Slow  Appearance/Hygiene Unremarkable  Behavior Characteristics Cooperative;Anxious;Guarded  Mood Pleasant;Preoccupied;Suspicious  Aggressive Behavior  Effect No apparent injury  Thought Process  Coherency Blocking  Content Preoccupation  Delusions Paranoid  Perception Hallucinations  Hallucination Auditory;Visual  Judgment Poor  Confusion None  Danger to Self  Current suicidal ideation? Denies  Danger to Others  Danger to Others None reported or observed

## 2022-07-01 NOTE — Group Note (Signed)
LCSW Group Therapy Note   Group Date: 07/01/2022 Start Time: 1300 End Time: 1400   Type of Therapy and Topic:  Group Therapy: Challenging Core Beliefs  Participation Level:  Active  Description of Group:  Patients were educated about core beliefs and asked to identify one harmful core belief that they have. Patients were asked to explore from where those beliefs originate. Patients were asked to discuss how those beliefs make them feel and the resulting behaviors of those beliefs. They were then be asked if those beliefs are true and, if so, what evidence they have to support them. Lastly, group members were challenged to replace those negative core beliefs with helpful beliefs.   Therapeutic Goals:   1. Patient will identify harmful core beliefs and explore the origins of such beliefs. 2. Patient will identify feelings and behaviors that result from those core beliefs. 3. Patient will discuss whether such beliefs are true. 4.  Patient will replace harmful core beliefs with helpful ones.  Summary of Patient Progress:  Joseph Wilson actively engaged in processing and exploring how core beliefs are formed and how they impact thoughts, feelings, and behaviors. Patient proved open to input from peers and feedback from CSW. Patient demonstrated fair insight into the subject matter, was respectful and supportive of peers, and participated throughout the entire session.  Therapeutic Modalities: Cognitive Behavioral Therapy; Solution-Focused Therapy   Beatris Si, LCSW 07/01/2022  2:13 PM

## 2022-07-01 NOTE — [facility] Group Notes (Signed)
Adult Psychoeducational Group Note  Date:  07/01/2022 Time:  8:27 PM  Group Topic/Focus:  Wrap-Up Group:   The focus of this group is to help patients review their daily goal of treatment and discuss progress on daily workbooks.  Participation Level:  Active  Participation Quality:  Attentive  Affect:  Appropriate  Cognitive:  Appropriate  Insight: Appropriate  Engagement in Group:  Engaged  Modes of Intervention:  Discussion  Additional Comments:  Patient attended and participated in the Wrap-up.  Jearl Klinefelter 07/01/2022, 8:27 PM

## 2022-07-01 NOTE — [facility] Group Notes (Signed)
Adult Psychoeducational Group Note  Date:  07/01/2022 Time:  2:41 PM  Group Topic/Focus:  Wellness Toolbox:   The focus of this group is to discuss various aspects of wellness, balancing those aspects and exploring ways to increase the ability to experience wellness.  Patients will create a wellness toolbox for use upon discharge.  Participation Level:  Active  Participation Quality:  Appropriate  Affect:  Appropriate  Cognitive:  Appropriate  Insight: Appropriate  Engagement in Group:  Engaged  Modes of Intervention:  Activity  Additional Comments:  Patient attended and participated in the relaxation group activity.  Jearl Klinefelter 07/01/2022, 2:41 PM

## 2022-07-01 NOTE — Progress Notes (Signed)
[facility] MD Progress Note  07/01/2022 11:33 AM Joseph Wilson  MRN:  [id number]   Reason for Admission:  Joseph Wilson is a 21 y.o., male with no past psychiatric history who presents to the [facility] from [facility] for evaluation and management of worsening psychosis and paranoia.  According to outside records, the patient presented to the behavioral health unit for [place] via [place] police voluntarily as a walk-in with complaint paranoia, auditory and visual hallucinations.The patient is currently on Hospital Day 7.   Chart Review from last 24 hours:  The patient's chart was reviewed and nursing notes were reviewed. The patient's case was discussed in multidisciplinary team meeting. Per [facility] patient is compliant with Zyprexa Zydis twice daily scheduled no Zyprexa as needed needed or used, using trazodone 150 mg as needed for the past 2 nights for sleep, some use of Atarax as needed for anxiety with last use 7/27.  Patient reported by staff to be less paranoid and more interactive.  Information Obtained Today During Patient Interview: The patient was seen and evaluated on the unit.  During evaluation today patient presents with more lucid intervals less paranoid and less delusional much less preoccupied with his paranoia seems to be much less preoccupied with visual hallucinations seeing demons as well, when asking specifically if he is paranoid that people are trying to harm him and he responds "it is less now" when asked him specifically if he seeing demons he responds "I am not sure maybe creations but not demons it is not that bad" when asked him specifically if he still feels getting castrated he responds "a little bit" he describes feeling much less fearful about it but continues to report "the group I was thinking they are doing it I thought they are underground I noticed they are following me but it is less intense"  Patient denies side effect to  medications and does not present with any sedation or symptoms or signs consistent with EPS.  Spoke to patient's father who visited the patient over the weekend and confirms patient seems to be less paranoid less fearful when talking about seeing demons, father describes patient to be religious at baseline.  Discussed with patient's father current improvement and need to comply with medication and follow-up appointment after discharge, father agrees to ensure compliance.  Sleep  Sleep: Improved, good  Principal Problem: Psychosis (HCC) Diagnosis: Principal Problem:   Psychosis (HCC)    Past Psychiatric History: Prior Psychiatric diagnoses: None Past Psychiatric Hospitalizations: None   History of self mutilation: None Past suicide attempts: None Past history of HI, violent or aggressive behavior: None   Past Psychiatric medications trials: None History of ECT/TMS: None   Outpatient psychiatric Follow up: None Prior Outpatient Therapy: None  Past Medical History:  Past Medical History:  Diagnosis Date   Acid reflux    History reviewed. No pertinent surgical history. Family History: History reviewed. No pertinent family history. Family Psychiatric  History: Psychiatric illness: None reported Suicide: None reported Substance Abuse: None reported Social History: Living situation: Lives in [place] with his parents, 7 years old sister and 2 brothers ages 35 and 97 years old Social support: Family is supportive Marital Status: Never married Children: No children Education: Per father patient finished high school Employment: Immunologist: Denies Legal history: No pending charges or court dates Trauma: None reported Access to guns: The patient's father confirms patient has no access to guns at home  Current Medications: Current Facility-Administered  Medications  Medication Dose Route Frequency Provider Last Rate Last Admin   acetaminophen (TYLENOL) tablet  650 mg  650 mg Oral Q6H PRN Rankin, Shuvon B, NP   650 mg at 07/01/22 0735   alum & mag hydroxide-simeth (MAALOX/MYLANTA) 200-200-20 MG/5ML suspension 30 mL  30 mL Oral Q4H PRN Rankin, Shuvon B, NP       feeding supplement (ENSURE ENLIVE / ENSURE PLUS) liquid 237 mL  237 mL Oral BID BM Massengill, Harrold Donath, MD   237 mL at 06/30/22 1450   hydrOXYzine (ATARAX) tablet 25 mg  25 mg Oral TID PRN Sarita Bottom, MD   25 mg at 06/27/22 2148   loratadine (CLARITIN) tablet 10 mg  10 mg Oral Daily Audra Kagel, MD   10 mg at 07/01/22 0736   magnesium hydroxide (MILK OF MAGNESIA) suspension 30 mL  30 mL Oral Daily PRN Rankin, Shuvon B, NP       OLANZapine zydis (ZYPREXA) disintegrating tablet 5 mg  5 mg Oral Q6H PRN Abbott Pao, Judithe Keetch, MD       Or   OLANZapine (ZYPREXA) injection 5 mg  5 mg Intramuscular Q6H PRN Abbott Pao, Arcadio Cope, MD       OLANZapine zydis (ZYPREXA) disintegrating tablet 10 mg  10 mg Oral BID Abbott Pao, Batul Diego, MD   10 mg at 07/01/22 0736   traZODone (DESYREL) tablet 150 mg  150 mg Oral QHS PRN Sarita Bottom, MD   150 mg at 06/30/22 2038    Lab Results: No results found for this or any previous visit (from the past 48 hour(s)).   Blood Alcohol level:  Lab Results  Component Value Date   ETH <10 06/23/2022    Metabolic Disorder Labs: Lab Results  Component Value Date   HGBA1C 5.1 06/23/2022   MPG 99.67 06/23/2022   No results found for: "PROLACTIN" Lab Results  Component Value Date   CHOL 164 06/23/2022   TRIG 32 06/23/2022   HDL 63 06/23/2022   CHOLHDL 2.6 06/23/2022   VLDL 6 06/23/2022   LDLCALC 95 06/23/2022    Physical Findings: AIMS: Facial and Oral Movements Muscles of Facial Expression: None, normal Lips and Perioral Area: None, normal Jaw: None, normal Tongue: None, normal,Extremity Movements Upper (arms, wrists, hands, fingers): None, normal Lower (legs, knees, ankles, toes): None, normal, Trunk Movements Neck, shoulders, hips: None, normal, Overall Severity Severity of  abnormal movements (highest score from questions above): None, normal Incapacitation due to abnormal movements: None, normal Patient's awareness of abnormal movements (rate only patient's report): No Awareness, Dental Status Current problems with teeth and/or dentures?: No Does patient usually wear dentures?: No  CIWA:    COWS:     Musculoskeletal: Strength & Muscle Tone: within normal limits Gait & Station: normal Patient leans: N/A  Psychiatric Specialty Exam: General Appearance: Appears at stated age, dressed casually, fairly groomed   Behavior: Guarded and disorganized but cooperative in general   Psychomotor Activity:No psychomotor agitation mild retardation noted      Eye Contact: Poor Speech: Decreased amount otherwise normal     Mood: Euthymic Affect: Restricted to flat affect   Thought Process: Disorganized, thought blocking noted but less Descriptions of Associations: Concrete Thought Content: Hallucinations: Reports visual hallucinations but less frequent and intense, denies auditory hallucinations Delusions: Reports paranoia and delusions but seems to be less preoccupied with his delusions Suicidal Thoughts: Denies SI intention or plan Homicidal Thoughts: Denies HI, intention, plan    Alertness/Orientation: Alert but partially oriented to person and place but not  to time or situation   Insight: poor Judgment: poor   Memory: Government social research officer  Concentration: Limited, easily distracted Attention Span: Poor Recall: Limited Fund of Knowledge: Limited Assets  Assets:Communication Skills; Desire for Improvement; Financial Resources/Insurance; Housing; Resilience; Social Support    Physical Exam: Physical Exam Review of Systems  Constitutional: Negative.   HENT: Negative.    Eyes: Negative.   Respiratory: Negative.    Cardiovascular: Negative.   Gastrointestinal: Negative.   Genitourinary: Negative.   Musculoskeletal: Negative.   Skin:  Negative.   Neurological: Negative.    Blood pressure 113/80, pulse (!) 128, temperature 97.8 F (36.6 C), temperature source Oral, resp. rate 18, height 6\' 3"  (1.905 m), weight 81.6 kg, SpO2 98 %. Body mass index is 22.5 kg/m.   Treatment Plan Summary:  ASSESSMENT:  Diagnoses / Active Problems: Principal Problem: Psychosis ([place]) Diagnosis: Principal Problem:   Psychosis ([place])   PLAN: Safety and Monitoring:             -- Voluntary admission to inpatient psychiatric unit for safety, stabilization and treatment             -- Daily contact with patient to assess and evaluate symptoms and progress in treatment             -- Patient's case to be discussed in multi-disciplinary team meeting             -- Observation Level : q15 minute checks             -- Vital signs:  q12 hours             -- Precautions: suicide, elopement, and assault   2. Medications:               Continue Zyprexa Zydis 10 mg twice daily starting tonight 7/30 for psychosis/helpful with no side effects reported             Zyprexa p.o. or IM as needed for psychotic agitation, monitor need and use.             Vistaril as needed for anxiety            Continue trazodone 150 mg at night as needed for sleep, monitor effects and safety  Continue Claritin 10 mg for possible seasonal allergy  The risks/benefits/side-effects/alternatives to this medication were discussed in detail with the patient and time was given for questions. The patient consents to medication trial.                -- Metabolic profile and EKG monitoring obtained while on an atypical antipsychotic (BMI: Lipid Panel: HbgA1c: QTc:)                            3. Labs Reviewed: CMP within normal limits, lipid profile within normal limits, CBC within normal limits, hemoglobin A1c 5.1, TSH within normal limits UA no UTI noted, urine drug screen negative, CT head no abnormalities, EKG 7/23 QTc 370 B12 and folate within normal limits      Lab  ordered: None  4. Group and Therapy: -- Encouraged patient to participate in unit milieu and in scheduled group therapies    5. Discharge Planning:              -- Social work and case management to assist with discharge planning and identification of hospital follow-up needs prior to discharge             --  Estimated LOS: 5-7 days             -- Discharge Concerns: Need to establish a safety plan; Medication compliance and effectiveness             -- Discharge Goals: Return home with outpatient referrals for mental health follow-up including medication management/psychotherapy     Total Time Spent in Direct Patient Care:  I personally spent 35 minutes on the unit in direct patient care. The direct patient care time included face-to-face time with the patient, reviewing the patient's chart, communicating with other professionals, and coordinating care. Greater than 50% of this time was spent in counseling or coordinating care with the patient regarding goals of hospitalization, psycho-education, and discharge planning needs.   Narcisa Ganesh Winfred Leeds, MD 07/01/2022, 11:33 AM

## 2022-07-01 NOTE — Plan of Care (Signed)
  Problem: Education: Goal: Emotional status will improve Outcome: Progressing Goal: Verbalization of understanding the information provided will improve Outcome: Progressing   Problem: Activity: Goal: Interest or engagement in activities will improve Outcome: Progressing Goal: Sleeping patterns will improve Outcome: Progressing   Problem: Coping: Goal: Ability to demonstrate self-control will improve Outcome: Progressing

## 2022-07-01 NOTE — Progress Notes (Signed)
     07/01/22 0533  Sleep  Number of Hours 7

## 2022-07-01 NOTE — BH IP Treatment Plan (Signed)
Interdisciplinary Treatment and Diagnostic Plan Update  07/01/2022 Time of Session: 0830 Joseph Wilson MRN: [id number]  Principal Diagnosis: Psychosis [facility])  Secondary Diagnoses: Principal Problem:   Psychosis ([place])   Current Medications:  Current Facility-Administered Medications  Medication Dose Route Frequency Provider Last Rate Last Admin   acetaminophen (TYLENOL) tablet 650 mg  650 mg Oral Q6H PRN Rankin, Shuvon B, NP   650 mg at 07/01/22 0735   alum & mag hydroxide-simeth (MAALOX/MYLANTA) 200-200-20 MG/5ML suspension 30 mL  30 mL Oral Q4H PRN Rankin, Shuvon B, NP       feeding supplement (ENSURE ENLIVE / ENSURE PLUS) liquid 237 mL  237 mL Oral BID BM Massengill, Ovid Curd, MD   237 mL at 06/30/22 1450   hydrOXYzine (ATARAX) tablet 25 mg  25 mg Oral TID PRN Dian Situ, MD   25 mg at 06/27/22 2148   loratadine (CLARITIN) tablet 10 mg  10 mg Oral Daily Attiah, Nadir, MD   10 mg at 07/01/22 0736   magnesium hydroxide (MILK OF MAGNESIA) suspension 30 mL  30 mL Oral Daily PRN Rankin, Shuvon B, NP       OLANZapine zydis (ZYPREXA) disintegrating tablet 5 mg  5 mg Oral Q6H PRN Winfred Leeds, Nadir, MD       Or   OLANZapine (ZYPREXA) injection 5 mg  5 mg Intramuscular Q6H PRN Winfred Leeds, Nadir, MD       OLANZapine zydis (ZYPREXA) disintegrating tablet 10 mg  10 mg Oral BID Winfred Leeds, Nadir, MD   10 mg at 07/01/22 0736   traZODone (DESYREL) tablet 150 mg  150 mg Oral QHS PRN Dian Situ, MD   150 mg at 06/30/22 2038   PTA Medications: Medications Prior to Admission  Medication Sig Dispense Refill Last Dose   ASHWAGANDHA PO Take 1 capsule by mouth in the morning and at bedtime.      hydrOXYzine (ATARAX) 25 MG tablet Take 1 tablet (25 mg total) by mouth 3 (three) times daily as needed for anxiety. 30 tablet 0    Multiple Vitamins-Minerals (ADULT GUMMY PO) Take 1 tablet by mouth daily.      neomycin-bacitracin-polymyxin (NEOSPORIN) [phone] ointment Apply 1 Application topically daily as needed  (Apply to affected area).      OLANZapine zydis (ZYPREXA) 5 MG disintegrating tablet Take 1 tablet (5 mg total) by mouth at bedtime.      traZODone (DESYREL) 50 MG tablet Take 1 tablet (50 mg total) by mouth at bedtime as needed for sleep.       Patient Stressors: Other: psychosis    Patient Strengths: Average or above average intelligence  Communication skills  Motivation for treatment/growth  Physical Health  Religious Affiliation  Supportive family/friends   Treatment Modalities: Medication Management, Group therapy, Case management,  1 to 1 session with clinician, Psychoeducation, Recreational therapy.   Physician Treatment Plan for Primary Diagnosis: Psychosis ([place]) Long Term Goal(s): Improvement in symptoms so as ready for discharge   Short Term Goals: Ability to identify changes in lifestyle to reduce recurrence of condition will improve Ability to verbalize feelings will improve Ability to disclose and discuss suicidal ideas Ability to demonstrate self-control will improve  Medication Management: Evaluate patient's response, side effects, and tolerance of medication regimen.  Therapeutic Interventions: 1 to 1 sessions, Unit Group sessions and Medication administration.  Evaluation of Outcomes: Not Met  Physician Treatment Plan for Secondary Diagnosis: Principal Problem:   Psychosis ([place])  Long Term Goal(s): Improvement in symptoms so as ready for discharge  Short Term Goals: Ability to identify changes in lifestyle to reduce recurrence of condition will improve Ability to verbalize feelings will improve Ability to disclose and discuss suicidal ideas Ability to demonstrate self-control will improve     Medication Management: Evaluate patient's response, side effects, and tolerance of medication regimen.  Therapeutic Interventions: 1 to 1 sessions, Unit Group sessions and Medication administration.  Evaluation of Outcomes: Not Met   RN Treatment Plan for  Primary Diagnosis: Psychosis ([place]) Long Term Goal(s): Knowledge of disease and therapeutic regimen to maintain health will improve  Short Term Goals: Ability to remain free from injury will improve, Ability to verbalize frustration and anger appropriately will improve, Ability to demonstrate self-control, Ability to participate in decision making will improve, Ability to verbalize feelings will improve, Ability to disclose and discuss suicidal ideas, Ability to identify and develop effective coping behaviors will improve, and Compliance with prescribed medications will improve  Medication Management: RN will administer medications as ordered by provider, will assess and evaluate patient's response and provide education to patient for prescribed medication. RN will report any adverse and/or side effects to prescribing provider.  Therapeutic Interventions: 1 on 1 counseling sessions, Psychoeducation, Medication administration, Evaluate responses to treatment, Monitor vital signs and CBGs as ordered, Perform/monitor CIWA, COWS, AIMS and Fall Risk screenings as ordered, Perform wound care treatments as ordered.  Evaluation of Outcomes: Not Met   LCSW Treatment Plan for Primary Diagnosis: Psychosis ([place]) Long Term Goal(s): Safe transition to appropriate next level of care at discharge, Engage patient in therapeutic group addressing interpersonal concerns.  Short Term Goals: Engage patient in aftercare planning with referrals and resources, Increase social support, Increase ability to appropriately verbalize feelings, Increase emotional regulation, Facilitate acceptance of mental health diagnosis and concerns, Facilitate patient progression through stages of change regarding substance use diagnoses and concerns, Identify triggers associated with mental health/substance abuse issues, and Increase skills for wellness and recovery  Therapeutic Interventions: Assess for all discharge needs, 1 to 1 time with  Social worker, Explore available resources and support systems, Assess for adequacy in community support network, Educate family and significant other(s) on suicide prevention, Complete Psychosocial Assessment, Interpersonal group therapy.  Evaluation of Outcomes: Not Met   Progress in Treatment: Attending groups: Yes. Participating in groups: Yes. Taking medication as prescribed: Yes. Toleration medication: Yes. Family/Significant other contact made: Yes, individual(s) contacted:  Altyreit (father) [phone] Patient understands diagnosis: Yes. Discussing patient identified problems/goals with staff: Yes. Medical problems stabilized or resolved: No. Denies suicidal/homicidal ideation: Yes. Issues/concerns per patient self-inventory: Yes. Other: none  New problem(s) identified: No, Describe:  none  New Short Term/Long Term Goal(s): Patient to work towards elimination of symptoms of psychosis, medication management for mood stabilization; development of comprehensive mental wellness plan.  Patient Goals:  No additional goals identified at this time. Patient to continue to work towards original goals identified in initial treatment team meeting. CSW will remain available to patient should they voice additional treatment goals.   Discharge Plan or Barriers: No psychosocial barriers identified at this time, patient to return to place of residence when appropriate for discharge.   Reason for Continuation of Hospitalization: Other; describe Psychosis   Estimated Length of Stay: 1-7 days    Scribe for Treatment Team: Larose Kells 07/01/2022 1:35 PM

## 2022-07-02 LAB — CBC WITH DIFFERENTIAL/PLATELET
Abs Immature Granulocytes: 0.01 10*3/uL (ref 0.00–0.07)
Basophils Absolute: 0.1 10*3/uL (ref 0.0–0.1)
Basophils Relative: 1 %
Eosinophils Absolute: 0.1 10*3/uL (ref 0.0–0.5)
Eosinophils Relative: 1 %
HCT: 47.6 % (ref 39.0–52.0)
Hemoglobin: 16.2 g/dL (ref 13.0–17.0)
Immature Granulocytes: 0 %
Lymphocytes Relative: 58 %
Lymphs Abs: 3.1 10*3/uL (ref 0.7–4.0)
MCH: 32.4 pg (ref 26.0–34.0)
MCHC: 34 g/dL (ref 30.0–36.0)
MCV: 95.2 fL (ref 80.0–100.0)
Monocytes Absolute: 0.4 10*3/uL (ref 0.1–1.0)
Monocytes Relative: 7 %
Neutro Abs: 1.8 10*3/uL (ref 1.7–7.7)
Neutrophils Relative %: 33 %
Platelets: 228 10*3/uL (ref 150–400)
RBC: 5 MIL/uL (ref 4.22–5.81)
RDW: 12.2 % (ref 11.5–15.5)
WBC: 5.4 10*3/uL (ref 4.0–10.5)
nRBC: 0 % (ref 0.0–0.2)

## 2022-07-02 LAB — SEDIMENTATION RATE: Sed Rate: 0 mm/hr (ref 0–16)

## 2022-07-02 LAB — HIV ANTIBODY (ROUTINE TESTING W REFLEX): HIV Screen 4th Generation wRfx: NONREACTIVE

## 2022-07-02 NOTE — Progress Notes (Signed)
     07/02/22 0508  Sleep  Number of Hours 7.25    

## 2022-07-02 NOTE — Group Note (Signed)
Recreation Therapy Group Note   Group Topic:Coping Skills  Group Date: 07/02/2022 Start Time: 1000 End Time: 1050 Facilitators: Caroll Rancher, LRT,CTRS Location: [address] Dayroom   Goal Area(s) Addresses:  Patient will successfully define what a coping skill is. Patient will create a visual display of at least 5 positive coping skills. Patient will successfully identify benefit of using outlined coping skills post d/c.     Group Description:  Patients were asked to fill in a coping skills chart using images and words found from magazines. As a group, patients were prompted to discuss what coping skills are, when they need to be utilized, and the importance of selection based on various triggers. Coping skills on the chart were identified by 5 categories - Diversion, Social, Cognitive, Tension Releasers, and Physical. LRT requested that patients represent at least 2 coping skills per category. Patients were then asked to present their collage-style artwork to the group and make suggestions to peers, when necessary, who might be have less skills in certain sections of the chart.   Affect/Mood: Appropriate   Participation Level: Engaged   Participation Quality: Independent   Behavior: Appropriate   Speech/Thought Process: Barely audible  and Focused   Insight: Good   Judgement: Good   Modes of Intervention: Art   Patient Response to Interventions:  Engaged   Education Outcome:  Acknowledges education and In group clarification offered    Clinical Observations/Individualized Feedback: Pt was quiet but appeared focused.  Pt was attentive to peers as they spoke.  Pt gave sex as an example of tension release.  During the activity itself, pt came up with the following coping skills: Diversions- looking at squirrels; Social- public charity such as feeding the homeless; Cognitive- praying; Tension Releaser- sprinting and Physical- taking time to put on lotion.      Plan: Continue  to engage patient in RT group sessions 2-3x/week.   Caroll Rancher, LRT,CTRS 07/02/2022 12:22 PM

## 2022-07-02 NOTE — Progress Notes (Signed)
DAR NOTE: Patient presents with calm affect and  mood.  Denies suicidal thoughts, pain, auditory and visual hallucinations.  Rates depression at 0, hopelessness at 0, and anxiety at 1.  Maintained on routine safety checks.  Medications given as prescribed.  Support and encouragement offered as needed.  Attended group and participated.  States goal for today is "singing three songs in my head."  Patient visible in milieu with minimal interactions.  Patient is safe on and off the unit.  Offered no complaint.

## 2022-07-02 NOTE — BHH Group Notes (Signed)
Pt. Attended group. Pt. Stated that he will be practice sharing.

## 2022-07-02 NOTE — Progress Notes (Deleted)
     07/02/22 0508  Sleep  Number of Hours 7.25

## 2022-07-02 NOTE — Progress Notes (Addendum)
[facility] MD Progress Note  07/02/2022 1:21 PM Joseph Wilson  MRN:  [id number]  Chief Complaint: psychosis  Total Time Spent in Direct Patient Care:  I personally spent 30 minutes on the unit in direct patient care. The direct patient care time included face-to-face time with the patient, reviewing the patient's chart, communicating with other professionals, and coordinating care. Greater than 50% of this time was spent in counseling or coordinating care with the patient regarding goals of hospitalization, psycho-education, and discharge planning needs.  Reason for Admission:  Joseph Wilson is a 21 y.o., male with no past psychiatric history who presents to the [facility] from [facility] for evaluation and management of worsening psychosis and paranoia. According to outside records, the patient presented to the behavioral health unit for [place] via [place] police voluntarily as a walk-in with complaint paranoia, auditory and visual hallucinations.The patient is currently on Hospital Day 8.   Chart Review from last 24 hours:  The patient's chart was reviewed and nursing notes were reviewed. The patient's case was discussed in multidisciplinary team meeting. Per nursing he had no acute behaviaral issues noted and attended groups. Per [facility] he was compliant with scheduled medications and received Vistaril X1 for anxiety, Tylenol X1 for pain, and Trazodone X1 for sleep. Slept 7.25 hours.  Information Obtained Today During Patient Interview: The patient was seen and evaluated on the unit. On assessment today the patient reports that he believes he is going through "spiritual warfare" and describes seeing a dark human-shaped figure behind his chair earlier today. He states that prior to admission he saw "demon flies" on people. He states that he still has occasional AH of a voice that "guides and directs" him but states it is not a voice he recognizes and he is vague as  to when he last had AH. He states that yesterday he was paranoid due to "threats in my head" but states he feels safe today around peers and staff. He reports getting "subliminal messages" prior to admission and states that when he looked at magazines prior to admission he thought the eyes in the photos "looked demonic." He states that since admission he still feels at times that the actors on TV are "making a mockery" of him. He admits to residual belief in thought insertion/withdrawal by unknown entities and admits to belief in thought broadcasting. He denies SI or HI. He reports good sleep and appetite and voices no medication side-effects or physical complaints other than intermittent HA and constipation. We discussed his medication and my recommendation for additional new onset psychosis labs which he agrees to.  Principal Problem: Schizophrenia spectrum disorder with psychotic disorder type not yet determined ([place]) Diagnosis: Principal Problem:   Schizophrenia spectrum disorder with psychotic disorder type not yet determined [facility])  Past Psychiatric History:  Prior Psychiatric diagnoses: None Past Psychiatric Hospitalizations: None History of self mutilation: None Past suicide attempts: None Past history of HI, violent or aggressive behavior: None Past Psychiatric medications trials: None History of ECT/TMS: None Outpatient psychiatric Follow up: None Prior Outpatient Therapy: None  Past Medical History:  Past Medical History:  Diagnosis Date   Acid reflux     Family Psychiatric  History: Psychiatric illness: None reported Suicide: None reported Substance Abuse: None reported  Social History: Living situation: Lives in [place] with his parents, 21 years old sister and 2 brothers ages 47 and 33 years old Social support: Family is supportive Marital Status: Never married Children: No children  Education: Per father patient finished high school Employment: Interior and spatial designer: Denies Legal history: No pending charges or court dates Trauma: None reported Access to guns: The patient's father confirms patient has no access to guns at home  Current Medications: Current Facility-Administered Medications  Medication Dose Route Frequency Provider Last Rate Last Admin   acetaminophen (TYLENOL) tablet 650 mg  650 mg Oral Q6H PRN Rankin, Shuvon B, NP   650 mg at 07/01/22 0735   alum & mag hydroxide-simeth (MAALOX/MYLANTA) 200-200-20 MG/5ML suspension 30 mL  30 mL Oral Q4H PRN Rankin, Shuvon B, NP       feeding supplement (ENSURE ENLIVE / ENSURE PLUS) liquid 237 mL  237 mL Oral BID BM Massengill, Ovid Curd, MD   237 mL at 07/02/22 0817   hydrOXYzine (ATARAX) tablet 25 mg  25 mg Oral TID PRN Dian Situ, MD   25 mg at 07/01/22 2045   loratadine (CLARITIN) tablet 10 mg  10 mg Oral Daily Attiah, Nadir, MD   10 mg at 07/02/22 0817   magnesium hydroxide (MILK OF MAGNESIA) suspension 30 mL  30 mL Oral Daily PRN Rankin, Shuvon B, NP       OLANZapine zydis (ZYPREXA) disintegrating tablet 5 mg  5 mg Oral Q6H PRN Winfred Leeds, Nadir, MD       Or   OLANZapine (ZYPREXA) injection 5 mg  5 mg Intramuscular Q6H PRN Winfred Leeds, Nadir, MD       OLANZapine zydis (ZYPREXA) disintegrating tablet 10 mg  10 mg Oral BID Winfred Leeds, Nadir, MD   10 mg at 07/02/22 0817   traZODone (DESYREL) tablet 150 mg  150 mg Oral QHS PRN Dian Situ, MD   150 mg at 07/01/22 2042    Lab Results: No results found for this or any previous visit (from the past 48 hour(s)).   Blood Alcohol level:  Lab Results  Component Value Date   ETH <10 32/35/5732    Metabolic Disorder Labs: Lab Results  Component Value Date   HGBA1C 5.1 06/23/2022   MPG 99.67 06/23/2022   No results found for: "PROLACTIN" Lab Results  Component Value Date   CHOL 164 06/23/2022   TRIG 32 06/23/2022   HDL 63 06/23/2022   CHOLHDL 2.6 06/23/2022   VLDL 6 06/23/2022   LDLCALC 95 06/23/2022    Physical Findings: AIMS: Facial and Oral  Movements Muscles of Facial Expression: None, normal Lips and Perioral Area: None, normal Jaw: None, normal Tongue: None, normal,Extremity Movements Upper (arms, wrists, hands, fingers): None, normal Lower (legs, knees, ankles, toes): None, normal, Trunk Movements Neck, shoulders, hips: None, normal, Overall Severity Severity of abnormal movements (highest score from questions above): None, normal Incapacitation due to abnormal movements: None, normal Patient's awareness of abnormal movements (rate only patient's report): No Awareness, Dental Status Current problems with teeth and/or dentures?: No Does patient usually wear dentures?: No   Musculoskeletal: Strength & Muscle Tone: within normal limits Gait & Station: normal Patient leans: N/A  Psychiatric Specialty Exam: Physical Exam Vitals and nursing note reviewed.  Constitutional:      Appearance: Normal appearance.  HENT:     Head: Normocephalic.  Pulmonary:     Effort: Pulmonary effort is normal.  Neurological:     General: No focal deficit present.     Mental Status: He is alert.     Review of Systems  Cardiovascular:  Negative for chest pain.  Gastrointestinal:  Positive for constipation. Negative for diarrhea, nausea and vomiting.  Genitourinary:  Negative for  difficulty urinating.  Neurological:  Positive for headaches.    Blood pressure 137/76, pulse (!) 105, temperature 97.6 F (36.4 C), temperature source Oral, resp. rate 18, height $RemoveBe'6\' 3"'mWnSrjxUD$  (1.905 m), weight 81.6 kg, SpO2 96 %.Body mass index is 22.5 kg/m.  General Appearance: Fairly Groomed and casually dressed  Eye Contact:  Fleeting  Speech: clear and coherent, normal fluency - non-spontaneous but answers direct questions  Volume:  Decreased  Mood:   described as "good" - appears aloof  but calm  Affect:  Constricted and guarded  Thought Process:  Linear but concrete  Orientation:  to self, year, month and city  Thought Content:   Endorses belief in  thought insertion/withdrawal/broadcasting, paranoia yesterday but not today; AVH, and ideas of reference - not responding to internal/external stimuli on exam but is guarded appearing  Suicidal Thoughts:  No  Homicidal Thoughts:  No  Memory:   limited secondary to psychosis  Judgement:  Fair - compliant with meds and labs  Insight:  Lacking  Psychomotor Activity:  Normal, no cogwheeling, no stiffness, no tremor, AIMS 0  Concentration:  Concentration: Fair and Attention Span: Fair  Recall:   Limited  Fund of Knowledge:  Fair  Language:  Fair  Akathisia:  Negative  AIMS (if indicated):   0  Assets:  Resilience Social Support  ADL's:  Intact  Sleep:  Number of Hours: 7.25   Treatment Plan Summary: Diagnoses / Active Problems: Unspecified schizophrenia spectrum and other psychotic d/o  PLAN: Safety and Monitoring:             -- Voluntary admission to inpatient psychiatric unit for safety, stabilization and treatment             -- Daily contact with patient to assess and evaluate symptoms and progress in treatment             -- Patient's case to be discussed in multi-disciplinary team meeting             -- Observation Level : q15 minute checks             -- Vital signs:  q12 hours             -- Precautions: suicide, elopement, and assault   2. Psychiatric Diagnoses and Treatment:  Unspecified schizophrenia spectrum and other psychotic d/o   -- Continue Zyprexa Zydis 10 mg twice daily  (Lipid panel WNL, A1c 5.1, QTc 372ms)             -- Continue Vistaril $RemoveBeforeDEI'25mg'TBCJnToAQUmceeOl$  tid as needed for anxiety            -- Continue trazodone 150 mg at night as needed for sleep  -- New onset psychosis w/u (Head CT noncontrast negative on 7/24; folate 13.5, B12 578, UDS negative, TSH 2.775, ETOH <10, Mag 2.0, CMP WNL other than anion gap 4, WBC 3.6 and ANC 1200 with remainder of CBC WNL) will order HIV, RPR, ANA, ceruloplasmin, ESR, heavy metal and B1               3.Medical Issues Being Addressed:    Leukopenia  -- ANC 1200 and WBC 3.6 - will repeat CBC and monitor while on Zyprexa  4. Discharge Planning:   -- Social work and case management to assist with discharge planning and identification of hospital follow-up needs prior to discharge  -- Discharge Concerns: Need to establish a safety plan; Medication compliance and effectiveness  -- Discharge Goals: Return home with  outpatient referrals for mental health follow-up including medication management/psychotherapy  Harlow Asa, MD, FAPA 07/02/2022, 1:21 PM

## 2022-07-02 NOTE — Progress Notes (Signed)
Adult Psychoeducational Group Note  Date:  07/02/2022 Time:  8:38 PM  Group Topic/Focus:  Wrap-Up Group:   The focus of this group is to help patients review their daily goal of treatment and discuss progress on daily workbooks.  Participation Level:  Active  Participation Quality:  Appropriate  Affect:  Appropriate  Cognitive:  Appropriate  Insight: Appropriate  Engagement in Group:  Developing/Improving  Modes of Intervention:  Discussion  Additional Comments:  Pt stated his goal for today was to focus on his treatment plan. Pt stated he accomplished his goal today. Pt stated he talked with his doctor and social worker about his care today. Pt rated his overall day a 7 out of 10. Pt stated he was able to contact his father and his father coming for visitation tonight improved his overall day. Pt stated he felt better about himself today. Pt stated he was able to attend all meals. Pt stated he took all medications provided today. Pt stated he attend all groups held today. Pt stated his appetite was pretty good today. Pt rated sleep last night was pretty good. Pt stated the goal tonight was to get some rest. Pt stated he had some physical pain tonight. Pt stated he had some mild pain in his chest tonight. Pt rated the mild pain a 4 on the pain level scale. Pt nurse was updated on the situation. Pt deny visual hallucinations issues tonight. But admitted to dealing with auditory issues tonight. Pt nurse was updated on the situation. Pt denies thoughts of harming himself or others. Pt stated he would alert staff if anything changed  Felipa Furnace 07/02/2022, 8:38 PM

## 2022-07-03 LAB — RPR: RPR Ser Ql: NONREACTIVE

## 2022-07-03 MED ORDER — OLANZAPINE 10 MG PO TBDP
10.0000 mg | ORAL_TABLET | Freq: Every day | ORAL | Status: DC
  Administered 2022-07-04 – 2022-07-06 (×3): 10 mg via ORAL
  Filled 2022-07-03 (×5): qty 1

## 2022-07-03 MED ORDER — LORAZEPAM 1 MG PO TABS: 1.0000 mg | ORAL_TABLET | ORAL | Status: DC | PRN

## 2022-07-03 MED ORDER — OLANZAPINE 5 MG PO TBDP: 5.0000 mg | ORAL_TABLET | Freq: Three times a day (TID) | ORAL | Status: DC | PRN

## 2022-07-03 MED ORDER — ZIPRASIDONE MESYLATE 20 MG IM SOLR: 20.0000 mg | INTRAMUSCULAR | Status: DC | PRN

## 2022-07-03 MED ORDER — OLANZAPINE 10 MG PO TBDP
20.0000 mg | ORAL_TABLET | Freq: Every day | ORAL | Status: DC
  Administered 2022-07-03 – 2022-07-05 (×3): 20 mg via ORAL
  Filled 2022-07-03 (×5): qty 2

## 2022-07-03 MED ORDER — TRAZODONE HCL 100 MG PO TABS
100.0000 mg | ORAL_TABLET | Freq: Every evening | ORAL | Status: DC | PRN
  Administered 2022-07-08 – 2022-07-22 (×6): 100 mg via ORAL
  Filled 2022-07-03 (×6): qty 1

## 2022-07-03 NOTE — [facility] Group Notes (Signed)
  Adult Psychoeducational Group Note  Date:  07/03/2022 Time:  9:21 AM  Group Topic/Focus:  Goals Group:   The focus of this group is to help patients establish daily goals to achieve during treatment and discuss how the patient can incorporate goal setting into their daily lives to aide in recovery.  Participation Level:  Minimal  Participation Quality:   Pt was quiet and hard to understand as if he didn't want to participate.  Affect:  Flat  Cognitive:  Lacking  Insight: Limited  Engagement in Group:  Lacking and Resistant  Modes of Intervention:  Clarification and Exploration  Additional Comments:    Joseph Wilson 07/03/2022, 9:21 AM

## 2022-07-03 NOTE — Progress Notes (Addendum)
[facility] MD Progress Note  07/03/2022 7:26 AM Joseph Wilson  MRN:  [id number]  Chief Complaint: psychosis  Total Time Spent in Direct Patient Care:  I personally spent 30 minutes on the unit in direct patient care. The direct patient care time included face-to-face time with the patient, reviewing the patient's chart, communicating with other professionals, and coordinating care. Greater than 50% of this time was spent in counseling or coordinating care with the patient regarding goals of hospitalization, psycho-education, and discharge planning needs.  Reason for Admission:  Givanni KI CORBO is a 21 y.o., male with no past psychiatric history who presents to the [facility] from [facility] for evaluation and management of worsening psychosis and paranoia. According to outside records, the patient presented to the behavioral health unit for [place] via [place] police voluntarily as a walk-in with complaint paranoia, auditory and visual hallucinations.The patient is currently on Hospital Day 9.   Chart Review from last 24 hours:  The patient's chart was reviewed and nursing notes were reviewed. The patient's case was discussed in multidisciplinary team meeting. Per nursing he attended groups but reported some residual AH. He had no acute behavioral issues noted. Per [facility] he was compliant with scheduled medications and received Tylenol X1 PRN and Trazodone X1 PRN.  Information Obtained Today During Patient Interview: The patient was seen and evaluated on the unit. He states his residual AVH are improving in terms of frequency and intensity but he will not discuss content of AVH today. He denies feeling paranoid on the unit but continues to worry that scientists have "made a deal with the devil" and "want to castrate him" in order to "make him gay." He states the messages he is getting from the TV are improving but cannot give an example of messages he feels he is  receiving. He continues to endorse belief in thought broadcasting/insertion/withdrawal but states this is lessened today. He denies SI or HI. He reports good sleep and appetite and voices no physical complaints other than intermittent sharp CP worse with movement. We discussed plans to titrate up further on his Zyprexa which he consents to.   Collateral: I spoke to the patient's father with patient's consent for 15 minutes. Father feels his symptoms started after a dose of Omeprazole - symptoms have been present for a little over a year. He states there is no known psychotic illness in the family. Father states he is religious at baseline. He states the patient previously did not want to take medications and had seen various therapists and 3 different psychiatrists for intakes prior to admission but did not receive a formal diagnosis. I updated father on his progress and my suggestion to titrate above FDA recommended dosing of Zyprexa to see if he gets added benefit. Time was given for questions. Differential diagnosis was discussed.   Principal Problem: Schizophrenia spectrum disorder with psychotic disorder type not yet determined ([place]) Diagnosis: Principal Problem:   Schizophrenia spectrum disorder with psychotic disorder type not yet determined [facility])  Past Psychiatric History: see H&P  Past Medical History:  Past Medical History:  Diagnosis Date   Acid reflux     Family Psychiatric  History: Psychiatric illness: None reported Suicide: None reported Substance Abuse: None reported  Social History: see H&P  Current Medications: Current Facility-Administered Medications  Medication Dose Route Frequency Provider Last Rate Last Admin   acetaminophen (TYLENOL) tablet 650 mg  650 mg Oral Q6H PRN Rankin, Shuvon B, NP   650  mg at 07/02/22 2052   alum & mag hydroxide-simeth (MAALOX/MYLANTA) 200-200-20 MG/5ML suspension 30 mL  30 mL Oral Q4H PRN Rankin, Shuvon B, NP       feeding supplement  (ENSURE ENLIVE / ENSURE PLUS) liquid 237 mL  237 mL Oral BID BM Massengill, Ovid Curd, MD   237 mL at 07/02/22 0817   hydrOXYzine (ATARAX) tablet 25 mg  25 mg Oral TID PRN Dian Situ, MD   25 mg at 07/01/22 2045   loratadine (CLARITIN) tablet 10 mg  10 mg Oral Daily Winfred Leeds, Nadir, MD   10 mg at 07/02/22 0817   magnesium hydroxide (MILK OF MAGNESIA) suspension 30 mL  30 mL Oral Daily PRN Rankin, Shuvon B, NP       OLANZapine zydis (ZYPREXA) disintegrating tablet 5 mg  5 mg Oral Q6H PRN Winfred Leeds, Nadir, MD       Or   OLANZapine (ZYPREXA) injection 5 mg  5 mg Intramuscular Q6H PRN Winfred Leeds, Nadir, MD       OLANZapine zydis (ZYPREXA) disintegrating tablet 10 mg  10 mg Oral BID Winfred Leeds, Nadir, MD   10 mg at 07/02/22 2048   traZODone (DESYREL) tablet 150 mg  150 mg Oral QHS PRN Dian Situ, MD   150 mg at 07/02/22 2049    Lab Results:  Results for orders placed or performed during the hospital encounter of 06/24/22 (from the past 48 hour(s))  HIV Antibody (routine testing w rflx)     Status: None   Collection Time: 07/02/22  6:22 PM  Result Value Ref Range   HIV Screen 4th Generation wRfx Non Reactive Non Reactive    Comment: Performed at [facility], [place] [address], [place], [place] [address]  Sedimentation rate     Status: None   Collection Time: 07/02/22  6:22 PM  Result Value Ref Range   Sed Rate 0 0 - 16 mm/hr    Comment: Performed at [facility], [place] [address], [place], [place] [address]  CBC with Differential/Platelet     Status: None   Collection Time: 07/02/22  6:22 PM  Result Value Ref Range   WBC 5.4 4.0 - 10.5 K/uL   RBC 5.00 4.22 - 5.81 MIL/uL   Hemoglobin 16.2 13.0 - 17.0 g/dL   HCT 47.6 39.0 - 52.0 %   MCV 95.2 80.0 - 100.0 fL   MCH 32.4 26.0 - 34.0 pg   MCHC 34.0 30.0 - 36.0 g/dL   RDW 12.2 11.5 - 15.5 %   Platelets 228 150 - 400 K/uL   nRBC 0.0 0.0 - 0.2 %   Neutrophils Relative % 33 %   Neutro Abs 1.8 1.7 - 7.7 K/uL   Lymphocytes Relative 58  %   Lymphs Abs 3.1 0.7 - 4.0 K/uL   Monocytes Relative 7 %   Monocytes Absolute 0.4 0.1 - 1.0 K/uL   Eosinophils Relative 1 %   Eosinophils Absolute 0.1 0.0 - 0.5 K/uL   Basophils Relative 1 %   Basophils Absolute 0.1 0.0 - 0.1 K/uL   Immature Granulocytes 0 %   Abs Immature Granulocytes 0.01 0.00 - 0.07 K/uL    Comment: Performed at [facility], [place] [address], [place], [place] [address]     Blood Alcohol level:  Lab Results  Component Value Date   [facility] <10 32/44/0102    Metabolic Disorder Labs: Lab Results  Component Value Date   HGBA1C 5.1 06/23/2022   MPG 99.67 06/23/2022  No results found for: "PROLACTIN" Lab Results  Component Value Date   CHOL 164 06/23/2022   TRIG 32 06/23/2022   HDL 63 06/23/2022   CHOLHDL 2.6 06/23/2022   VLDL 6 06/23/2022   LDLCALC 95 06/23/2022    Physical Findings: AIMS: Facial and Oral Movements Muscles of Facial Expression: None, normal Lips and Perioral Area: None, normal Jaw: None, normal Tongue: None, normal,Extremity Movements Upper (arms, wrists, hands, fingers): None, normal Lower (legs, knees, ankles, toes): None, normal, Trunk Movements Neck, shoulders, hips: None, normal, Overall Severity Severity of abnormal movements (highest score from questions above): None, normal Incapacitation due to abnormal movements: None, normal Patient's awareness of abnormal movements (rate only patient's report): No Awareness, Dental Status Current problems with teeth and/or dentures?: No Does patient usually wear dentures?: No   Musculoskeletal: Strength & Muscle Tone: within normal limits Gait & Station: normal Patient leans: N/A  Psychiatric Specialty Exam: Physical Exam Vitals and nursing note reviewed.  Constitutional:      Appearance: Normal appearance.  HENT:     Head: Normocephalic.  Pulmonary:     Effort: Pulmonary effort is normal.  Neurological:     General: No focal deficit present.     Mental  Status: He is alert.     Review of Systems  Cardiovascular:  Positive for chest pain.  Gastrointestinal:  Negative for constipation, diarrhea, nausea and vomiting.  Genitourinary:  Negative for difficulty urinating.  Neurological:  Negative for headaches.    Blood pressure 120/80, pulse 95, temperature (!) 97.3 F (36.3 C), temperature source Oral, resp. rate 16, height '6\' 3"'  (1.905 m), weight 81.6 kg, SpO2 100 %.Body mass index is 22.5 kg/m.  General Appearance: Fairly Groomed and casually dressed  Eye Contact:  Fair  Speech: clear and coherent, normal fluency - non-spontaneous but answers direct questions  Volume:  Decreased  Mood:  described as "okay" - appears calm but aloof  Affect:  Constricted and guarded  Thought Process:  Linear but concrete  Orientation:  to self, year, month and city - knows he is in the hospital for treatment  Thought Content:   Endorses belief in thought insertion/withdrawal/broadcasting, denies paranoia but is guarded on exam; reports improving ideas of reference and continues to have hyper-religious references and delusion that he will be castrated; is not grossly responding to internal/external stimuli on exam  Suicidal Thoughts:  No  Homicidal Thoughts:  No  Memory:   limited secondary to psychosis  Judgement:  Fair - compliant with meds and labs  Insight:  Shallow  Psychomotor Activity:  Normal, no cogwheeling, no stiffness, no tremor, AIMS 0  Concentration:  Concentration: Fair and Attention Span: Fair  Recall:   Limited secondary to psychosis  Fund of Knowledge:  Fair  Language:  Good  Akathisia:  Negative  AIMS (if indicated):   0  Assets:  Resilience Social Support  ADL's:  Intact  Sleep:  total time unrecorded   Treatment Plan Summary: Diagnoses / Active Problems: Unspecified schizophrenia spectrum and other psychotic d/o (r/o schizophreniform d/o, r/o schizophrenia, r/o psychosis secondary to general medical condition)  PLAN: Safety  and Monitoring:             -- Voluntary admission to inpatient psychiatric unit for safety, stabilization and treatment             -- Daily contact with patient to assess and evaluate symptoms and progress in treatment             --  Patient's case to be discussed in multi-disciplinary team meeting             -- Observation Level : q15 minute checks             -- Vital signs:  q12 hours             -- Precautions: suicide, elopement, and assault   2. Psychiatric Diagnoses and Treatment:  Unspecified schizophrenia spectrum and other psychotic d/o   -- Continue Zyprexa Zydis 6m qam and increase to 276mqhs (above FDA recommended dosing but has had some improvement at therapeutic dose and continues to have residual psychosis) (Lipid panel WNL, A1c 5.1, QTc 37038m- rechecking EKG for QTC monitoring             -- Continue Vistaril 44m34md as needed for anxiety            -- Reduce trazodone to 100mg88mnight as needed for sleep given titration up on Zyprexa  -- New onset psychosis w/u (Head CT noncontrast negative on 7/24; folate 13.5, B12 578, UDS negative, TSH 2.775, ETOH <10, Mag 2.0, CMP WNL other than anion gap 4, repeat CBC WNL, ESR 0, HIV nonreactive, RPR nonreactive)  ANA, ceruloplasmin, heavy metal and B1 pending               3.Medical Issues Being Addressed:   Leukopenia - resolved  -- Repeat WBC 5.4 and ANC 1800 on 8/1 - continue to monitor while on atypical antipsychotic  4. Discharge Planning:   -- Social work and case management to assist with discharge planning and identification of hospital follow-up needs prior to discharge  -- Discharge Concerns: Need to establish a safety plan; Medication compliance and effectiveness  -- Discharge Goals: Return home with outpatient referrals for mental health follow-up including medication management/psychotherapy  Roxanne Orner EHarlow Asa FAPA 07/03/2022, 7:26 AM

## 2022-07-03 NOTE — Group Note (Signed)
Recreation Therapy Group Note   Group Topic:Team Building  Group Date: 07/03/2022 Start Time: 1010 End Time: 1100 Facilitators: Caroll Rancher, LRT,CTRS Location: [address] Dayroom   Goal Area(s) Addresses:  Patient will effectively work with peer towards shared goal.  Patient will identify skills used to make activity successful.  Patient will identify how skills used during activity can be used to reach post d/c goals.    Group Description:  Straw Bridge. In teams of 3-5, patients were given 15 plastic drinking straws and an equal length of masking tape. Using the materials provided, patients were instructed to build a free standing bridge-like structure to suspend an everyday item (ex: puzzle box) off of the floor or table surface. All materials were required to be used by the team in their design. LRT facilitated post-activity discussion reviewing team process. Patients were encouraged to reflect how the skills used in this activity can be generalized to daily life post discharge.    Affect/Mood: N/A   Participation Level: Did not attend    Clinical Observations/Individualized Feedback:     Plan: Continue to engage patient in RT group sessions 2-3x/week.   Caroll Rancher, LRT,CTRS  07/03/2022 1:13 PM

## 2022-07-03 NOTE — Progress Notes (Signed)
   07/03/22 1121  Psych Admission Type (Psych Patients Only)  Admission Status Voluntary  Psychosocial Assessment  Patient Complaints Suspiciousness  Eye Contact Intense  Facial Expression Anxious  Affect Preoccupied  Speech Soft  Interaction Minimal;Cautious  Motor Activity Other (Comment) (WNL)  Appearance/Hygiene Unremarkable  Behavior Characteristics Cooperative;Calm  Thought Process  Coherency Blocking  Content Preoccupation  Delusions Paranoid  Perception Hallucinations  Hallucination Auditory;Visual  Judgment Impaired  Confusion None  Danger to Self  Current suicidal ideation? Denies  Danger to Others  Danger to Others None reported or observed  Danger to Others Abnormal  Harmful Behavior to others No threats or harm toward other people  Destructive Behavior No threats or harm toward property

## 2022-07-03 NOTE — Progress Notes (Signed)
   07/03/22 0000  Psych Admission Type (Psych Patients Only)  Admission Status Voluntary  Psychosocial Assessment  Patient Complaints Anxiety;Suspiciousness;Nervousness  Eye Contact Intense  Facial Expression Anxious;Wide-eyed  Affect Preoccupied  Speech Soft  Interaction Cautious  Motor Activity Slow  Appearance/Hygiene Unremarkable  Behavior Characteristics Cooperative;Anxious  Mood Preoccupied  Thought Process  Coherency Blocking  Content Preoccupation  Delusions Paranoid  Perception Hallucinations  Hallucination Auditory;Visual  Judgment Poor  Confusion None  Danger to Self  Current suicidal ideation? Denies  Danger to Others  Danger to Others None reported or observed  Danger to Others Abnormal  Harmful Behavior to others No threats or harm toward other people  Destructive Behavior No threats or harm toward property

## 2022-07-03 NOTE — Progress Notes (Signed)
     07/03/22 2103  Psych Admission Type (Psych Patients Only)  Admission Status Voluntary  Psychosocial Assessment  Patient Complaints Suspiciousness  Eye Contact Intense  Facial Expression Anxious;Sad  Affect Preoccupied  Speech Soft  Interaction Cautious;Minimal  Motor Activity Other (Comment) (WNL)  Appearance/Hygiene Unremarkable  Behavior Characteristics Cooperative;Calm  Mood Pleasant;Preoccupied  Thought Process  Coherency Blocking  Content Preoccupation  Delusions Paranoid  Perception Hallucinations  Hallucination Auditory;Visual  Judgment Impaired  Confusion None  Danger to Self  Current suicidal ideation? Denies  Danger to Others  Danger to Others None reported or observed  Danger to Others Abnormal  Harmful Behavior to others No threats or harm toward other people  Destructive Behavior No threats or harm toward property

## 2022-07-03 NOTE — Plan of Care (Signed)
  Problem: Education: Goal: Verbalization of understanding the information provided will improve Outcome: Progressing   Problem: Activity: Goal: Interest or engagement in activities will improve Outcome: Progressing Goal: Sleeping patterns will improve Outcome: Progressing   Problem: Coping: Goal: Ability to demonstrate self-control will improve Outcome: Progressing

## 2022-07-03 NOTE — Group Note (Signed)
[facility] LCSW Group Therapy Note  Date/Time: 1pm on 07/03/2022  Type of Therapy and Topic:  Group Therapy:  Who Am I?  Self Esteem, Self-Actualization and Understanding Self.  Participation Level: Active   Description of Group:    In this group patients will be asked to explore values, beliefs, truths, and morals as they relate to personal self.  Patients will be guided to discuss their thoughts, feelings, and behaviors related to what they identify as important to their true self. Patients will process together how values, beliefs and truths are connected to specific choices patients make every day. Each patient will be challenged to identify changes that they are motivated to make in order to improve self-esteem and self-actualization. This group will be process-oriented, with patients participating in exploration of their own experiences as well as giving and receiving support and challenge from other group members.  Therapeutic Goals: Patient will identify false beliefs that currently interfere with their self-esteem.  Patient will identify feelings, thought process, and behaviors related to self and will become aware of the uniqueness of themselves and of others.  Patient will be able to identify and verbalize values, morals, and beliefs as they relate to self. Patient will begin to learn how to build self-esteem/self-awareness by expressing what is important and unique to them personally.  Summary of Patient Progress: Patient participated appropriately in group. Patient states that he admires an anime character due to his grit, determination and his ability to keep pushing. Patient participated in group activity and assisted others with identifying values.  Patient had good insight into group topic.        Therapeutic Modalities:   Cognitive Behavioral Therapy Solution Focused Therapy Motivational Interviewing Brief Therapy   Fritzi Scripter, LCSW, LCAS Clincal Social Worker  [facility]

## 2022-07-03 NOTE — Plan of Care (Signed)
  Problem: Activity: Goal: Interest or engagement in activities will improve Outcome: Progressing   Problem: Coping: Goal: Ability to verbalize frustrations and anger appropriately will improve Outcome: Progressing   Problem: Coping: Goal: Ability to demonstrate self-control will improve Outcome: Progressing   Problem: Safety: Goal: Periods of time without injury will increase Outcome: Progressing   Problem: Health Behavior/Discharge Planning: Goal: Compliance with therapeutic regimen will improve Outcome: Progressing

## 2022-07-03 NOTE — Plan of Care (Signed)
  Problem: Education: Goal: Mental status will improve Outcome: Progressing   

## 2022-07-03 NOTE — [facility] Group Notes (Signed)
Pt did not attend wrap up group this evening. Pt was in their room showering.

## 2022-07-04 LAB — CERULOPLASMIN: Ceruloplasmin: 13.8 mg/dL — ABNORMAL LOW (ref 16.0–31.0)

## 2022-07-04 LAB — SARS CORONAVIRUS 2 BY RT PCR: SARS Coronavirus 2 by RT PCR: NEGATIVE

## 2022-07-04 LAB — ANA W/REFLEX IF POSITIVE: Anti Nuclear Antibody (ANA): NEGATIVE

## 2022-07-04 NOTE — Plan of Care (Signed)
  Problem: Coping: Goal: Ability to verbalize frustrations and anger appropriately will improve Outcome: Progressing Goal: Ability to demonstrate self-control will improve Outcome: Progressing   Problem: Safety: Goal: Periods of time without injury will increase Outcome: Progressing   Problem: Education: Goal: Utilization of techniques to improve thought processes will improve Outcome: Progressing Goal: Knowledge of the prescribed therapeutic regimen will improve Outcome: Progressing

## 2022-07-04 NOTE — Group Note (Signed)
Recreation Therapy Group Note   Group Topic:Leisure Education  Group Date: 07/04/2022 Start Time: 1000 End Time: 1045 Facilitators: Caroll Rancher, LRT,CTRS Location: [address] Dayroom   Goal Area(s) Addresses:  Patient will review and complete packet supporting identification of healthy leisure and recreation activities.    Clinical Observations/Individualized Feedback:   Recreation therapy group did not occur due COVID being on the unit.  LRT provided pt a workbook reviewing leisure and its impact on emotional states and personal fulfillment. Pt given the option to complete the packet in the dayroom or at their own pace in their room.   Plan: Continue to engage patient in RT group sessions 2-3x/week.   Caroll Rancher, LRT,CTRS 07/04/2022 11:57 AM

## 2022-07-04 NOTE — [facility] Group Notes (Signed)
Adult Psychoeducational Group Note  Date:  07/04/2022 Time:  8:23 PM  Group Topic/Focus:  Wrap-Up Group:   The focus of this group is to help patients review their daily goal of treatment and discuss progress on daily workbooks.  Participation Level:  Active  Participation Quality:  Attentive  Affect:  Appropriate  Cognitive:  Alert  Insight: Appropriate  Engagement in Group:  Engaged  Modes of Intervention:  Discussion  Additional Comments:  Patient attended and participated in the Wrap-up group.  Jearl Klinefelter 07/04/2022, 8:23 PM

## 2022-07-04 NOTE — Progress Notes (Signed)
    07/04/22 0500  Sleep  Number of Hours 7

## 2022-07-04 NOTE — [facility] Group Notes (Signed)
Patient did not attend the relaxation group. 

## 2022-07-04 NOTE — Progress Notes (Signed)
   07/04/22 2115  Psych Admission Type (Psych Patients Only)  Admission Status Voluntary  Psychosocial Assessment  Patient Complaints Suspiciousness  Eye Contact Glaring  Facial Expression Flat  Affect Preoccupied  Speech Soft  Interaction Cautious;Forwards little;Minimal  Motor Activity Slow  Appearance/Hygiene Unremarkable  Behavior Characteristics Cooperative;Calm  Mood Preoccupied;Suspicious;Pleasant  Aggressive Behavior  Effect No apparent injury  Thought Process  Coherency Blocking  Content Preoccupation  Delusions Paranoid  Perception Hallucinations  Hallucination Auditory;Visual  Judgment Impaired  Confusion None  Danger to Self  Current suicidal ideation? Denies  Danger to Others  Danger to Others None reported or observed  Danger to Others Abnormal  Harmful Behavior to others No threats or harm toward other people

## 2022-07-04 NOTE — Progress Notes (Signed)
[facility] MD Progress Note  07/04/2022 9:06 AM Joseph Wilson  MRN:  [id number]  Chief Complaint: psychosis  Total Time Spent in Direct Patient Care:  I personally spent 25 minutes on the unit in direct patient care. The direct patient care time included face-to-face time with the patient, reviewing the patient's chart, communicating with other professionals, and coordinating care. Greater than 50% of this time was spent in counseling or coordinating care with the patient regarding goals of hospitalization, psycho-education, and discharge planning needs.  Reason for Admission:  Joseph Wilson is a 21 y.o., male with no past psychiatric history who presents to the [facility] from [facility] for evaluation and management of worsening psychosis and paranoia. According to outside records, the patient presented to the behavioral health unit for [place] via [place] police voluntarily as a walk-in with complaint paranoia, auditory and visual hallucinations.The patient is currently on Hospital Day 10.   Chart Review from last 24 hours:  The patient's chart was reviewed and nursing notes were reviewed. The patient's case was discussed in multidisciplinary team meeting. Per nursing he had some thought blocking and guarded behaviors on the unit. He attended 2 groups. He had no acute behavioral issues noted. Per MAR, he was compliant with scheduled medications and did not require PRNs.  Information Obtained Today During Patient Interview: The patient was seen and evaluated on the unit. He states he slept well overnight and reports good appetite. He states he visited with his mother last night and they talked "about God" his symptoms and his discharge plans once he is home. He states his residual AH are greatly reduced today. He states he still sometimes hears whispers/mumbles and occasional clear, non-commanding voices. He reports having intermittent VH of shadows above his  head or running in his field of vision. He endorses belief that Edmon Crape can read his mind, that he can move things with his mind, and has belief his brain "is like a muscle I can flex." He states when he "squeezes his brain" he makes things happen. He admits to some residual belief in telepathy but does not make hyper-religious statements today. He does not mention fear of castration today. He states he was a little paranoid on the unit yesterday but denies paranoia today. He states his mood is "good" and he denies SI or HI. He voices no physical complaints today. He states he is showering. He denies medication side-effects.  Principal Problem: Schizophrenia spectrum disorder with psychotic disorder type not yet determined ([place]) Diagnosis: Principal Problem:   Schizophrenia spectrum disorder with psychotic disorder type not yet determined [facility])  Past Psychiatric History: see H&P  Past Medical History:  Past Medical History:  Diagnosis Date   Acid reflux     Family Psychiatric  History: Psychiatric illness: None reported Suicide: None reported Substance Abuse: None reported  Social History: see H&P  Current Medications: Current Facility-Administered Medications  Medication Dose Route Frequency Provider Last Rate Last Admin   acetaminophen (TYLENOL) tablet 650 mg  650 mg Oral Q6H PRN Rankin, Shuvon B, NP   650 mg at 07/02/22 2052   alum & mag hydroxide-simeth (MAALOX/MYLANTA) 200-200-20 MG/5ML suspension 30 mL  30 mL Oral Q4H PRN Rankin, Shuvon B, NP       feeding supplement (ENSURE ENLIVE / ENSURE PLUS) liquid 237 mL  237 mL Oral BID BM Massengill, Ovid Curd, MD   237 mL at 07/04/22 0838   hydrOXYzine (ATARAX) tablet 25 mg  25 mg  Oral TID PRN Dian Situ, MD   25 mg at 07/01/22 2045   loratadine (CLARITIN) tablet 10 mg  10 mg Oral Daily Winfred Leeds, Nadir, MD   10 mg at 07/04/22 0838   OLANZapine zydis (ZYPREXA) disintegrating tablet 5 mg  5 mg Oral Q8H PRN Harlow Asa, MD       And    LORazepam (ATIVAN) tablet 1 mg  1 mg Oral PRN Harlow Asa, MD       And   ziprasidone (GEODON) injection 20 mg  20 mg Intramuscular PRN Nelda Marseille, Kalyssa Anker E, MD       magnesium hydroxide (MILK OF MAGNESIA) suspension 30 mL  30 mL Oral Daily PRN Rankin, Shuvon B, NP       OLANZapine zydis (ZYPREXA) disintegrating tablet 10 mg  10 mg Oral Daily Nelda Marseille, Keiasia Christianson E, MD   10 mg at 07/04/22 0838   OLANZapine zydis (ZYPREXA) disintegrating tablet 20 mg  20 mg Oral QHS Nelda Marseille, Ramsey Midgett E, MD   20 mg at 07/03/22 2103   traZODone (DESYREL) tablet 100 mg  100 mg Oral QHS PRN Harlow Asa, MD        Lab Results:  Results for orders placed or performed during the hospital encounter of 06/24/22 (from the past 48 hour(s))  RPR     Status: None   Collection Time: 07/02/22  6:22 PM  Result Value Ref Range   RPR Ser Ql NON REACTIVE NON REACTIVE    Comment: Performed at [facility], [place] [address], [place], [place] [address]  HIV Antibody (routine testing w rflx)     Status: None   Collection Time: 07/02/22  6:22 PM  Result Value Ref Range   HIV Screen 4th Generation wRfx Non Reactive Non Reactive    Comment: Performed at [facility], [place] [address] [place], [place] [address]  Sedimentation rate     Status: None   Collection Time: 07/02/22  6:22 PM  Result Value Ref Range   Sed Rate 0 0 - 16 mm/hr    Comment: Performed at [facility], [place] [address], [place], [place] [address]  CBC with Differential/Platelet     Status: None   Collection Time: 07/02/22  6:22 PM  Result Value Ref Range   WBC 5.4 4.0 - 10.5 K/uL   RBC 5.00 4.22 - 5.81 MIL/uL   Hemoglobin 16.2 13.0 - 17.0 g/dL   HCT 47.6 39.0 - 52.0 %   MCV 95.2 80.0 - 100.0 fL   MCH 32.4 26.0 - 34.0 pg   MCHC 34.0 30.0 - 36.0 g/dL   RDW 12.2 11.5 - 15.5 %   Platelets 228 150 - 400 K/uL   nRBC 0.0 0.0 - 0.2 %   Neutrophils Relative % 33 %   Neutro Abs 1.8 1.7 - 7.7 K/uL   Lymphocytes Relative 58 %   Lymphs Abs  3.1 0.7 - 4.0 K/uL   Monocytes Relative 7 %   Monocytes Absolute 0.4 0.1 - 1.0 K/uL   Eosinophils Relative 1 %   Eosinophils Absolute 0.1 0.0 - 0.5 K/uL   Basophils Relative 1 %   Basophils Absolute 0.1 0.0 - 0.1 K/uL   Immature Granulocytes 0 %   Abs Immature Granulocytes 0.01 0.00 - 0.07 K/uL    Comment: Performed at [facility], [place] [address] [place], [place] [address]     Blood Alcohol level:  Lab Results  Component Value Date  ETH <10 97/67/3419    Metabolic Disorder Labs: Lab Results  Component Value Date   HGBA1C 5.1 06/23/2022   MPG 99.67 06/23/2022   No results found for: "PROLACTIN" Lab Results  Component Value Date   CHOL 164 06/23/2022   TRIG 32 06/23/2022   HDL 63 06/23/2022   CHOLHDL 2.6 06/23/2022   VLDL 6 06/23/2022   LDLCALC 95 06/23/2022    Physical Findings: AIMS: Facial and Oral Movements Muscles of Facial Expression: None, normal Lips and Perioral Area: None, normal Jaw: None, normal Tongue: None, normal,Extremity Movements Upper (arms, wrists, hands, fingers): None, normal Lower (legs, knees, ankles, toes): None, normal, Trunk Movements Neck, shoulders, hips: None, normal, Overall Severity Severity of abnormal movements (highest score from questions above): None, normal Incapacitation due to abnormal movements: None, normal Patient's awareness of abnormal movements (rate only patient's report): No Awareness, Dental Status Current problems with teeth and/or dentures?: No Does patient usually wear dentures?: No   Musculoskeletal: Strength & Muscle Tone: within normal limits Gait & Station: normal Patient leans: N/A  Psychiatric Specialty Exam: Physical Exam Vitals and nursing note reviewed.  Constitutional:      Appearance: Normal appearance.  HENT:     Head: Normocephalic.  Pulmonary:     Effort: Pulmonary effort is normal.  Neurological:     General: No focal deficit present.     Mental Status: He is  alert.     Review of Systems  Cardiovascular:  Negative for chest pain.  Gastrointestinal:  Negative for constipation, diarrhea, nausea and vomiting.  Genitourinary:  Negative for difficulty urinating.  Neurological:  Negative for headaches.    Blood pressure 107/83, pulse 92, temperature 97.9 F (36.6 C), temperature source Oral, resp. rate 16, height '6\' 3"'  (1.905 m), weight 81.6 kg, SpO2 98 %.Body mass index is 22.5 kg/m.  General Appearance: Fairly Groomed and casually dressed  Eye Contact:  Fair  Speech: clear and coherent, normal fluency - non-spontaneous but answers direct questions  Volume:  Decreased  Mood:  described as "good" - appears calm but aloof  Affect:  Constricted and guarded  Thought Process:  Linear but concrete with intermittent thought blocking  Orientation:  to self, year, month and city   Thought Content:   Endorses belief that Edmon Crape can read his mind, that he can read minds and move things with his mind; has ideas of influence that he can make things happen by flexing his brain; appears guarded on exam with intermittent thought blocking; denies SI or HI; reports improvement in residual AVH  Suicidal Thoughts:  No  Homicidal Thoughts:  No  Memory:   limited secondary to psychosis  Judgement:  Fair - compliant with meds and labs  Insight:  Shallow  Psychomotor Activity:  Normal, no cogwheeling, no stiffness, no tremor, AIMS 0  Concentration:  Concentration: Fair and Attention Span: Fair  Recall:   Limited secondary to psychosis  Fund of Knowledge:  Fair  Language:  Fair  Akathisia:  Negative  AIMS (if indicated):   0  Assets:  Resilience Social Support  ADL's:  Intact  Sleep: 7 hours   Treatment Plan Summary: Diagnoses / Active Problems: Unspecified schizophrenia spectrum and other psychotic d/o (r/o schizophreniform d/o, r/o schizophrenia, r/o psychosis secondary to general medical condition)  PLAN: Safety and Monitoring:             --  Voluntary admission to inpatient psychiatric unit for safety, stabilization and treatment             --  Daily contact with patient to assess and evaluate symptoms and progress in treatment             -- Patient's case to be discussed in multi-disciplinary team meeting             -- Observation Level : q15 minute checks             -- Vital signs:  q12 hours             -- Precautions: suicide, elopement, and assault   2. Psychiatric Diagnoses and Treatment:  Unspecified schizophrenia spectrum and other psychotic d/o   -- Continue Zyprexa Zydis 25m qam and 275mqhs (above FDA recommended dosing but has had some improvement at therapeutic dose and continues to have residual psychosis) (Lipid panel WNL, A1c 5.1, QTc 37058mnd on repeat 8/2 393m60m            -- Continue Vistaril 25mg76m as needed for anxiety            -- Continue trazodone 100mg 23might as needed for sleep   -- New onset psychosis w/u (Head CT noncontrast negative on 7/24; folate 13.5, B12 578, UDS negative, TSH 2.775, ETOH <10, Mag 2.0, CMP WNL other than anion gap 4, repeat CBC WNL, ESR 0, HIV nonreactive, RPR nonreactive)  ANA, ceruloplasmin, heavy metal and B1 pending               3.Medical Issues Being Addressed:   Leukopenia - resolved  -- Repeat WBC 5.4 and ANC 1800 on 8/1 - continue to monitor while on atypical antipsychotic  4. Discharge Planning:   -- Social work and case management to assist with discharge planning and identification of hospital follow-up needs prior to discharge  -- Discharge Concerns: Need to establish a safety plan; Medication compliance and effectiveness  -- Discharge Goals: Return home with outpatient referrals for mental health follow-up including medication management/psychotherapy  Taralee Marcus E Harlow AsaFAPA 07/04/2022, 9:06 AM

## 2022-07-04 NOTE — Progress Notes (Signed)
   07/04/22 0832  Psych Admission Type (Psych Patients Only)  Admission Status Voluntary  Psychosocial Assessment  Patient Complaints Suspiciousness  Eye Contact Intense  Facial Expression Flat;Sad  Affect Preoccupied  Speech Soft  Interaction Cautious;Minimal  Motor Activity Other (Comment) (wnl)  Appearance/Hygiene Unremarkable  Behavior Characteristics Cooperative;Calm  Mood Preoccupied;Pleasant  Aggressive Behavior  Effect No apparent injury  Thought Process  Coherency Blocking  Content Preoccupation  Delusions Paranoid  Perception Hallucinations  Hallucination Auditory;Visual  Judgment Impaired  Confusion None  Danger to Self  Current suicidal ideation? Denies  Danger to Others  Danger to Others None reported or observed  Danger to Others Abnormal  Harmful Behavior to others No threats or harm toward other people  Destructive Behavior No threats or harm toward property

## 2022-07-05 ENCOUNTER — Encounter (HOSPITAL_COMMUNITY): Payer: Self-pay

## 2022-07-05 LAB — HEAVY METALS, BLOOD
Arsenic: 2 ug/L (ref 0–9)
Lead: <1 ug/dL (ref 0.0–3.4)
Mercury: <1 ug/L (ref 0.0–14.9)

## 2022-07-05 MED ORDER — BENZTROPINE MESYLATE 1 MG PO TABS
1.0000 mg | ORAL_TABLET | Freq: Two times a day (BID) | ORAL | Status: DC | PRN
Start: 2022-07-05 — End: 2022-07-08

## 2022-07-05 NOTE — Group Note (Signed)
[facility] LCSW Group Therapy Note   Group Date: 07/05/2022 Start Time: 1100 End Time: 1200   Type of Therapy/Topic:  Group Therapy:  Emotion Regulation  Participation Level:  Active   Mood: Frustrated  Description of Group:    The purpose of this group is to assist patients in learning to regulate negative emotions and experience positive emotions. Patients will be guided to discuss ways in which they have been vulnerable to their negative emotions. These vulnerabilities will be juxtaposed with experiences of positive emotions or situations, and patients challenged to use positive emotions to combat negative ones. Special emphasis will be placed on coping with negative emotions in conflict situations, and patients will process healthy conflict resolution skills.  Therapeutic Goals: Patient will identify two positive emotions or experiences to reflect on in order to balance out negative emotions:  Patient will label two or more emotions that they find the most difficult to experience:  Patient will be able to demonstrate positive conflict resolution skills through discussion or role plays:   Summary of Patient Progress:   Patient expressed that he was frustrated in his current state due to not having his life go the way he wanted it.  Patient reports that he has been regulating emotions by praying, reading and breathing.  Patient had good insight into group activity and group topic.     Therapeutic Modalities:   Cognitive Behavioral Therapy Feelings Identification Dialectical Behavioral Therapy   Danah Reinecke E Micha Erck, LCSW

## 2022-07-05 NOTE — [facility] Group Notes (Signed)
  Spirituality group facilitated by Kathleen Argue, BCC.   Group Description: Group focused on topic of hope. Patients participated in facilitated discussion around topic, connecting with one another around experiences and definitions for hope. Group members engaged with visual explorer photos, reflecting on what hope looks like for them today. Group engaged in discussion around how their definitions of hope are present today in hospital.   Modalities: Psycho-social ed, Adlerian, Narrative, MI   Patient Progress: Joseph Wilson attended group and engaged in the conversation.  He participated when invited to do so and his comments were on topic and showed some insight.  [address] [facility] Pager, [phone]

## 2022-07-05 NOTE — Progress Notes (Signed)
Adult Psychoeducational Group Note  Date:  07/05/2022 Time:  8:38 PM  Group Topic/Focus:  Wrap-Up Group:   The focus of this group is to help patients review their daily goal of treatment and discuss progress on daily workbooks.  Participation Level:  Did Not Attend  Participation Quality:  Did Not Attend  Affect:  Did Not Attend  Cognitive:  Did Not Attend  Insight: Did Not Attend  Engagement in Group:  Did Not Attend  Modes of Intervention:  Did Not Attend  Additional Comments:   Pt was encouraged to attend group discussion but refused   [name] 07/05/2022, 8:38 PM

## 2022-07-05 NOTE — Progress Notes (Signed)
   07/05/22 0500  Sleep  Number of Hours 6.25

## 2022-07-05 NOTE — Group Note (Signed)
Recreation Therapy Group Note   Group Topic:Stress Management  Group Date: 07/05/2022 Start Time: 1000 End Time: 1035 Facilitators: Caroll Rancher, LRT,CTRS Location: [address] Dayroom   Goal Area(s) Addresses:  Patient will identify symptoms of stress. Patient will identify what causes stress. Patient will identify was to cope with stress post d/c.   Group Description:  LRT provided each patient with a packet that went over different elements of stress.  LRT reviewed and went over packet with the group.  LRT and patients discusses the physical, emotional and behavioral symptoms of stress and patients identified the symptoms they experience most.  Patients also identified triggers to stress and identified the things they can control versus what they can't.   Affect/Mood: N/A   Participation Level: Did not attend    Clinical Observations/Individualized Feedback:     Plan: Continue to engage patient in RT group sessions 2-3x/week.   Caroll Rancher, LRT,CTRS 07/05/2022 1:09 PM

## 2022-07-05 NOTE — BH IP Treatment Plan (Signed)
Interdisciplinary Treatment and Diagnostic Plan Update  07/05/2022 Time of Session: update Joseph Wilson MRN: [id number]  Principal Diagnosis: Schizophrenia spectrum disorder with psychotic disorder type not yet determined [facility])  Secondary Diagnoses: Principal Problem:   Schizophrenia spectrum disorder with psychotic disorder type not yet determined (HCC)   Current Medications:  Current Facility-Administered Medications  Medication Dose Route Frequency Provider Last Rate Last Admin   acetaminophen (TYLENOL) tablet 650 mg  650 mg Oral Q6H PRN Rankin, Shuvon B, NP   650 mg at 07/02/22 2052   alum & mag hydroxide-simeth (MAALOX/MYLANTA) 200-200-20 MG/5ML suspension 30 mL  30 mL Oral Q4H PRN Rankin, Shuvon B, NP       feeding supplement (ENSURE ENLIVE / ENSURE PLUS) liquid 237 mL  237 mL Oral BID BM Massengill, Harrold Donath, MD   237 mL at 07/05/22 1045   hydrOXYzine (ATARAX) tablet 25 mg  25 mg Oral TID PRN Sarita Bottom, MD   25 mg at 07/01/22 2045   loratadine (CLARITIN) tablet 10 mg  10 mg Oral Daily Attiah, Nadir, MD   10 mg at 07/05/22 0759   OLANZapine zydis (ZYPREXA) disintegrating tablet 5 mg  5 mg Oral Q8H PRN Comer Locket, MD       And   LORazepam (ATIVAN) tablet 1 mg  1 mg Oral PRN Comer Locket, MD       And   ziprasidone (GEODON) injection 20 mg  20 mg Intramuscular PRN Mason Jim, Amy E, MD       magnesium hydroxide (MILK OF MAGNESIA) suspension 30 mL  30 mL Oral Daily PRN Rankin, Shuvon B, NP       OLANZapine zydis (ZYPREXA) disintegrating tablet 10 mg  10 mg Oral Daily Mason Jim, Amy E, MD   10 mg at 07/05/22 0759   OLANZapine zydis (ZYPREXA) disintegrating tablet 20 mg  20 mg Oral QHS Singleton, Amy E, MD   20 mg at 07/04/22 2057   traZODone (DESYREL) tablet 100 mg  100 mg Oral QHS PRN Comer Locket, MD       PTA Medications: Medications Prior to Admission  Medication Sig Dispense Refill Last Dose   ASHWAGANDHA PO Take 1 capsule by mouth in the morning and at  bedtime.      hydrOXYzine (ATARAX) 25 MG tablet Take 1 tablet (25 mg total) by mouth 3 (three) times daily as needed for anxiety. 30 tablet 0    Multiple Vitamins-Minerals (ADULT GUMMY PO) Take 1 tablet by mouth daily.      neomycin-bacitracin-polymyxin (NEOSPORIN) [phone] ointment Apply 1 Application topically daily as needed (Apply to affected area).      OLANZapine zydis (ZYPREXA) 5 MG disintegrating tablet Take 1 tablet (5 mg total) by mouth at bedtime.      traZODone (DESYREL) 50 MG tablet Take 1 tablet (50 mg total) by mouth at bedtime as needed for sleep.       Patient Stressors: Other: psychosis    Patient Strengths: Average or above average intelligence  Communication skills  Motivation for treatment/growth  Physical Health  Religious Affiliation  Supportive family/friends   Treatment Modalities: Medication Management, Group therapy, Case management,  1 to 1 session with clinician, Psychoeducation, Recreational therapy.   Physician Treatment Plan for Primary Diagnosis: Schizophrenia spectrum disorder with psychotic disorder type not yet determined (HCC) Long Term Goal(s): Improvement in symptoms so as ready for discharge   Short Term Goals: Ability to identify changes in lifestyle to reduce recurrence of condition will improve Ability to  verbalize feelings will improve Ability to disclose and discuss suicidal ideas Ability to demonstrate self-control will improve  Medication Management: Evaluate patient's response, side effects, and tolerance of medication regimen.  Therapeutic Interventions: 1 to 1 sessions, Unit Group sessions and Medication administration.  Evaluation of Outcomes: Progressing  Physician Treatment Plan for Secondary Diagnosis: Principal Problem:   Schizophrenia spectrum disorder with psychotic disorder type not yet determined (HCC)  Long Term Goal(s): Improvement in symptoms so as ready for discharge   Short Term Goals: Ability to identify  changes in lifestyle to reduce recurrence of condition will improve Ability to verbalize feelings will improve Ability to disclose and discuss suicidal ideas Ability to demonstrate self-control will improve     Medication Management: Evaluate patient's response, side effects, and tolerance of medication regimen.  Therapeutic Interventions: 1 to 1 sessions, Unit Group sessions and Medication administration.  Evaluation of Outcomes: Progressing   RN Treatment Plan for Primary Diagnosis: Schizophrenia spectrum disorder with psychotic disorder type not yet determined (HCC) Long Term Goal(s): Knowledge of disease and therapeutic regimen to maintain health will improve  Short Term Goals: Ability to remain free from injury will improve, Ability to verbalize frustration and anger appropriately will improve, Ability to demonstrate self-control, Ability to participate in decision making will improve, Ability to verbalize feelings will improve, Ability to disclose and discuss suicidal ideas, Ability to identify and develop effective coping behaviors will improve, and Compliance with prescribed medications will improve  Medication Management: RN will administer medications as ordered by provider, will assess and evaluate patient's response and provide education to patient for prescribed medication. RN will report any adverse and/or side effects to prescribing provider.  Therapeutic Interventions: 1 on 1 counseling sessions, Psychoeducation, Medication administration, Evaluate responses to treatment, Monitor vital signs and CBGs as ordered, Perform/monitor CIWA, COWS, AIMS and Fall Risk screenings as ordered, Perform wound care treatments as ordered.  Evaluation of Outcomes: Progressing   LCSW Treatment Plan for Primary Diagnosis: Schizophrenia spectrum disorder with psychotic disorder type not yet determined (HCC) Long Term Goal(s): Safe transition to appropriate next level of care at discharge, Engage  patient in therapeutic group addressing interpersonal concerns.  Short Term Goals: Engage patient in aftercare planning with referrals and resources, Increase social support, Increase ability to appropriately verbalize feelings, Increase emotional regulation, Facilitate acceptance of mental health diagnosis and concerns, Facilitate patient progression through stages of change regarding substance use diagnoses and concerns, Identify triggers associated with mental health/substance abuse issues, and Increase skills for wellness and recovery  Therapeutic Interventions: Assess for all discharge needs, 1 to 1 time with Social worker, Explore available resources and support systems, Assess for adequacy in community support network, Educate family and significant other(s) on suicide prevention, Complete Psychosocial Assessment, Interpersonal group therapy.  Evaluation of Outcomes: Progressing   Progress in Treatment: Attending groups: Yes. Participating in groups: Yes. Taking medication as prescribed: Yes. Toleration medication: Yes. Family/Significant other contact made: Yes, individual(s) contacted:  mother and father Patient understands diagnosis: No. Discussing patient identified problems/goals with staff: Yes. Medical problems stabilized or resolved: Yes. Denies suicidal/homicidal ideation: Yes. Issues/concerns per patient self-inventory: No.   New problem(s) identified: No, Describe:  none reported  New Short Term/Long Term Goal(s):   medication stabilization, elimination of SI thoughts, development of comprehensive mental wellness plan.    Patient Goals:  Pt continues to work on goals from initial treatment team.    Discharge Plan or Barriers: No psychosocial barriers at this time. Patient has been connected with med  management and therapy upon discharge.   Reason for Continuation of Hospitalization: Delusions  Hallucinations Medication stabilization  Estimated Length of Stay:  3-7 days  Last 3 [place] Suicide Severity Risk Score: [facility] Admission (Current) from 06/24/2022 in [facility] ED from 06/23/2022 in [facility] No Risk No Risk       Last PHQ 2/9 Scores:    06/23/2022    9:44 AM  Depression screen PHQ 2/9  Decreased Interest 0  Down, Depressed, Hopeless 0  PHQ - 2 Score 0    Scribe for Treatment Team: Zachery Conch, LCSW 07/05/2022 1:20 PM

## 2022-07-05 NOTE — Progress Notes (Signed)
[facility] MD Progress Note  07/05/2022 8:21 AM Joseph Wilson  MRN:  [id number]  Chief Complaint: psychosis  Total Time Spent in Direct Patient Care:  I personally spent 45 minutes on the unit in direct patient care. The direct patient care time included face-to-face time with the patient, reviewing the patient's chart, communicating with other professionals, and coordinating care. Greater than 50% of this time was spent in counseling or coordinating care with the patient regarding goals of hospitalization, psycho-education, and discharge planning needs.  Reason for Admission:  Joseph Wilson is a 21 y.o., male with no past psychiatric history who presents to the [facility] from [facility] for evaluation and management of worsening psychosis and paranoia. According to outside records, the patient presented to the behavioral health unit for [place] via [place] police voluntarily as a walk-in with complaint paranoia, auditory and visual hallucinations.The patient is currently on Hospital Day 11.   Chart Review from last 24 hours:  The patient's chart was reviewed and nursing notes were reviewed. The patient's case was discussed in multidisciplinary team meeting. Per nursing he attended evening wrap up group and had no acute behavioral issues or safety concerns noted. He remained paranoid and cautious on the unit. Per [facility] he was compliant with scheduled medications and did not require PRNs.  Information Obtained Today During Patient Interview: The patient was seen and evaluated on the unit. He is resting in bed on entrance to room and was not attending group. He states he still is concerned that scientists want to castrate him "out of revenge or Satanism." He denies paranoia on the unit and states his residual AH are much improved. The residual voices are occasinoally multiple and are unrecognizable. He did hear the voices tell him to "shut up" while talking to  examiner this monring. He states he still sees flashes of shadows in his peripheral vision, still has belief in telepathy, but no longer believes Edmon Crape can hear his thoughts. He now wonders if a rapper can hear his thoughts. He discussed belief that he is undergoing "spiritual warfare" and believes God is trying to talk to him but he cannot understand the messages. He denies SI or HI. He continues to endorse belief in thought insertion/withdrawal by "false apostles" who want to tempt him away from Christianity. He reports good sleep last night but states he feels somewhat sedated this morning. He is showering and eating well. He voices no physical complaints today. We discussed his abnormal ceruloplasmin and need for additional lab and 24 hour urine copper.   Call to father: I updated him on medication options including transition to potential 3rd atypical antipsychotic trial as monotherapy vs transition to a first generation antipsychotic as monotherapy and father states he is concerned about potential need for a first generation med. I discussed that if he remains refractory, he may require 2 antipsychotics, addition of a mood stabilizer, or Clozaril trial. I advised that he will likely need 2 antipsychotics during admission with eventual goal of tapering to monotherapy over time given severity of residual symptoms. He was made aware that the patient is not ready for discharge and he agrees that the patient is improving but still having trouble reality testing. We discussed his abnormal ceruloplasmin and need for additional testing for possible Wilson's disease. Time was given for questions.  Principal Problem: Schizophrenia spectrum disorder with psychotic disorder type not yet determined ([place]) Diagnosis: Principal Problem:   Schizophrenia spectrum disorder with psychotic disorder  type not yet determined [facility])  Past Psychiatric History: see H&P  Past Medical History:  Past Medical History:   Diagnosis Date   Acid reflux     Family Psychiatric  History: Psychiatric illness: None reported Suicide: None reported Substance Abuse: None reported  Social History: see H&P  Current Medications: Current Facility-Administered Medications  Medication Dose Route Frequency Provider Last Rate Last Admin   acetaminophen (TYLENOL) tablet 650 mg  650 mg Oral Q6H PRN Rankin, Shuvon B, NP   650 mg at 07/02/22 2052   alum & mag hydroxide-simeth (MAALOX/MYLANTA) 200-200-20 MG/5ML suspension 30 mL  30 mL Oral Q4H PRN Rankin, Shuvon B, NP       feeding supplement (ENSURE ENLIVE / ENSURE PLUS) liquid 237 mL  237 mL Oral BID BM Massengill, Ovid Curd, MD   237 mL at 07/04/22 1750   hydrOXYzine (ATARAX) tablet 25 mg  25 mg Oral TID PRN Dian Situ, MD   25 mg at 07/01/22 2045   loratadine (CLARITIN) tablet 10 mg  10 mg Oral Daily Attiah, Nadir, MD   10 mg at 07/05/22 0759   OLANZapine zydis (ZYPREXA) disintegrating tablet 5 mg  5 mg Oral Q8H PRN Harlow Asa, MD       And   LORazepam (ATIVAN) tablet 1 mg  1 mg Oral PRN Harlow Asa, MD       And   ziprasidone (GEODON) injection 20 mg  20 mg Intramuscular PRN Nelda Marseille, Tylar Merendino E, MD       magnesium hydroxide (MILK OF MAGNESIA) suspension 30 mL  30 mL Oral Daily PRN Rankin, Shuvon B, NP       OLANZapine zydis (ZYPREXA) disintegrating tablet 10 mg  10 mg Oral Daily Nelda Marseille, Delaynee Alred E, MD   10 mg at 07/05/22 0759   OLANZapine zydis (ZYPREXA) disintegrating tablet 20 mg  20 mg Oral QHS Nelda Marseille, Tyjay Galindo E, MD   20 mg at 07/04/22 2057   traZODone (DESYREL) tablet 100 mg  100 mg Oral QHS PRN Harlow Asa, MD        Lab Results:  Results for orders placed or performed during the hospital encounter of 06/24/22 (from the past 48 hour(s))  SARS Coronavirus 2 by RT PCR (hospital order, performed in [facility] hospital lab) *cepheid single result test* Anterior Nasal Swab     Status: None   Collection Time: 07/04/22 12:41 PM   Specimen: Anterior Nasal  Swab  Result Value Ref Range   SARS Coronavirus 2 by RT PCR NEGATIVE NEGATIVE    Comment: (NOTE) SARS-CoV-2 target nucleic acids are NOT DETECTED.  The SARS-CoV-2 RNA is generally detectable in upper and lower respiratory specimens during the acute phase of infection. The lowest concentration of SARS-CoV-2 viral copies this assay can detect is 250 copies / mL. A negative result does not preclude SARS-CoV-2 infection and should not be used as the sole basis for treatment or other patient management decisions.  A negative result may occur with improper specimen collection / handling, submission of specimen other than nasopharyngeal swab, presence of viral mutation(s) within the areas targeted by this assay, and inadequate number of viral copies (<250 copies / mL). A negative result must be combined with clinical observations, patient history, and epidemiological information.  Fact Sheet for Patients:   https://www.patel.info/  Fact Sheet for Healthcare Providers: https://hall.com/  This test is not yet approved or  cleared by the [place] FDA and has been authorized for detection and/or diagnosis of SARS-CoV-2 by  FDA under an Emergency Use Authorization (EUA).  This EUA will remain in effect (meaning this test can be used) for the duration of the COVID-19 declaration under Section 564(b)(1) of the Act, 21 U.S.C. section 360bbb-3(b)(1), unless the authorization is terminated or revoked sooner.  Performed at [facility], [place] [address] [place], [place] [address]      Blood Alcohol level:  Lab Results  Component Value Date   ETH <10 17/71/1657    Metabolic Disorder Labs: Lab Results  Component Value Date   HGBA1C 5.1 06/23/2022   MPG 99.67 06/23/2022   No results found for: "PROLACTIN" Lab Results  Component Value Date   CHOL 164 06/23/2022   TRIG 32 06/23/2022   HDL 63 06/23/2022   CHOLHDL 2.6  06/23/2022   VLDL 6 06/23/2022   LDLCALC 95 06/23/2022    Physical Findings: AIMS: Facial and Oral Movements Muscles of Facial Expression: None, normal Lips and Perioral Area: None, normal Jaw: None, normal Tongue: None, normal,Extremity Movements Upper (arms, wrists, hands, fingers): None, normal Lower (legs, knees, ankles, toes): None, normal, Trunk Movements Neck, shoulders, hips: None, normal, Overall Severity Severity of abnormal movements (highest score from questions above): None, normal Incapacitation due to abnormal movements: None, normal Patient's awareness of abnormal movements (rate only patient's report): No Awareness, Dental Status Current problems with teeth and/or dentures?: No Does patient usually wear dentures?: No   Musculoskeletal: Strength & Muscle Tone: within normal limits Gait & Station: normal Patient leans: N/A  Psychiatric Specialty Exam: Physical Exam Vitals and nursing note reviewed.  Constitutional:      Appearance: Normal appearance.  HENT:     Head: Normocephalic.  Pulmonary:     Effort: Pulmonary effort is normal.  Neurological:     General: No focal deficit present.     Mental Status: He is alert.     Review of Systems  Respiratory:  Negative for shortness of breath.   Cardiovascular:  Negative for chest pain.  Gastrointestinal:  Negative for constipation, diarrhea, nausea and vomiting.  Genitourinary:  Negative for difficulty urinating.  Neurological:  Negative for headaches.    Blood pressure 129/73, pulse 92, temperature 97.9 F (36.6 C), temperature source Oral, resp. rate 16, height '6\' 3"'  (1.905 m), weight 81.6 kg, SpO2 100 %.Body mass index is 22.5 kg/m.  General Appearance: Fairly Groomed and casually dressed  Eye Contact:  Fair  Speech: clear and coherent, normal fluency - non-spontaneous but answers direct questions with short responses  Volume:  Decreased  Mood:  described as "good" - appears calm but aloof  Affect:   Constricted and guarded  Thought Process:  Linear but concrete with less thought blocking noted  Orientation:  to self, year, month and city and improved insight into reason for admission  Thought Content:  Endorses belief in thought insertion/withdrawal, telepathy, belief a rapper can hear his thoughts, and has more hyper-religious themes on exam today; reports residual AVH and appears guarded- is not grossly responding to internal/external stimuli and has improvement in thought blocking on exam  Suicidal Thoughts:  No  Homicidal Thoughts:  No  Memory:   limited secondary to psychosis  Judgement:  Fair - compliant with meds and labs  Insight:  Shallow  Psychomotor Activity:  Normal, no akathisias or tremors noted  Concentration:  Concentration: Fair and Attention Span: Fair  Recall:   Limited secondary to psychosis  Fund of Knowledge:  Fair  Language:  Fair  Akathisia:  Negative  AIMS (if  indicated):   0  Assets:  Resilience Social Support  ADL's:  Intact  Sleep: 6.25 hours   Treatment Plan Summary: Diagnoses / Active Problems: Unspecified schizophrenia spectrum and other psychotic d/o (r/o schizophreniform d/o, r/o schizophrenia, r/o psychosis secondary to general medical condition)  PLAN: Safety and Monitoring:             -- Voluntary admission to inpatient psychiatric unit for safety, stabilization and treatment             -- Daily contact with patient to assess and evaluate symptoms and progress in treatment             -- Patient's case to be discussed in multi-disciplinary team meeting             -- Observation Level : q15 minute checks             -- Vital signs:  q12 hours             -- Precautions: suicide, elopement, and assault   2. Psychiatric Diagnoses and Treatment:  Unspecified schizophrenia spectrum and other psychotic d/o   -- Continue Zyprexa Zydis 32m qam and 226mqhs (above FDA recommended dosing but has had some improvement at therapeutic dose and  continues to have residual psychosis) - if no continued improvement after tonight, will need addition of either Risperdal or Haldol (Lipid panel WNL, A1c 5.1, QTc 37058mnd on repeat 8/2 393m63m            -- Continue Vistaril 25mg19m as needed for anxiety            -- Continue trazodone 100mg 15might as needed for sleep   -- New onset psychosis w/u (Head CT noncontrast negative on 7/24; folate 13.5, B12 578, UDS negative, TSH 2.775, ETOH <10, Mag 2.0, CMP WNL other than anion gap 4, repeat CBC WNL, ESR 0, HIV nonreactive, RPR nonreactive, heavy metal WNL, ANA negative, Ceruloplasmin low at 13.8)  B1 pending               3.Medical Issues Being Addressed:   Leukopenia - resolved  -- Repeat WBC 5.4 and ANC 1800 on 8/1 - continue to monitor while on atypical antipsychotic  - repeating CBC tonight for trending with dose increase in Zyprexa   Low Ceruloplasmin   -- Checking 24 hour urine copper and serum copper  4. Discharge Planning:   -- Social work and case management to assist with discharge planning and identification of hospital follow-up needs prior to discharge  -- Discharge Concerns: Need to establish a safety plan; Medication compliance and effectiveness  -- Discharge Goals: Return home with outpatient referrals for mental health follow-up including medication management/psychotherapy  Javarus Dorner E Harlow AsaFAPA 07/05/2022, 8:21 AM

## 2022-07-05 NOTE — BHH Group Notes (Signed)
Adult Psychoeducational Group Note  Date:  07/05/2022 Time:  2:20 PM  Group Topic/Focus:  Wellness Toolbox:   The focus of this group is to discuss various aspects of wellness, balancing those aspects and exploring ways to increase the ability to experience wellness.  Patients will create a wellness toolbox for use upon discharge.  Participation Level:  Active  Participation Quality:  Attentive  Affect:  Appropriate  Cognitive:  Alert  Insight: Appropriate  Engagement in Group:  Engaged  Modes of Intervention:  Activity  Additional Comments:  Patient attended and participated in the relaxation group activity.  Jearl Klinefelter 07/05/2022, 2:20 PM

## 2022-07-05 NOTE — Progress Notes (Signed)
   07/05/22 0900  Psych Admission Type (Psych Patients Only)  Admission Status Voluntary  Psychosocial Assessment  Patient Complaints Suspiciousness  Eye Contact Glaring  Facial Expression Flat;Sad  Affect Preoccupied  Speech Soft  Interaction Cautious;Forwards little;Minimal  Motor Activity Slow  Appearance/Hygiene Unremarkable  Behavior Characteristics Cooperative;Calm  Mood Preoccupied;Suspicious  Aggressive Behavior  Effect No apparent injury  Thought Process  Coherency Blocking  Content Preoccupation  Delusions Paranoid  Perception Hallucinations  Hallucination Auditory;Visual  Judgment Impaired  Confusion None  Danger to Self  Current suicidal ideation? Denies  Danger to Others  Danger to Others None reported or observed  Danger to Others Abnormal  Harmful Behavior to others No threats or harm toward other people

## 2022-07-06 LAB — CBC WITH DIFFERENTIAL/PLATELET
Abs Immature Granulocytes: 0.01 10*3/uL (ref 0.00–0.07)
Basophils Absolute: 0 10*3/uL (ref 0.0–0.1)
Basophils Relative: 1 %
Eosinophils Absolute: 0.1 10*3/uL (ref 0.0–0.5)
Eosinophils Relative: 1 %
HCT: 46.2 % (ref 39.0–52.0)
Hemoglobin: 15.8 g/dL (ref 13.0–17.0)
Immature Granulocytes: 0 %
Lymphocytes Relative: 59 %
Lymphs Abs: 3.5 10*3/uL (ref 0.7–4.0)
MCH: 32.1 pg (ref 26.0–34.0)
MCHC: 34.2 g/dL (ref 30.0–36.0)
MCV: 93.9 fL (ref 80.0–100.0)
Monocytes Absolute: 0.6 10*3/uL (ref 0.1–1.0)
Monocytes Relative: 10 %
Neutro Abs: 1.8 10*3/uL (ref 1.7–7.7)
Neutrophils Relative %: 29 %
Platelets: 215 10*3/uL (ref 150–400)
RBC: 4.92 MIL/uL (ref 4.22–5.81)
RDW: 12.2 % (ref 11.5–15.5)
WBC: 6 10*3/uL (ref 4.0–10.5)
nRBC: 0 % (ref 0.0–0.2)

## 2022-07-06 MED ORDER — RISPERIDONE 1 MG PO TBDP
1.0000 mg | ORAL_TABLET | Freq: Two times a day (BID) | ORAL | Status: DC
  Administered 2022-07-06 – 2022-07-07 (×2): 1 mg via ORAL
  Filled 2022-07-06 (×8): qty 1

## 2022-07-06 MED ORDER — OLANZAPINE 10 MG PO TBDP: 10.0000 mg | ORAL_TABLET | Freq: Every day | ORAL | Status: DC

## 2022-07-06 MED ORDER — RISPERIDONE 1 MG PO TABS
1.0000 mg | ORAL_TABLET | Freq: Two times a day (BID) | ORAL | Status: DC
  Filled 2022-07-06 (×2): qty 1

## 2022-07-06 MED ORDER — OLANZAPINE 10 MG PO TBDP
20.0000 mg | ORAL_TABLET | Freq: Every day | ORAL | Status: DC
  Administered 2022-07-07: 20 mg via ORAL
  Filled 2022-07-06 (×3): qty 2

## 2022-07-06 NOTE — Group Note (Signed)
  [facility] LCSW Group Therapy Note  Date/Time:  07/06/2022 10:00am-11:00am  Type of Therapy and Topic:  Group Therapy:  Stability after Discharge  Participation Level:  Minimal   Description of Group This process group involved patients discussing what their overall goal is at this time in their life and how they plan to work toward that goal when they get home from the hospital.  The group started with patients sharing their goal and plans, then proceeded with group members identifying with each other.  A discussion ensued about the differences in healthy and unhealthy coping skills and a variety of specific coping skills that might be helpful in specific instances were shared.  Group members shared ideas about making changes when they return home so that they can stay well and in recovery.  These included boundaries, putting oneself first, staying on medications, talking to a therapist, and more  Therapeutic Goals Patient will identify one overall goal Patient will list current ideas for how to go about achieving their goal Patient will participate in generating ideas about healthy self-care options when they return to the community Patient will be supportive of one another and receive support from others Patient will receive affirmations and hope  Summary of Patient Progress:  The patient expressed his goal is "to have 2 things to allow something to be in a strong position."  The patient also compared stability to the supports under a bridge.  He was mostly quiet during group.   Therapeutic Modalities Brief Solution-Focused Therapy Psychoeducation   Ambrose Mantle, LCSW 07/06/2022, 12:00pm

## 2022-07-06 NOTE — Progress Notes (Signed)
Writer s[poke with patient 1:1 and he reports having had a good day. When writer asked if he was having auditory or visual hallucinations he reported that he has a internal struggle with demons and going good. He reported feeling that something was going to castrate him and pointed to the floor as if something was on the floor. Writer asked if he felt the urge to do this and he replied no is the demons. Writer asked that he notify any staff member if these thoughts become stronger. Support given and safety maintained with 15 min checks.

## 2022-07-06 NOTE — Progress Notes (Addendum)
[facility] MD Progress Note  07/06/2022 1:13 PM Joseph Wilson  MRN:  [id number]  Chief Complaint: psychosis  Reason for Admission:  Joseph Wilson is a 21 y.o., male with no past psychiatric history who presents to the [facility] from [facility] for evaluation and management of worsening psychosis and paranoia. According to outside records, the patient presented to the behavioral health unit for [place] via [place] police voluntarily as a walk-in with complaint paranoia, auditory and visual hallucinations.The patient is currently on Hospital Day 12.   Chart Review from last 24 hours:  The patient's chart was reviewed and nursing notes were reviewed. The patient's case was discussed in multidisciplinary team meeting. Per nursing pt has attended some group sessions over the past 24 hrs, and is compliant with his scheduled medications. He has not required any agitation protocol medications or anti anxiety medications over the past 24 hrs. He has remained psychotic over the past 24 hrs as per nursing documentation.   Information Obtained Today During Patient Interview: The patient was seen and evaluated on the unit. He was in the day room watching tv with his peers when Probation officer approached him and requested that he accompanies Probation officer to his room for assessment. He was hesitant, but complied. Today, pt presents with a flat affect and depressed mood, attention to personal hygiene and grooming is fair, eye contact is fair, speech is clear & coherent. Thought are organized, but with some illogical contents.  Pt currently denies SI/HI. He present with +AVH, paranoia & delusional thoughts.    Pt reports that he hears voices saying random things, and that a voice said to him "You don't want to reprocate to the point of disgust", while writer was talking. Pt asked what this means, but ruminates about praying to God 5-6 months ago and feeling like his prayers were not being  answered, and going on a fast whereby the devil starting attacking his family, then talks about the spiritual world. Pt seemed to be thought blocking at times, and stated that his thoughts were racing and he was having a difficult time putting what he was thinking into words.  Pt reports that he sees "[employer]" which are "people who leave the body and travel spiritually. He reports seeing one of these during this assessment. He reports thinking that, people are watching him at times, and states that it last happened earlier in the morning when he was eating breakfast. He reports that other people know what he is thinking, and that Will Tamala Julian knows what he is thinking. He states that people on tv and social media can also hear him when he is watching tv, or looking at social media, and that they are able to transmit "content" to him.  Pt is agreeable to the addition of Risperdal to his medication regimen, and the reduction of Zyprexa from 30 mg nightly to 20 mg nightly, since he has not whown any improvement on this medication. We will start Risperdal at 1 mg BID for management of psychosis. Attending psychiatrist also educated pt's father on the need for medication adjustment as above to target pt's psychosis. Pt has been educated on the rationales, benefits and possible side effects of Risperdal and is agreeable to this trial.  No TD/EPS type symptoms found on assessment, and pt denies any feelings of stiffness. AIMS: 0.   Principal Problem: Schizophrenia spectrum disorder with psychotic disorder type not yet determined ([place]) Diagnosis: Principal Problem:   Schizophrenia spectrum  disorder with psychotic disorder type not yet determined [facility])  Past Psychiatric History: see H&P  Past Medical History:  Past Medical History:  Diagnosis Date   Acid reflux    Family Psychiatric  History: Psychiatric illness: None reported Suicide: None reported Substance Abuse: None reported  Social  History: see H&P  Current Medications: Current Facility-Administered Medications  Medication Dose Route Frequency Provider Last Rate Last Admin   acetaminophen (TYLENOL) tablet 650 mg  650 mg Oral Q6H PRN Rankin, Shuvon B, NP   650 mg at 07/02/22 2052   alum & mag hydroxide-simeth (MAALOX/MYLANTA) 200-200-20 MG/5ML suspension 30 mL  30 mL Oral Q4H PRN Rankin, Shuvon B, NP       benztropine (COGENTIN) tablet 1 mg  1 mg Oral BID PRN Harlow Asa, MD       feeding supplement (ENSURE ENLIVE / ENSURE PLUS) liquid 237 mL  237 mL Oral BID BM Massengill, Ovid Curd, MD   237 mL at 07/06/22 0942   hydrOXYzine (ATARAX) tablet 25 mg  25 mg Oral TID PRN Dian Situ, MD   25 mg at 07/01/22 2045   loratadine (CLARITIN) tablet 10 mg  10 mg Oral Daily Winfred Leeds, Nadir, MD   10 mg at 07/06/22 0758   OLANZapine zydis (ZYPREXA) disintegrating tablet 5 mg  5 mg Oral Q8H PRN Harlow Asa, MD       And   LORazepam (ATIVAN) tablet 1 mg  1 mg Oral PRN Harlow Asa, MD       And   ziprasidone (GEODON) injection 20 mg  20 mg Intramuscular PRN Nelda Marseille, Chieko Neises E, MD       magnesium hydroxide (MILK OF MAGNESIA) suspension 30 mL  30 mL Oral Daily PRN Rankin, Shuvon B, NP       [START ON 07/07/2022] OLANZapine zydis (ZYPREXA) disintegrating tablet 10 mg  10 mg Oral QHS Nkwenti, Doris, NP       risperiDONE (RISPERDAL) tablet 1 mg  1 mg Oral BID Nicholes Rough, NP       traZODone (DESYREL) tablet 100 mg  100 mg Oral QHS PRN Harlow Asa, MD        Lab Results:  Results for orders placed or performed during the hospital encounter of 06/24/22 (from the past 48 hour(s))  CBC with Differential/Platelet     Status: None   Collection Time: 07/06/22  6:35 AM  Result Value Ref Range   WBC 6.0 4.0 - 10.5 K/uL   RBC 4.92 4.22 - 5.81 MIL/uL   Hemoglobin 15.8 13.0 - 17.0 g/dL   HCT 46.2 39.0 - 52.0 %   MCV 93.9 80.0 - 100.0 fL   MCH 32.1 26.0 - 34.0 pg   MCHC 34.2 30.0 - 36.0 g/dL   RDW 12.2 11.5 - 15.5 %   Platelets  215 150 - 400 K/uL   nRBC 0.0 0.0 - 0.2 %   Neutrophils Relative % 29 %   Neutro Abs 1.8 1.7 - 7.7 K/uL   Lymphocytes Relative 59 %   Lymphs Abs 3.5 0.7 - 4.0 K/uL   Monocytes Relative 10 %   Monocytes Absolute 0.6 0.1 - 1.0 K/uL   Eosinophils Relative 1 %   Eosinophils Absolute 0.1 0.0 - 0.5 K/uL   Basophils Relative 1 %   Basophils Absolute 0.0 0.0 - 0.1 K/uL   Immature Granulocytes 0 %   Abs Immature Granulocytes 0.01 0.00 - 0.07 K/uL    Comment: Performed at [facility],  [place] [address], [place], [place] [address]     Blood Alcohol level:  Lab Results  Component Value Date   ETH <10 72/53/6644   Metabolic Disorder Labs: Lab Results  Component Value Date   HGBA1C 5.1 06/23/2022   MPG 99.67 06/23/2022   No results found for: "PROLACTIN" Lab Results  Component Value Date   CHOL 164 06/23/2022   TRIG 32 06/23/2022   HDL 63 06/23/2022   CHOLHDL 2.6 06/23/2022   VLDL 6 06/23/2022   LDLCALC 95 06/23/2022   Physical Findings: AIMS: Facial and Oral Movements Muscles of Facial Expression: None, normal Lips and Perioral Area: None, normal Jaw: None, normal Tongue: None, normal,Extremity Movements Upper (arms, wrists, hands, fingers): None, normal Lower (legs, knees, ankles, toes): None, normal, Trunk Movements Neck, shoulders, hips: None, normal, Overall Severity Severity of abnormal movements (highest score from questions above): None, normal Incapacitation due to abnormal movements: None, normal Patient's awareness of abnormal movements (rate only patient's report): No Awareness, Dental Status Current problems with teeth and/or dentures?: No Does patient usually wear dentures?: No   Musculoskeletal: Strength & Muscle Tone: within normal limits Gait & Station: normal Patient leans: N/A  Psychiatric Specialty Exam: Physical Exam Vitals and nursing note reviewed.  Constitutional:      Appearance: Normal appearance.  HENT:     Head:  Normocephalic.  Pulmonary:     Effort: Pulmonary effort is normal.  Neurological:     General: No focal deficit present.     Mental Status: He is alert.     Review of Systems  Respiratory:  Negative for shortness of breath.   Cardiovascular:  Negative for chest pain.  Gastrointestinal:  Negative for constipation, diarrhea, nausea and vomiting.  Genitourinary:  Negative for difficulty urinating.  Neurological:  Negative for headaches.  Psychiatric/Behavioral:  Positive for hallucinations. Negative for agitation, behavioral problems, confusion, decreased concentration, dysphoric mood and self-injury. The patient is not nervous/anxious.     Blood pressure 118/80, pulse (!) 110, temperature 97.8 F (36.6 C), temperature source Oral, resp. rate 16, height _0  (1.905 m), weight 81.6 kg, SpO2 97 %.Body mass index is 22.5 kg/m.  General Appearance: Fairly Groomed and casually dressed  Eye Contact:  Fair  Speech: clear and coherent, normal fluency - non-spontaneous but answers direct questions with short responses  Volume:  Decreased  Mood:  described as "good" - appears calm but aloof  Affect:  Constricted and guarded  Thought Process:  Linear but concrete with less thought blocking noted  Orientation:  to self, year, month and city and improved insight into reason for admission  Thought Content:  Endorses belief in thought insertion/withdrawal, telepathy, belief a an actor/singer can hear his thoughts, and has more hyper-religious themes on exam today; reports residual AVH and appears guarded- is not grossly responding to internal/external stimuli and has improvement in thought blocking on exam  Suicidal Thoughts:  No  Homicidal Thoughts:  No  Memory:   limited secondary to psychosis  Judgement:  Fair - compliant with meds and labs  Insight:  Shallow  Psychomotor Activity:  Normal, no akathisias or tremors noted  Concentration:  Concentration: Fair and Attention Span: Fair  Recall:    Limited secondary to psychosis  Fund of Knowledge:  Fair  Language:  Fair  Akathisia:  Negative  AIMS (if indicated):   0  Assets:  Resilience Social Support  ADL's:  Intact  Sleep: total time unrecorded   Treatment Plan Summary: Diagnoses / Active Problems: Unspecified  schizophrenia spectrum and other psychotic d/o (r/o schizophreniform d/o, r/o schizophrenia, r/o psychosis secondary to general medical condition)  PLAN: Safety and Monitoring:             -- Voluntary admission to inpatient psychiatric unit for safety, stabilization and treatment             -- Daily contact with patient to assess and evaluate symptoms and progress in treatment             -- Patient's case to be discussed in multi-disciplinary team meeting             -- Observation Level : q15 minute checks             -- Vital signs:  q12 hours             -- Precautions: suicide, elopement, and assault   2. Psychiatric Diagnoses and Treatment:  Unspecified schizophrenia spectrum and other psychotic d/o  -Start Risperdal M tabs 1 mg BID for psychosis - goal of cross tapering onto monotherapy if tolerated - consider LAI if responds to this medication - Decrease Zyprexa Zydis to 63m qhs (Pt has not shown any improvement on Zyprexa 10 mg in the mornings and 20 mg at night) (Lipid panel WNL, A1c 5.1, QTc 3764mand on repeat 8/2 39369m             -- Continue Vistaril 18m17md as needed for anxiety            -- Continue trazodone 100mg35mnight as needed for sleep   -- New onset psychosis w/u (Head CT noncontrast negative on 7/24; folate 13.5, B12 578, UDS negative, TSH 2.775, ETOH <10, Mag 2.0, CMP WNL other than anion gap 4, repeat CBC WNL, ESR 0, HIV nonreactive, RPR nonreactive, heavy metal WNL, ANA negative, Ceruloplasmin low at 13.8)  B1 pending               3.Medical Issues Being Addressed:   Leukopenia - resolved  -- Repeat WBC 5.4 and ANC 1800 on 8/1 - continue to monitor while on atypical antipsychotic   - repeating CBC tonight for trending with dose increase in Zyprexa   Low Ceruloplasmin   -- Checking 24 hour urine copper and serum copper  4. Discharge Planning:   -- Social work and case management to assist with discharge planning and identification of hospital follow-up needs prior to discharge  -- Discharge Concerns: Need to establish a safety plan; Medication compliance and effectiveness  -- Discharge Goals: Return home with outpatient referrals for mental health follow-up including medication management/psychotherapy  DorisNicholes Rough FAPA 07/06/2022, 1:13 PM   Patient ID: Joseph Wilson   DOB: 11/19Jul 21, 2002y.27   MRN: [id number]

## 2022-07-06 NOTE — Progress Notes (Signed)
Adult Psychoeducational Group Note  Date:  07/06/2022 Time:  8:42 PM  Group Topic/Focus:  Wrap-Up Group:   The focus of this group is to help patients review their daily goal of treatment and discuss progress on daily workbooks.  Participation Level:  Did Not Attend  Participation Quality:  N/A  Affect:  N/A  Cognitive:  N/A  Insight: None  Engagement in Group:  N/A  Modes of Intervention:  N/A  Additional Comments:   Pt was encouraged to attend group discussion but refused.  [name] 07/06/2022, 8:42 PM

## 2022-07-06 NOTE — Progress Notes (Signed)
Per log, Pt slept for 6.25 hrs last night. Pt was observed looking down upon interaction. Pt avoided eye contact. Pt endorses VH of "Shadows" and AH of "Demons telling me to make bad decisions". Per Pt, voices are not command. Pt is asking about discharge. Pt denies SI/HI. UA Copper in progress. Pt remains safe.

## 2022-07-07 LAB — SARS CORONAVIRUS 2 BY RT PCR: SARS Coronavirus 2 by RT PCR: NEGATIVE

## 2022-07-07 MED ORDER — RISPERIDONE 1 MG PO TBDP
1.0000 mg | ORAL_TABLET | Freq: Every day | ORAL | Status: DC
  Administered 2022-07-08: 1 mg via ORAL
  Filled 2022-07-07 (×3): qty 1

## 2022-07-07 MED ORDER — FLUTICASONE PROPIONATE 50 MCG/ACT NA SUSP
1.0000 | Freq: Every day | NASAL | Status: DC | PRN
  Administered 2022-07-07 – 2022-07-25 (×3): 1 via NASAL
  Filled 2022-07-07: qty 16

## 2022-07-07 MED ORDER — RISPERIDONE 2 MG PO TBDP
2.0000 mg | ORAL_TABLET | Freq: Every day | ORAL | Status: DC
  Administered 2022-07-07: 2 mg via ORAL
  Filled 2022-07-07 (×3): qty 1

## 2022-07-07 NOTE — Progress Notes (Addendum)
[facility] MD Progress Note  07/07/2022 2:26 PM Jered EMMANUAL GAUTHREAUX  MRN:  [id number]  Chief Complaint: psychosis  Reason for Admission:  Joseph Wilson is a 21 y.o., male with no past psychiatric history who presents to the [facility] from [facility] for evaluation and management of worsening psychosis and paranoia. According to outside records, the patient presented to the behavioral health unit for [place] via [place] police voluntarily as a walk-in with complaint paranoia, auditory and visual hallucinations.The patient is currently on Hospital Day 13.   Chart Review from last 24 hours:  The patient's chart was reviewed and nursing notes were reviewed. The patient's case was discussed in multidisciplinary team meeting. Per nursing pt has not attended any group sessions over the past 24 hrs. He has been hesitant to take his medications, and there are concerns that he might "cheeking" his meds. He required Hydroxyzine 25 mg last night for anxiety, and has not required any agitation protocol medications over the past 24 hrs. As per nursing staff documentation, he has remained preoccupied, paranoid, disorganized and has had +AVH for the past 24 hrs.  Information Obtained Today During Patient Interview: The patient was seen and evaluated in his room on the 500 hall. Pt with flat affect and depressed mood, attention to personal hygiene and grooming is fair, eye contact is good, speech is clear & coherent. Thoughts are organized and contents continue to be illogical, and pt currently denies SI/HI, but continues to endorse +AVH and paranoia with delusional thoughts.    Pt states today that he is hearing voices which are "different". He states that it is just one person talking at a time, and what the person is saying is "more rationale than before". He states that the voice scares him "a little". He states that he sees black mists and clear mists and thinks that it is "a  spiritual warfare". He states that "the spirit of Kundalini" and "the spirit of divination" is causing him to get messages that are transmitted to him. He states that he will not urinate into the 24 hr urine jar for testing for Wilson's disease because a mist is coming out of the jar and entering his food. Positive reinforcements given for pt to urinate into the jar. He states that other people know what he is thinking about. He reports a good sleep quality last night and reports a good appetite.   Writer spoke with Demetrie's father and he states that he was clearer on the Abilify. Writer educated him that Risperdal is a more potent medication and will help with the psychosis better than Abilify. Writer also educated that the goal is that if he clears on the Risperdal, we will transition him to [place] prior to discharge. Father was educated on the need for him to provide positive reinforcements for Spence to urinate in the jar in his room for testing of Wilson's disease.  He stated he would, but is asking for attending psychiatrist call him to discuss putting him back on the Abilify. Attending psychiatrist to call pt's father tomorrow. We will increase Risperdal to 1 mg in the mornings and 2 mg at night. We will continue other medications as listed below.  No TD/EPS type symptoms found on assessment, and pt denies any feelings of stiffness. AIMS: 0.   Principal Problem: Schizophrenia spectrum disorder with psychotic disorder type not yet determined ([place]) Diagnosis: Principal Problem:   Schizophrenia spectrum disorder with psychotic disorder type not yet  determined [facility])  Past Psychiatric History: see H&P  Past Medical History:  Past Medical History:  Diagnosis Date   Acid reflux    Family Psychiatric  History: Psychiatric illness: None reported Suicide: None reported Substance Abuse: None reported  Social History: see H&P  Current Medications: Current Facility-Administered Medications   Medication Dose Route Frequency Provider Last Rate Last Admin   acetaminophen (TYLENOL) tablet 650 mg  650 mg Oral Q6H PRN Rankin, Shuvon B, NP   650 mg at 07/02/22 2052   alum & mag hydroxide-simeth (MAALOX/MYLANTA) 200-200-20 MG/5ML suspension 30 mL  30 mL Oral Q4H PRN Rankin, Shuvon B, NP       benztropine (COGENTIN) tablet 1 mg  1 mg Oral BID PRN Harlow Asa, MD       feeding supplement (ENSURE ENLIVE / ENSURE PLUS) liquid 237 mL  237 mL Oral BID BM Massengill, Ovid Curd, MD   237 mL at 07/06/22 1554   hydrOXYzine (ATARAX) tablet 25 mg  25 mg Oral TID PRN Dian Situ, MD   25 mg at 07/06/22 2011   loratadine (CLARITIN) tablet 10 mg  10 mg Oral Daily Attiah, Nadir, MD   10 mg at 07/07/22 0805   OLANZapine zydis (ZYPREXA) disintegrating tablet 5 mg  5 mg Oral Q8H PRN Harlow Asa, MD       And   LORazepam (ATIVAN) tablet 1 mg  1 mg Oral PRN Harlow Asa, MD       And   ziprasidone (GEODON) injection 20 mg  20 mg Intramuscular PRN Nelda Marseille, Kavya Haag E, MD       magnesium hydroxide (MILK OF MAGNESIA) suspension 30 mL  30 mL Oral Daily PRN Rankin, Shuvon B, NP       OLANZapine zydis (ZYPREXA) disintegrating tablet 20 mg  20 mg Oral QHS Harlow Asa, MD       [START ON 07/08/2022] risperiDONE (RISPERDAL M-TABS) disintegrating tablet 1 mg  1 mg Oral Daily Nkwenti, Doris, NP       risperiDONE (RISPERDAL M-TABS) disintegrating tablet 2 mg  2 mg Oral QHS Nkwenti, Doris, NP       traZODone (DESYREL) tablet 100 mg  100 mg Oral QHS PRN Harlow Asa, MD        Lab Results:  Results for orders placed or performed during the hospital encounter of 06/24/22 (from the past 48 hour(s))  CBC with Differential/Platelet     Status: None   Collection Time: 07/06/22  6:35 AM  Result Value Ref Range   WBC 6.0 4.0 - 10.5 K/uL   RBC 4.92 4.22 - 5.81 MIL/uL   Hemoglobin 15.8 13.0 - 17.0 g/dL   HCT 46.2 39.0 - 52.0 %   MCV 93.9 80.0 - 100.0 fL   MCH 32.1 26.0 - 34.0 pg   MCHC 34.2 30.0 - 36.0  g/dL   RDW 12.2 11.5 - 15.5 %   Platelets 215 150 - 400 K/uL   nRBC 0.0 0.0 - 0.2 %   Neutrophils Relative % 29 %   Neutro Abs 1.8 1.7 - 7.7 K/uL   Lymphocytes Relative 59 %   Lymphs Abs 3.5 0.7 - 4.0 K/uL   Monocytes Relative 10 %   Monocytes Absolute 0.6 0.1 - 1.0 K/uL   Eosinophils Relative 1 %   Eosinophils Absolute 0.1 0.0 - 0.5 K/uL   Basophils Relative 1 %   Basophils Absolute 0.0 0.0 - 0.1 K/uL   Immature Granulocytes 0 %   Abs  Immature Granulocytes 0.01 0.00 - 0.07 K/uL    Comment: Performed at [facility], [place] [address], [place], [place] [address]     Blood Alcohol level:  Lab Results  Component Value Date   ETH <10 01/60/1093   Metabolic Disorder Labs: Lab Results  Component Value Date   HGBA1C 5.1 06/23/2022   MPG 99.67 06/23/2022   No results found for: "PROLACTIN" Lab Results  Component Value Date   CHOL 164 06/23/2022   TRIG 32 06/23/2022   HDL 63 06/23/2022   CHOLHDL 2.6 06/23/2022   VLDL 6 06/23/2022   LDLCALC 95 06/23/2022   Physical Findings: AIMS: Facial and Oral Movements Muscles of Facial Expression: None, normal Lips and Perioral Area: None, normal Jaw: None, normal Tongue: None, normal,Extremity Movements Upper (arms, wrists, hands, fingers): None, normal Lower (legs, knees, ankles, toes): None, normal, Trunk Movements Neck, shoulders, hips: None, normal, Overall Severity Severity of abnormal movements (highest score from questions above): None, normal Incapacitation due to abnormal movements: None, normal Patient's awareness of abnormal movements (rate only patient's report): No Awareness, Dental Status Current problems with teeth and/or dentures?: No Does patient usually wear dentures?: No   Musculoskeletal: Strength & Muscle Tone: within normal limits Gait & Station: normal Patient leans: N/A  Psychiatric Specialty Exam: Physical Exam Vitals and nursing note reviewed.  Constitutional:      Appearance:  Normal appearance.  HENT:     Head: Normocephalic.  Pulmonary:     Effort: Pulmonary effort is normal.  Neurological:     General: No focal deficit present.     Mental Status: He is alert.     Review of Systems  Respiratory:  Negative for shortness of breath.   Cardiovascular:  Negative for chest pain.  Gastrointestinal:  Negative for constipation, diarrhea, nausea and vomiting.  Genitourinary:  Negative for difficulty urinating.  Neurological:  Negative for headaches.  Psychiatric/Behavioral:  Positive for hallucinations. Negative for agitation, behavioral problems, confusion, decreased concentration, dysphoric mood and self-injury. The patient is not nervous/anxious and is not hyperactive.     Blood pressure 117/66, pulse 93, temperature 98.2 F (36.8 C), temperature source Oral, resp. rate 16, height '6\' 3"'  (1.905 m), weight 81.6 kg, SpO2 97 %.Body mass index is 22.5 kg/m.  General Appearance: Fairly Groomed and casually dressed  Eye Contact:  Fair  Speech: clear and coherent, normal fluency - non-spontaneous but answers direct questions with short responses  Volume:  Decreased  Mood:  described as "good" - appears calm but aloof  Affect:  Constricted and guarded  Thought Process:  Linear but concrete with less thought blocking noted  Orientation:  to self, year, month and city and improved insight into reason for admission  Thought Content:  Continues to rndorses belief in thought insertion/withdrawal, telepathy.  Suicidal Thoughts:  No  Homicidal Thoughts:  No  Memory:   limited secondary to psychosis  Judgement:  Fair - compliant with meds and labs  Insight:  Shallow  Psychomotor Activity:  Normal, no akathisias or tremors noted  Concentration:  Concentration: Fair and Attention Span: Fair  Recall:   Limited secondary to psychosis  Fund of Knowledge:  Fair  Language:  Fair  Akathisia:  Negative  AIMS (if indicated):   0  Assets:  Resilience Social Support  ADL's:   Intact  Sleep: total time unrecorded   Treatment Plan Summary: Diagnoses / Active Problems: Unspecified schizophrenia spectrum and other psychotic d/o (r/o schizophreniform d/o, r/o schizophrenia, r/o psychosis secondary to  general medical condition)  PLAN: Safety and Monitoring:             -- Voluntary admission to inpatient psychiatric unit for safety, stabilization and treatment             -- Daily contact with patient to assess and evaluate symptoms and progress in treatment             -- Patient's case to be discussed in multi-disciplinary team meeting             -- Observation Level : q15 minute checks             -- Vital signs:  q12 hours             -- Precautions: suicide, elopement, and assault   2. Psychiatric Diagnoses and Treatment:  Unspecified schizophrenia spectrum and other psychotic d/o  -Increase Risperdal M tabs 1 mg in the mornings and 2 mg Q H S for psychosis - goal of cross tapering onto monotherapy if tolerated - consider LAI if responds to this medication - Continue Zyprexa Zydis 5m qhs (Pt has not shown any improvement on Zyprexa 10 mg in the mornings and 20 mg at night) (Lipid panel WNL, A1c 5.1, QTc 3711mand on repeat 8/2 39367mRepeating EKG for trending of QTC on 2 antipsychotics             -- Continue Vistaril 57m34md as needed for anxiety            -- Continue trazodone 100mg58mnight as needed for sleep   -- New onset psychosis w/u (Head CT noncontrast negative on 7/24; folate 13.5, B12 578, UDS negative, TSH 2.775, ETOH <10, Mag 2.0, CMP WNL other than anion gap 4, repeat CBC WNL, ESR 0, HIV nonreactive, RPR nonreactive, heavy metal WNL, ANA negative, Ceruloplasmin low at 13.8)  B1 pending               3.Medical Issues Being Addressed:   Leukopenia - resolved  -- Repeat WBC 5.4 and ANC 1800 on 8/1 - continue to monitor while on atypical antipsychotic  - repeating CBC tonight for trending with dose increase in Zyprexa   Low Ceruloplasmin    -- Checking 24 hour urine copper and serum copper -   4. Discharge Planning:   -- Social work and case management to assist with discharge planning and identification of hospital follow-up needs prior to discharge  -- Discharge Concerns: Need to establish a safety plan; Medication compliance and effectiveness  -- Discharge Goals: Return home with outpatient referrals for mental health follow-up including medication management/psychotherapy  DorisNicholes Rough8/05/2022, 2:26 PM   Patient ID: TyreiVeverly Fellse   DOB: [birth year]9   MRN: [id number] ID: TyreiQUINDON DENKERe   DOB: [birth year]44   MRN: [id number]

## 2022-07-07 NOTE — Progress Notes (Signed)
   07/07/22 1000  Psych Admission Type (Psych Patients Only)  Admission Status Voluntary  Psychosocial Assessment  Patient Complaints Anxiety  Eye Contact Brief;Avoids  Facial Expression Flat  Affect Anxious;Preoccupied  Speech Soft  Interaction Cautious;Forwards little;Guarded;Isolative  Motor Activity Slow  Appearance/Hygiene Unremarkable  Behavior Characteristics Cooperative  Mood Anxious;Preoccupied  Aggressive Behavior  Effect No apparent injury  Thought Process  Coherency Disorganized  Content Preoccupation;Religiosity  Delusions Paranoid;Religious  Perception Hallucinations  Hallucination Auditory;Visual  Judgment Impaired  Confusion None  Danger to Self  Current suicidal ideation? Denies  Danger to Others  Danger to Others None reported or observed  Danger to Others Abnormal  Harmful Behavior to others No threats or harm toward other people

## 2022-07-07 NOTE — [facility] Group Notes (Signed)
Pt. Did not attend golds group. 

## 2022-07-07 NOTE — Group Note (Signed)
  [facility] LCSW Group Therapy Note  Date/Time:  07/07/2022 10:00AM-11:00AM  Type of Therapy and Topic:  Group Therapy:  Ways to Love Myself and Take Care of Myself  Participation Level:  None   Description of Group This process group started with group leader playing a song entitled "Love Me More" to facilitate a discussion about the need to love and respect ourselves and prioritize taking care of ourselves, especially after hospital discharge.   There was a discussion about the need for self-love, and patients listed ways in which they can demonstrate self-love.  Patients were then asked to share how they plan to take care of themselves in a better manner when they get home from the hospital.  Group members shared ideas about making changes when they return home so that they can stay well and in recovery.  Two more songs were played including "I Am Enough" and "My Own Hero" which led to further reinforcement of the ideas given.  Patients were emotional and/or supportive of those who became emotional.  Therapeutic Goals Patient will listen and be able to relate to a song about prioritizing themselves through self-love Patient will participate in generating ideas about healthy self-care options when they return to the community Patients will be supportive of one another and receive said support from others   Summary of Patient Progress:  The patient was in and out of the room several times but did not actually stay long enough at any point to participate.  One time as he left the room, he was overheard saying "This music is too good for me."   Therapeutic Modalities Activity Motivational Interviewing Processing   Ambrose Mantle, LCSW 07/07/2022, 12:00pm

## 2022-07-07 NOTE — Progress Notes (Signed)
Adult Psychoeducational Group Note  Date:  07/07/2022 Time:  10:24 PM  Group Topic/Focus:  Wrap-Up Group:   The focus of this group is to help patients review their daily goal of treatment and discuss progress on daily workbooks.  Participation Level:  Did Not Attend  Participation Quality:  N/A  Affect:  N/A  Cognitive:  N/A  Insight: N/A  Engagement in Group:  N/A  Modes of Intervention:  N/A  Additional Comments:   Pt was encouraged to attend wrap up group but refused.  [name] 07/07/2022, 10:24 PM

## 2022-07-07 NOTE — Progress Notes (Signed)
   07/07/22 0100  Psych Admission Type (Psych Patients Only)  Admission Status Voluntary  Psychosocial Assessment  Patient Complaints Anxiety  Eye Contact Brief;Avoids  Facial Expression Flat  Affect Anxious;Preoccupied  Speech Soft  Interaction Isolative  Motor Activity Slow  Appearance/Hygiene Unremarkable  Behavior Characteristics Cooperative  Mood Anxious;Preoccupied  Thought Process  Coherency Disorganized  Content Preoccupation;Religiosity  Delusions Religious;Paranoid  Perception Hallucinations  Hallucination Auditory;Visual  Judgment Impaired  Confusion None  Danger to Self  Current suicidal ideation? Denies  Danger to Others  Danger to Others None reported or observed  Danger to Others Abnormal  Harmful Behavior to others No threats or harm toward other people

## 2022-07-08 LAB — COPPER, URINE - RANDOM OR 24 HOUR
Copper / Creatinine Ratio: 5 ug/g creat (ref 0–49)
Copper, Ur: 10 ug/L
Creatinine(Crt),U: 1.93 g/L (ref 0.30–3.00)

## 2022-07-08 MED ORDER — DIPHENHYDRAMINE HCL 25 MG PO CAPS: 50.0000 mg | ORAL_CAPSULE | Freq: Four times a day (QID) | ORAL | Status: DC | PRN

## 2022-07-08 MED ORDER — DOCUSATE SODIUM 100 MG PO CAPS
100.0000 mg | ORAL_CAPSULE | Freq: Every day | ORAL | Status: DC
  Administered 2022-07-08 – 2022-07-16 (×9): 100 mg via ORAL
  Filled 2022-07-08 (×12): qty 1

## 2022-07-08 MED ORDER — DIPHENHYDRAMINE HCL 50 MG/ML IJ SOLN: 50.0000 mg | Freq: Four times a day (QID) | INTRAMUSCULAR | Status: DC | PRN

## 2022-07-08 MED ORDER — BENZTROPINE MESYLATE 0.5 MG PO TABS
0.5000 mg | ORAL_TABLET | Freq: Two times a day (BID) | ORAL | Status: DC
  Administered 2022-07-08 – 2022-07-10 (×4): 0.5 mg via ORAL
  Filled 2022-07-08 (×9): qty 1

## 2022-07-08 MED ORDER — OLANZAPINE 15 MG PO TBDP
15.0000 mg | ORAL_TABLET | Freq: Every day | ORAL | Status: DC
  Administered 2022-07-08: 15 mg via ORAL
  Filled 2022-07-08 (×4): qty 1

## 2022-07-08 MED ORDER — RISPERIDONE 2 MG PO TBDP
2.0000 mg | ORAL_TABLET | Freq: Two times a day (BID) | ORAL | Status: DC
  Administered 2022-07-08 – 2022-07-10 (×4): 2 mg via ORAL
  Filled 2022-07-08 (×8): qty 1

## 2022-07-08 NOTE — Progress Notes (Addendum)
D. Pt presents with a depressed affect/depressed mood- has been calm and cooperative, but minimal and appears preoccupied during interactions. Per pt's self inventory, pt rated his depression,hopelessness and anxiety a 4/7/5, respectively.Pt reported that his goal was "to say shalom to three people today." Pt denied SI/HI and when asked about A/VH, pt  stated "I don't see anything crazy, just spiritual leeches." Pt given a 'hat' to collect his urine, and reminded to let staff know when he urinates. Pt expressed some understanding as to why this is necessary, stating, "I know that you all need to check for copper." Pt observed attending groups today.  A. Labs and vitals monitored. Pt given and educated on medications. Pt supported emotionally and encouraged to express concerns and ask questions.   R. Pt remains safe with 15 minute checks. Will continue POC.

## 2022-07-08 NOTE — Progress Notes (Signed)
Pt rates depression 3/10 and anxiety 0/10. Pt reports an improving appetite, and no physical problems. Pt endorse VH "see people anointed by the devil" and "see strings of dark light", denies SI/HI and verbally contracts for safety. Provided support and encouragement. Pt safe on the unit. Q 15 minute safety checks continued.

## 2022-07-08 NOTE — [facility] Group Notes (Signed)
Adult Psychoeducational Group Note  Date:  07/08/2022 Time:  9:33 AM  Group Topic/Focus:  Goals Group:   The focus of this group is to help patients establish daily goals to achieve during treatment and discuss how the patient can incorporate goal setting into their daily lives to aide in recovery.  Participation Level:  Minimal  Participation Quality:  Resistant  Affect:  Flat  Cognitive:  Delusional  Insight: Lacking  Engagement in Group:  Lacking  Modes of Intervention:  Clarification and Discussion  Additional Comments:  Pt. Stated that his goal was to," Pray for all of [place]."  Donell Beers 07/08/2022, 9:33 AM

## 2022-07-08 NOTE — Plan of Care (Signed)
I contacted the patient's father with APP present on speaker phone for the call. I updated his father on the patient's ongoing psychosis and discussed medication options given the refractory nature of his symptoms. The father feels he has worsened in the last 48 hours based on his interactions, and I advised that this may correlate with his taper down on Zyprexa from above FDA recommended max dosing back to 20mg  and start of Risperdal. His father agrees that ideally they would like him managed with monotherapy, and we discussed the goal of reducing Zyprexa slowly and up-titrating on Risperdal to see if he gets response. I advised that if he fails Risperdal trial, we would have to consider use of 2 antipsychotics (like Zyprexa and another antipsychotic), start of a typical antipsychotic like Haldol, or transition to Clozaril. His voiced understanding. Father will attempt to reiterate need to patient to collect 24 hour urine sample. Time was given for questions. Father would like to bring a barber for visitation to help with a hair cut, and I advised this would have to be approved by hospital/nursing management.   , MD, Bartholomew Crews

## 2022-07-08 NOTE — Group Note (Signed)
LCSW Group Therapy Note   Group Date: 07/08/2022 Start Time: 1300 End Time: 1400  Type of Therapy and Topic: Group Therapy: Control  Participation Level: Active  Description of Group: In this group patients will discuss what is out of their control, what is somewhat in their control, and what is within their control.  They will be encouraged to explore what issues they can control and what issues are out of their control within their daily lives. They will be guided to discuss their thoughts, feelings, and behaviors related to these issues. The group will process together ways to better control things that are well within our own control and how to notice and accept the things that are not within our control. This group will be process-oriented, with patients participating in exploration of their own experiences as well as giving and receiving support and challenge from other group members.  During this group 2 worksheets will be provided to each patient to follow along and fill out.   Therapeutic Goals: 1. Patient will identify what is within their control and what is not within their control. 2. Patient will identify their thoughts and feelings about having control over their own lives. 3. Patient will identify their thoughts and feelings about not having control over everything in their lives.. 4. Patient will identify ways that they can have more control over their own lives. 5. Patient will identify areas were they can allow others to help them or provide assistance.  Summary of Patient Progress:  The Pt attended group and remained there the entire time.  The Pt accepted all worksheets that were provided and followed along throughout the group session.  The Pt discussed what things they have control over and things they may not have control over.  The Pt was able to identify some steps to taking some additional control over their life, emotions, and actions.   Khole Branch M Delois Silvester,  LCSWA 07/08/2022  1:54 PM    

## 2022-07-08 NOTE — Progress Notes (Addendum)
[facility] MD Progress Note  07/08/2022 3:30 PM Joseph Wilson  MRN:  [id number]  Chief Complaint: psychosis  Reason for Admission:  Joseph Wilson is a 21 y.o., male with no past psychiatric history who presents to the [facility] from [facility] for evaluation and management of worsening psychosis and paranoia. According to outside records, the patient presented to the behavioral health unit for [place] via [place] police voluntarily as a walk-in with complaint paranoia, auditory and visual hallucinations.The patient is currently on Hospital Day 14.   Chart Review from last 24 hours: The patient's chart was reviewed and nursing notes were reviewed. The patient's case was discussed in multidisciplinary team meeting. Per nursing pt has attended some group sessions over the past 24 hrs, and is compliant with his scheduled medications. He has not required any agitation protocol medications or anti anxiety medications over the past 24 hrs. He has remained psychotic over the past 24 hrs as per nursing documentation. Sleep last night was recorded at 7.25 hrs, and BP has been WNL, and HR was elevated earlier today morning at 120, and rechecked by his RN and is currently 100.   Information Obtained Today During Patient Interview: The patient was seen and assessed in his room on the 400 hall by Probation officer and attending psychiatrist. Pt appeared guarded and suspicious, and stood for the entire assessment. Pt presents with flat affect and depressed mood, his attention to personal hygiene and grooming is fair, eye contact is good, speech is clear & coherent. Thoughts are organized, but contents are illogical. Pt currently denies SI/HI, but continues to endorse +AVH, paranoia & delusional thoughts.    During this assessment, pt reports feeling claustrophobic, he reports that he saw a stream coming out from his 24 hr urine collection jar and contaminating his food and that is why he  stopped urinating into the jar for testing for Wilson's disease. He reports auditory hallucinations-states he hears voices saying random things, and the voices are commanding at times, and have told him not to brush his teeth and not to take a shower. He states that the voices have also told him to go preach, fast and pray. He verbalizes a fear of being castrated by someone who wants him to be transgender, reports being in a spiritual cloud. He reports that whenever he speaks, he can get responses from people who know what he is thinking about. He states that there is "a system of people" who know his thoughts. He reports that he is able to use his thoughts to communicate to the weather man to change the weather. Attending psychiatrist spoke with father and education provided on current medications (See attending Psychiatrist's note from today).   We are slowly decreasing the dose of Zyprexa, and increasing Risperdal to manage pt's psychosis. We will increase Risperdal to 2 mg BID, and decrease Zyprexa to 15 mg nightly. Pt complained of nasal stuffiness, and we will add Flonase to his medication regimen. Mild stiffness found on b/l arms on assessment today, and we are adding Cogentin 0.5 mg BID for current medication regimen. We will continue other medications as below.  Principal Problem: Schizophrenia spectrum disorder with psychotic disorder type not yet determined ([place]) Diagnosis: Principal Problem:   Schizophrenia spectrum disorder with psychotic disorder type not yet determined [facility])  Past Psychiatric History: see H&P  Past Medical History:  Past Medical History:  Diagnosis Date   Acid reflux    Family Psychiatric  History: Psychiatric  illness: None reported Suicide: None reported Substance Abuse: None reported  Social History: see H&P  Current Medications: Current Facility-Administered Medications  Medication Dose Route Frequency Provider Last Rate Last Admin   acetaminophen (TYLENOL)  tablet 650 mg  650 mg Oral Q6H PRN Rankin, Shuvon B, NP   650 mg at 07/02/22 2052   alum & mag hydroxide-simeth (MAALOX/MYLANTA) 200-200-20 MG/5ML suspension 30 mL  30 mL Oral Q4H PRN Rankin, Shuvon B, NP       benztropine (COGENTIN) tablet 1 mg  1 mg Oral BID PRN Harlow Asa, MD       feeding supplement (ENSURE ENLIVE / ENSURE PLUS) liquid 237 mL  237 mL Oral BID BM Massengill, Ovid Curd, MD   237 mL at 07/08/22 1455   fluticasone (FLONASE) 50 MCG/ACT nasal spray 1 spray  1 spray Each Nare Daily PRN Bobbitt, Shalon E, NP   1 spray at 07/07/22 2228   hydrOXYzine (ATARAX) tablet 25 mg  25 mg Oral TID PRN Dian Situ, MD   25 mg at 07/06/22 2011   loratadine (CLARITIN) tablet 10 mg  10 mg Oral Daily Attiah, Nadir, MD   10 mg at 07/08/22 0820   OLANZapine zydis (ZYPREXA) disintegrating tablet 5 mg  5 mg Oral Q8H PRN Harlow Asa, MD       And   LORazepam (ATIVAN) tablet 1 mg  1 mg Oral PRN Harlow Asa, MD       And   ziprasidone (GEODON) injection 20 mg  20 mg Intramuscular PRN Nelda Marseille, Loney Domingo E, MD       magnesium hydroxide (MILK OF MAGNESIA) suspension 30 mL  30 mL Oral Daily PRN Rankin, Shuvon B, NP       OLANZapine zydis (ZYPREXA) disintegrating tablet 15 mg  15 mg Oral QHS Nkwenti, Doris, NP       risperiDONE (RISPERDAL M-TABS) disintegrating tablet 2 mg  2 mg Oral BID Nkwenti, Doris, NP       traZODone (DESYREL) tablet 100 mg  100 mg Oral QHS PRN Harlow Asa, MD        Lab Results:  Results for orders placed or performed during the hospital encounter of 06/24/22 (from the past 48 hour(s))  SARS Coronavirus 2 by RT PCR (hospital order, performed in [facility] hospital lab) *cepheid single result test* Anterior Nasal Swab     Status: None   Collection Time: 07/07/22 10:37 AM   Specimen: Anterior Nasal Swab  Result Value Ref Range   SARS Coronavirus 2 by RT PCR NEGATIVE NEGATIVE    Comment: (NOTE) SARS-CoV-2 target nucleic acids are NOT DETECTED.  The SARS-CoV-2 RNA is  generally detectable in upper and lower respiratory specimens during the acute phase of infection. The lowest concentration of SARS-CoV-2 viral copies this assay can detect is 250 copies / mL. A negative result does not preclude SARS-CoV-2 infection and should not be used as the sole basis for treatment or other patient management decisions.  A negative result may occur with improper specimen collection / handling, submission of specimen other than nasopharyngeal swab, presence of viral mutation(s) within the areas targeted by this assay, and inadequate number of viral copies (<250 copies / mL). A negative result must be combined with clinical observations, patient history, and epidemiological information.  Fact Sheet for Patients:   https://www.patel.info/  Fact Sheet for Healthcare Providers: https://hall.com/  This test is not yet approved or  cleared by the [place] FDA and has been authorized for  detection and/or diagnosis of SARS-CoV-2 by FDA under an Emergency Use Authorization (EUA).  This EUA will remain in effect (meaning this test can be used) for the duration of the COVID-19 declaration under Section 564(b)(1) of the Act, 21 U.S.C. section 360bbb-3(b)(1), unless the authorization is terminated or revoked sooner.  Performed at [facility], [place] [address] [place], [place] [address]      Blood Alcohol level:  Lab Results  Component Value Date   ETH <10 55/73/2202   Metabolic Disorder Labs: Lab Results  Component Value Date   HGBA1C 5.1 06/23/2022   MPG 99.67 06/23/2022   No results found for: "PROLACTIN" Lab Results  Component Value Date   CHOL 164 06/23/2022   TRIG 32 06/23/2022   HDL 63 06/23/2022   CHOLHDL 2.6 06/23/2022   VLDL 6 06/23/2022   LDLCALC 95 06/23/2022   Physical Findings: AIMS: Facial and Oral Movements Muscles of Facial Expression: None, normal Lips and Perioral Area:  None, normal Jaw: None, normal Tongue: None, normal,Extremity Movements Upper (arms, wrists, hands, fingers): None, normal Lower (legs, knees, ankles, toes): None, normal, Trunk Movements Neck, shoulders, hips: None, normal, Overall Severity Severity of abnormal movements (highest score from questions above): None, normal Incapacitation due to abnormal movements: None, normal Patient's awareness of abnormal movements (rate only patient's report): No Awareness, Dental Status Current problems with teeth and/or dentures?: No Does patient usually wear dentures?: No   Musculoskeletal: Strength & Muscle Tone: within normal limits Gait & Station: normal Patient leans: N/A  Psychiatric Specialty Exam: Physical Exam Vitals and nursing note reviewed.  Constitutional:      Appearance: Normal appearance.  HENT:     Head: Normocephalic.  Pulmonary:     Effort: Pulmonary effort is normal.  Neurological:     General: No focal deficit present.     Mental Status: He is alert.     Review of Systems  Respiratory:  Negative for shortness of breath.   Cardiovascular:  Negative for chest pain.  Gastrointestinal:  Negative for constipation, diarrhea, nausea and vomiting.  Genitourinary:  Negative for difficulty urinating.  Neurological:  Negative for headaches.  Psychiatric/Behavioral:  Positive for hallucinations. Negative for agitation, behavioral problems, confusion, decreased concentration, dysphoric mood and self-injury. The patient is not nervous/anxious and is not hyperactive.     Blood pressure 127/85, pulse 100, temperature 98.2 F (36.8 C), temperature source Oral, resp. rate 16, height _0  (1.905 m), weight 81.6 kg, SpO2 100 %.Body mass index is 22.5 kg/m.  General Appearance: Fairly Groomed and casually dressed  Eye Contact:  Fair  Speech: clear and coherent, normal fluency - non-spontaneous but answers direct questions with short responses  Volume:  Decreased  Mood:  described  as "good" - appears calm but aloof  Affect:  Constricted and guarded  Thought Process:  Linear but concrete - no thought blocking today  Orientation:  to self, year, month and city and improved insight into reason for admission  Thought Content: Hyper-religious thought content, persecutory delusions and some paranoia on exam; Denies SI or HI; belief others can hear his thoughts and that 24 hour urine collection was contaminating his food; belief he can influence the weather - is not grossly responding to internal/external stimuli during assessment but guarded on exam  Suicidal Thoughts:  No  Homicidal Thoughts:  No  Memory:   limited secondary to psychosis  Judgement:  Fair - compliant with meds and labs  Insight:  Shallow  Psychomotor Activity:  Normal,  no akathisias or tremors noted - some stiffness in bilateral UE no cogwheeling  Concentration:  Concentration: Fair and Attention Span: Fair  Recall:   Limited secondary to psychosis  Fund of Knowledge:  Fair  Language:  Fair  Akathisia:  Negative  AIMS (if indicated):   0  Assets:  Resilience Social Support  ADL's:  Intact  Sleep: total time unrecorded   Treatment Plan Summary: Diagnoses / Active Problems: Unspecified schizophrenia spectrum and other psychotic d/o (r/o schizophreniform d/o, r/o schizophrenia, r/o psychosis secondary to general medical condition)  PLAN: Safety and Monitoring:             -- Voluntary admission to inpatient psychiatric unit for safety, stabilization and treatment             -- Daily contact with patient to assess and evaluate symptoms and progress in treatment             -- Patient's case to be discussed in multi-disciplinary team meeting             -- Observation Level : q15 minute checks             -- Vital signs:  q12 hours             -- Precautions: suicide, elopement, and assault   2. Psychiatric Diagnoses and Treatment:  Unspecified schizophrenia spectrum and other psychotic d/o   -Increase Risperdal M tabs to 2 mg BID mg for psychosis - goal of cross tapering onto monotherapy if tolerated - consider LAI if responds to this medication - Decrease Zyprexa Zydis to 15 mg qhs with goal of tapering to monotherapy on Risperdal as tolerated (Lipid panel WNL, A1c 5.1, QTc 346m and on repeat 8/2 3910m Repeat EKG Pending for trending of QTC - Start Cogentin 0.54m34mid for EPS and monitor for constipation on med             -- Continue Vistaril 254m10md as needed for anxiety            -- Continue trazodone 100mg83mnight as needed for sleep   -- New onset psychosis w/u (Head CT noncontrast negative on 7/24; folate 13.5, B12 578, UDS negative, TSH 2.775, ETOH <10, Mag 2.0, CMP WNL other than anion gap 4, repeat CBC WNL, ESR 0, HIV nonreactive, RPR nonreactive, heavy metal WNL, ANA negative, Ceruloplasmin low at 13.8)  B1 pending               3.Medical Issues Being Addressed:   Leukopenia - resolved  -- Repeat WBC 6.0 and ANC 1800 on 8/5   Low Ceruloplasmin   -- Checking 24 hour urine copper if he will cooperate to provide sample and serum copper pending   Nasal congestion  - Start flonase PRN and continue Claritin 10mg 54my   Constipation  - Start colace 100mg d66m and offer MOM  - Monitor while on Cogentin  4. Discharge Planning:   -- Social work and case management to assist with discharge planning and identification of hospital follow-up needs prior to discharge  -- Discharge Concerns: Need to establish a safety plan; Medication compliance and effectiveness  -- Discharge Goals: Return home with outpatient referrals for mental health follow-up including medication management/psychotherapy  Doris  Nicholes Rough7/2023, 3:30 PM   Patient ID: Joseph Wilson  DOB: [birth year] MRN: [id number] ID: Joseph Wilson  DOB: [birth year] MRN:  [id number]

## 2022-07-09 LAB — VITAMIN B1: Vitamin B1 (Thiamine): 85.7 nmol/L (ref 66.5–200.0)

## 2022-07-09 MED ORDER — PROPRANOLOL HCL 10 MG PO TABS
10.0000 mg | ORAL_TABLET | Freq: Two times a day (BID) | ORAL | Status: DC
  Administered 2022-07-09 – 2022-07-10 (×2): 10 mg via ORAL
  Filled 2022-07-09 (×6): qty 1

## 2022-07-09 MED ORDER — OLANZAPINE 10 MG PO TBDP
20.0000 mg | ORAL_TABLET | Freq: Every day | ORAL | Status: DC
  Administered 2022-07-09: 20 mg via ORAL
  Filled 2022-07-09 (×4): qty 2

## 2022-07-09 MED ORDER — OLANZAPINE 10 MG PO TBDP
10.0000 mg | ORAL_TABLET | Freq: Every day | ORAL | Status: DC
  Administered 2022-07-10 – 2022-07-22 (×13): 10 mg via ORAL
  Filled 2022-07-09 (×15): qty 1

## 2022-07-09 NOTE — Plan of Care (Signed)
  Problem: Activity: Goal: Interest or engagement in activities will improve Outcome: Progressing   Problem: Coping: Goal: Ability to demonstrate self-control will improve Outcome: Progressing   Problem: Safety: Goal: Periods of time without injury will increase Outcome: Progressing   Problem: Education: Goal: Knowledge of the prescribed therapeutic regimen will improve Outcome: Progressing   Problem: Health Behavior/Discharge Planning: Goal: Compliance with therapeutic regimen will improve Outcome: Progressing

## 2022-07-09 NOTE — Progress Notes (Signed)
[facility] MD Progress Note  07/09/2022 3:35 PM Joseph Wilson  MRN:  [id number]  Chief Complaint: psychosis  Reason for Admission:  Joseph Wilson is a 21 y.o., male with no past psychiatric history who presents to the [facility] from [facility] for evaluation and management of worsening psychosis and paranoia. According to outside records, the patient presented to the behavioral health unit for [place] via [place] police voluntarily as a walk-in with complaint paranoia, auditory and visual hallucinations.The patient is currently on Hospital Day 15.   Chart Review from last 24 hours: The patient's chart was reviewed and nursing notes were reviewed. The patient's case was discussed in multidisciplinary team meeting. Per nursing pt has attended some group sessions over the past 24 hrs, and is compliant with his scheduled medications. He has not required any agitation protocol medications or anti anxiety medications over the past 24 hrs. He has remained psychotic over the past 24 hrs as per nursing documentation. BP has been WNL, and HR was elevated earlier today morning at 115.  Information Obtained Today During Patient Interview: The patient was seen and assessed in his room on the 400 hall by Probation officer and attending psychiatrist. Pt appeared guarded and suspicious, and stood for the entire assessment. Pt presents with flat affect and depressed mood, his attention to personal hygiene and grooming continues to be fair, eye contact is good, speech is clear & coherent. Thoughts are organized, but contents continue to be illogical. Pt currently denies SI/HI, but is continuing to endorse +AVH, paranoia & delusional thoughts.    During this assessment, pt reports that he is hearing voices which are whispering, and seeing "white mists in the air". He presents with thought broadcasting and states that specific people on tv know what he is thinking. He also presents with thought  insertion and thought withdrawal and states that false prophets and Satanists put thoughts in his brain and take some thoughts out of his brain. Pt states that witchcraft is being put in his food, and he prefers to see his food being prepared.  Pt states that he has to watch for his pitcher of water to be filled prior to drinking the water because he does not trust the staff here.  Pt is currently on Zyprexa 15 mg nightly and 2 mg of Risperdal BID. We are adjusting medications as listed below. We are adding Inderal 10 mg BID to pt's medication regimen for management of tachycardia and anxiety.  Principal Problem: Schizophrenia spectrum disorder with psychotic disorder type not yet determined ([place]) Diagnosis: Principal Problem:   Schizophrenia spectrum disorder with psychotic disorder type not yet determined [facility])  Past Psychiatric History: see H&P  Past Medical History:  Past Medical History:  Diagnosis Date   Acid reflux    Family Psychiatric  History: Psychiatric illness: None reported Suicide: None reported Substance Abuse: None reported  Social History: see H&P  Current Medications: Current Facility-Administered Medications  Medication Dose Route Frequency Provider Last Rate Last Admin   acetaminophen (TYLENOL) tablet 650 mg  650 mg Oral Q6H PRN Rankin, Shuvon B, NP   650 mg at 07/02/22 2052   alum & mag hydroxide-simeth (MAALOX/MYLANTA) 200-200-20 MG/5ML suspension 30 mL  30 mL Oral Q4H PRN Rankin, Shuvon B, NP       benztropine (COGENTIN) tablet 0.5 mg  0.5 mg Oral BID Nelda Marseille, Amy E, MD   0.5 mg at 07/09/22 1024   diphenhydrAMINE (BENADRYL) capsule 50 mg  50 mg  Oral Q6H PRN Harlow Asa, MD       Or   diphenhydrAMINE (BENADRYL) injection 50 mg  50 mg Intramuscular Q6H PRN Nelda Marseille, Amy E, MD       docusate sodium (COLACE) capsule 100 mg  100 mg Oral Daily Nelda Marseille, Amy E, MD   100 mg at 07/09/22 1024   feeding supplement (ENSURE ENLIVE / ENSURE PLUS) liquid 237 mL  237  mL Oral BID BM Massengill, Ovid Curd, MD   237 mL at 07/09/22 1025   fluticasone (FLONASE) 50 MCG/ACT nasal spray 1 spray  1 spray Each Nare Daily PRN Bobbitt, Shalon E, NP   1 spray at 07/08/22 2126   hydrOXYzine (ATARAX) tablet 25 mg  25 mg Oral TID PRN Dian Situ, MD   25 mg at 07/06/22 2011   loratadine (CLARITIN) tablet 10 mg  10 mg Oral Daily Attiah, Nadir, MD   10 mg at 07/09/22 1024   OLANZapine zydis (ZYPREXA) disintegrating tablet 5 mg  5 mg Oral Q8H PRN Harlow Asa, MD       And   LORazepam (ATIVAN) tablet 1 mg  1 mg Oral PRN Harlow Asa, MD       And   ziprasidone (GEODON) injection 20 mg  20 mg Intramuscular PRN Nelda Marseille, Amy E, MD       magnesium hydroxide (MILK OF MAGNESIA) suspension 30 mL  30 mL Oral Daily PRN Rankin, Shuvon B, NP   30 mL at 07/08/22 1653   [START ON 07/10/2022] OLANZapine zydis (ZYPREXA) disintegrating tablet 10 mg  10 mg Oral Daily Shavaughn Seidl, NP       OLANZapine zydis (ZYPREXA) disintegrating tablet 20 mg  20 mg Oral QHS Herman Fiero, NP       propranolol (INDERAL) tablet 10 mg  10 mg Oral BID Ralphael [place], NP       risperiDONE (RISPERDAL M-TABS) disintegrating tablet 2 mg  2 mg Oral BID Blakely Gluth, NP   2 mg at 07/09/22 1023   traZODone (DESYREL) tablet 100 mg  100 mg Oral QHS PRN Harlow Asa, MD   100 mg at 07/08/22 2124   Lab Results:  No results found for this or any previous visit (from the past 67 hour(s)).  Blood Alcohol level:  Lab Results  Component Value Date   ETH <10 42/35/3614   Metabolic Disorder Labs: Lab Results  Component Value Date   HGBA1C 5.1 06/23/2022   MPG 99.67 06/23/2022   No results found for: "PROLACTIN" Lab Results  Component Value Date   CHOL 164 06/23/2022   TRIG 32 06/23/2022   HDL 63 06/23/2022   CHOLHDL 2.6 06/23/2022   VLDL 6 06/23/2022   LDLCALC 95 06/23/2022   Physical Findings: AIMS: Facial and Oral Movements Muscles of Facial Expression: None, normal Lips and Perioral Area:  None, normal Jaw: None, normal Tongue: None, normal,Extremity Movements Upper (arms, wrists, hands, fingers): None, normal Lower (legs, knees, ankles, toes): None, normal, Trunk Movements Neck, shoulders, hips: None, normal, Overall Severity Severity of abnormal movements (highest score from questions above): None, normal Incapacitation due to abnormal movements: None, normal Patient's awareness of abnormal movements (rate only patient's report): No Awareness, Dental Status Current problems with teeth and/or dentures?: No Does patient usually wear dentures?: No   Musculoskeletal: Strength & Muscle Tone: within normal limits Gait & Station: normal Patient leans: N/A  Psychiatric Specialty Exam: Physical Exam Vitals and nursing note reviewed.  Constitutional:      Appearance:  Normal appearance.  HENT:     Head: Normocephalic.  Pulmonary:     Effort: Pulmonary effort is normal.  Neurological:     General: No focal deficit present.     Mental Status: He is alert.     Review of Systems  Respiratory:  Negative for shortness of breath.   Cardiovascular:  Negative for chest pain.  Gastrointestinal:  Negative for constipation, diarrhea, nausea and vomiting.  Genitourinary:  Negative for difficulty urinating.  Neurological:  Negative for headaches.  Psychiatric/Behavioral:  Positive for hallucinations. Negative for agitation, behavioral problems, confusion, decreased concentration, dysphoric mood and self-injury. The patient is not nervous/anxious and is not hyperactive.     Blood pressure 116/61, pulse (!) 116, temperature 98.2 F (36.8 C), temperature source Oral, resp. rate 16, height _0  (1.905 m), weight 81.6 kg, SpO2 98 %.Body mass index is 22.5 kg/m.  General Appearance: Fairly Groomed and casually dressed  Eye Contact:  Fair  Speech: clear and coherent, normal fluency - non-spontaneous but answers direct questions with short responses  Volume:  Decreased  Mood:   described as "good" - appears calm but aloof  Affect:  Constricted and guarded  Thought Process:  Linear but concrete - no thought blocking today  Orientation:  to self, year, month and city and improved insight into reason for admission  Thought Content: Hyper-religious thought content, persecutory delusions and some paranoia on exam; Denies SI or HI; belief others can hear his thoughts and that 24 hour urine collection was contaminating his food; belief he can influence the weather - is not grossly responding to internal/external stimuli during assessment but guarded on exam  Suicidal Thoughts:  No  Homicidal Thoughts:  No  Memory:   limited secondary to psychosis  Judgement:  Fair - compliant with meds and labs  Insight:  Shallow  Psychomotor Activity:  Normal, no akathisias or tremors noted - some stiffness in bilateral UE no cogwheeling  Concentration:  Concentration: Fair and Attention Span: Fair  Recall:   Limited secondary to psychosis  Fund of Knowledge:  Fair  Language:  Fair  Akathisia:  Negative  AIMS (if indicated):   0  Assets:  Resilience Social Support  ADL's:  Intact  Sleep: total time unrecorded   Treatment Plan Summary: Diagnoses / Active Problems: Unspecified schizophrenia spectrum and other psychotic d/o (r/o schizophreniform d/o, r/o schizophrenia, r/o psychosis secondary to general medical condition)  PLAN: Safety and Monitoring:             -- Voluntary admission to inpatient psychiatric unit for safety, stabilization and treatment             -- Daily contact with patient to assess and evaluate symptoms and progress in treatment             -- Patient's case to be discussed in multi-disciplinary team meeting             -- Observation Level : q15 minute checks             -- Vital signs:  q12 hours             -- Precautions: suicide, elopement, and assault   2. Psychiatric Diagnoses and Treatment:  Unspecified schizophrenia spectrum and other psychotic  d/o  -Continue Risperdal M tabs to 2 mg BID mg for psychosis - goal of cross tapering onto monotherapy if tolerated - consider LAI if responds to this medication - Increase  Zyprexa Zydis to 10 mg in  the mornings and 20 mg qhs with goal of tapering to monotherapy. Pt showed some improvement on the Zyprexa last week as per his treatment team, and father also states that he looked better on this medication. Our plan is to slowly decrease Risperdal and wean if off as pt shows improvement on Zyprexa. (Lipid panel WNL, A1c 5.1, QTc 350m and on repeat 8/2 3944m  - Continue Cogentin 0.72m80mid for EPS and monitor for constipation on med             -- Continue Vistaril 272m64md as needed for anxiety            -- Continue trazodone 100mg58mnight as needed for sleep   -- New onset psychosis w/u (Head CT noncontrast negative on 7/24; folate 13.5, B12 578, UDS negative, TSH 2.775, ETOH <10, Mag 2.0, CMP WNL other than anion gap 4, repeat CBC WNL, ESR 0, HIV nonreactive, RPR nonreactive, heavy metal WNL, ANA negative, Ceruloplasmin low at 13.8)  B1 pending               3.Medical Issues Being Addressed:   Leukopenia - resolved  -- Repeat WBC 6.0 and ANC 1800 on 8/5   Low Ceruloplasmin   -- Checking 24 hour urine copper if he will cooperate to provide sample and serum copper pending  Tachycardia -Start Inderal 10 mg BID for tachycardia   Nasal congestion  - Start flonase PRN and continue Claritin 10mg 46my   Constipation  - Start colace 100mg d34m and offer MOM  - Monitor while on Cogentin  4. Discharge Planning:   -- Social work and case management to assist with discharge planning and identification of hospital follow-up needs prior to discharge  -- Discharge Concerns: Need to establish a safety plan; Medication compliance and effectiveness  -- Discharge Goals: Return home with outpatient referrals for mental health follow-up including medication management/psychotherapy  Marifer Hurd  Nicholes Rough/07/2022, 3:35 PM   Patient ID: Joseph Wilson  DOB: Wilson Joseph Wilson  DOB: [birth year] MRN: [id number] ID: Joseph Wilson  DOB: [birth year] MRN: [id number]

## 2022-07-10 ENCOUNTER — Encounter (HOSPITAL_COMMUNITY): Payer: Self-pay

## 2022-07-10 MED ORDER — PROPRANOLOL HCL 10 MG PO TABS
10.0000 mg | ORAL_TABLET | Freq: Two times a day (BID) | ORAL | Status: DC
  Administered 2022-07-10 – 2022-07-24 (×27): 10 mg via ORAL
  Filled 2022-07-10 (×33): qty 1

## 2022-07-10 MED ORDER — OLANZAPINE 10 MG PO TBDP
20.0000 mg | ORAL_TABLET | Freq: Every day | ORAL | Status: DC
  Administered 2022-07-10: 10 mg via ORAL
  Administered 2022-07-11 – 2022-07-24 (×13): 20 mg via ORAL
  Filled 2022-07-10 (×18): qty 2

## 2022-07-10 MED ORDER — BENZTROPINE MESYLATE 0.5 MG PO TABS
0.5000 mg | ORAL_TABLET | Freq: Two times a day (BID) | ORAL | Status: DC
  Administered 2022-07-10 – 2022-07-26 (×31): 0.5 mg via ORAL
  Filled 2022-07-10 (×39): qty 1

## 2022-07-10 MED ORDER — RISPERIDONE 2 MG PO TBDP
2.0000 mg | ORAL_TABLET | Freq: Every day | ORAL | Status: DC
  Administered 2022-07-10 – 2022-07-12 (×3): 2 mg via ORAL
  Filled 2022-07-10 (×6): qty 1

## 2022-07-10 NOTE — BH IP Treatment Plan (Signed)
Interdisciplinary Treatment and Diagnostic Plan Update  07/10/2022 Time of Session: 9:45am  Joseph Wilson MRN: [id number]  Principal Diagnosis: Schizophrenia spectrum disorder with psychotic disorder type not yet determined [facility])  Secondary Diagnoses: Principal Problem:   Schizophrenia spectrum disorder with psychotic disorder type not yet determined (H[facility])   Current Medications:  Current Facility-Administered Medications  Medication Dose Route Frequency Provider Last Rate Last Admin   acetaminophen (TYLENOL) tablet 650 mg  650 mg Oral Q6H PRN Rankin, Shuvon B, NP   650 mg at 07/02/22 2052   alum & mag hydroxide-simeth (MAALOX/MYLANTA) 200-200-20 MG/5ML suspension 30 mL  30 mL Oral Q4H PRN Rankin, Shuvon B, NP       benztropine (COGENTIN) tablet 0.5 mg  0.5 mg Oral BID Mason Jim, Amy E, MD   0.5 mg at 07/10/22 0817   diphenhydrAMINE (BENADRYL) capsule 50 mg  50 mg Oral Q6H PRN Comer Locket, MD       Or   diphenhydrAMINE (BENADRYL) injection 50 mg  50 mg Intramuscular Q6H PRN Mason Jim, Amy E, MD       docusate sodium (COLACE) capsule 100 mg  100 mg Oral Daily Mason Jim, Amy E, MD   100 mg at 07/10/22 0817   feeding supplement (ENSURE ENLIVE / ENSURE PLUS) liquid 237 mL  237 mL Oral BID BM Massengill, Harrold Donath, MD   237 mL at 07/09/22 1637   fluticasone (FLONASE) 50 MCG/ACT nasal spray 1 spray  1 spray Each Nare Daily PRN Bobbitt, Shalon E, NP   1 spray at 07/08/22 2126   hydrOXYzine (ATARAX) tablet 25 mg  25 mg Oral TID PRN Sarita Bottom, MD   25 mg at 07/09/22 2120   loratadine (CLARITIN) tablet 10 mg  10 mg Oral Daily Attiah, Nadir, MD   10 mg at 07/10/22 0817   OLANZapine zydis (ZYPREXA) disintegrating tablet 5 mg  5 mg Oral Q8H PRN Comer Locket, MD       And   LORazepam (ATIVAN) tablet 1 mg  1 mg Oral PRN Comer Locket, MD       And   ziprasidone (GEODON) injection 20 mg  20 mg Intramuscular PRN Mason Jim, Amy E, MD       magnesium hydroxide (MILK OF MAGNESIA) suspension 30  mL  30 mL Oral Daily PRN Rankin, Shuvon B, NP   30 mL at 07/08/22 1653   OLANZapine zydis (ZYPREXA) disintegrating tablet 10 mg  10 mg Oral Daily Nkwenti, Doris, NP   10 mg at 07/10/22 0817   OLANZapine zydis (ZYPREXA) disintegrating tablet 20 mg  20 mg Oral QHS Nkwenti, Doris, NP   20 mg at 07/09/22 2120   propranolol (INDERAL) tablet 10 mg  10 mg Oral BID Starleen Blue, NP   10 mg at 07/10/22 0817   risperiDONE (RISPERDAL M-TABS) disintegrating tablet 2 mg  2 mg Oral BID Starleen Blue, NP   2 mg at 07/10/22 0817   traZODone (DESYREL) tablet 100 mg  100 mg Oral QHS PRN Comer Locket, MD   100 mg at 07/09/22 2119   PTA Medications: Medications Prior to Admission  Medication Sig Dispense Refill Last Dose   ASHWAGANDHA PO Take 1 capsule by mouth in the morning and at bedtime.      hydrOXYzine (ATARAX) 25 MG tablet Take 1 tablet (25 mg total) by mouth 3 (three) times daily as needed for anxiety. 30 tablet 0    Multiple Vitamins-Minerals (ADULT GUMMY PO) Take 1 tablet by mouth daily.  neomycin-bacitracin-polymyxin (NEOSPORIN) [phone] ointment Apply 1 Application topically daily as needed (Apply to affected area).      OLANZapine zydis (ZYPREXA) 5 MG disintegrating tablet Take 1 tablet (5 mg total) by mouth at bedtime.      traZODone (DESYREL) 50 MG tablet Take 1 tablet (50 mg total) by mouth at bedtime as needed for sleep.       Patient Stressors: Other: psychosis    Patient Strengths: Average or above average intelligence  Communication skills  Motivation for treatment/growth  Physical Health  Religious Affiliation  Supportive family/friends   Treatment Modalities: Medication Management, Group therapy, Case management,  1 to 1 session with clinician, Psychoeducation, Recreational therapy.   Physician Treatment Plan for Primary Diagnosis: Schizophrenia spectrum disorder with psychotic disorder type not yet determined (HCC) Long Term Goal(s): Improvement in symptoms so as ready  for discharge   Short Term Goals: Ability to identify changes in lifestyle to reduce recurrence of condition will improve Ability to verbalize feelings will improve Ability to disclose and discuss suicidal ideas Ability to demonstrate self-control will improve  Medication Management: Evaluate patient's response, side effects, and tolerance of medication regimen.  Therapeutic Interventions: 1 to 1 sessions, Unit Group sessions and Medication administration.  Evaluation of Outcomes: Progressing  Physician Treatment Plan for Secondary Diagnosis: Principal Problem:   Schizophrenia spectrum disorder with psychotic disorder type not yet determined (HCC)  Long Term Goal(s): Improvement in symptoms so as ready for discharge   Short Term Goals: Ability to identify changes in lifestyle to reduce recurrence of condition will improve Ability to verbalize feelings will improve Ability to disclose and discuss suicidal ideas Ability to demonstrate self-control will improve     Medication Management: Evaluate patient's response, side effects, and tolerance of medication regimen.  Therapeutic Interventions: 1 to 1 sessions, Unit Group sessions and Medication administration.  Evaluation of Outcomes: Progressing   RN Treatment Plan for Primary Diagnosis: Schizophrenia spectrum disorder with psychotic disorder type not yet determined (HCC) Long Term Goal(s): Knowledge of disease and therapeutic regimen to maintain health will improve  Short Term Goals: Ability to remain free from injury will improve, Ability to participate in decision making will improve, Ability to verbalize feelings will improve, Ability to disclose and discuss suicidal ideas, and Ability to identify and develop effective coping behaviors will improve  Medication Management: RN will administer medications as ordered by provider, will assess and evaluate patient's response and provide education to patient for prescribed medication.  RN will report any adverse and/or side effects to prescribing provider.  Therapeutic Interventions: 1 on 1 counseling sessions, Psychoeducation, Medication administration, Evaluate responses to treatment, Monitor vital signs and CBGs as ordered, Perform/monitor CIWA, COWS, AIMS and Fall Risk screenings as ordered, Perform wound care treatments as ordered.  Evaluation of Outcomes: Progressing   LCSW Treatment Plan for Primary Diagnosis: Schizophrenia spectrum disorder with psychotic disorder type not yet determined (HCC) Long Term Goal(s): Safe transition to appropriate next level of care at discharge, Engage patient in therapeutic group addressing interpersonal concerns.  Short Term Goals: Engage patient in aftercare planning with referrals and resources, Increase social support, Increase emotional regulation, Facilitate acceptance of mental health diagnosis and concerns, Identify triggers associated with mental health/substance abuse issues, and Increase skills for wellness and recovery  Therapeutic Interventions: Assess for all discharge needs, 1 to 1 time with Social worker, Explore available resources and support systems, Assess for adequacy in community support network, Educate family and significant other(s) on suicide prevention, Complete Psychosocial Assessment, Interpersonal  group therapy.  Evaluation of Outcomes: Progressing   Progress in Treatment: Attending groups: Yes. Participating in groups: Yes. Taking medication as prescribed: Yes. Toleration medication: Yes. Family/Significant other contact made: Yes, individual(s) contacted:  mother and father Patient understands diagnosis: No. Discussing patient identified problems/goals with staff: Yes. Medical problems stabilized or resolved: Yes. Denies suicidal/homicidal ideation: Yes. Issues/concerns per patient self-inventory: No.     New problem(s) identified: No, Describe:  none reported   New Short Term/Long Term Goal(s):     medication stabilization, elimination of SI thoughts, development of comprehensive mental wellness plan.      Patient Goals:  Pt continues to work on goals from initial treatment team.     Discharge Plan or Barriers: No psychosocial barriers at this time. Patient has been connected with med management and therapy upon discharge.    Reason for Continuation of Hospitalization: Delusions  Hallucinations Medication stabilization   Estimated Length of Stay: 3-7 days   Last 3 [place] Suicide Severity Risk Score: Flowsheet Row Ad[facility] (Current) [facility]  C-SSRS RISK CATEGORY No Risk No Risk       Last PHQ 2/9 Scores:    06/23/2022    9:44 AM  Depression screen PHQ 2/9  Decreased Interest 0  Down, Depressed, Hopeless 0  PHQ - 2 Score 0    Scribe for Treatment Team: Aram Beecham, Theresia Majors 07/10/2022 9:20 AM

## 2022-07-10 NOTE — Group Note (Signed)
LCSW Group Therapy Note   Group Date: 07/10/2022 Start Time: 1300 End Time: 1400  LCSW Group Therapy Note  Type of Therapy/Topic: Group Therapy: Six Dimensions of Wellness  Participation Level: Active  Description of Group: This group will address the concept of wellness and the six concepts of wellness: occupational, physical, social, intellectual, spiritual, and emotional. Patients will be encouraged to process areas in their lives that are out of balance and identify reasons for remaining unbalanced. Patients will be encouraged to explore ways to practice healthy habits daily to attain better physical and mental health outcomes.   Therapeutic Goals: 1. Identify aspects of wellness that they are doing well. 2. Identify aspects of wellness that they would like to improve upon. 3. Identify one action they can take to improve an aspect of wellness in their lives.   Summary of Patient Progress:  The Pt attended group and remained there the entire time.  He shared that one thing he does for wellness is going outside and working out.  The Pt accepted all worksheets and followed along throughout the session.  The Pt was appropriate with their peers and was able to identify was to achieve wellness daily.   Therapeutic Modalities: Cognitive Behavioral Therapy Solution-Focused Therapy Relapse Prevention  Aram Beecham, LCSWA 07/10/2022  1:50 PM

## 2022-07-10 NOTE — Progress Notes (Signed)
[facility] MD Progress Note  07/10/2022 2:38 PM Anush AMRO WINEBARGER  MRN:  [id number]  Chief Complaint: psychosis  Reason for Admission:  Joseph Wilson is a 21 y.o., male with no past psychiatric history who presents to the [facility] from [facility] for evaluation and management of worsening psychosis and paranoia. According to outside records, the patient presented to the behavioral health unit for [place] via [place] police voluntarily as a walk-in with complaint paranoia, auditory and visual hallucinations.The patient is currently on Hospital Day 16.   Chart Review from last 24 hours: The patient's chart was reviewed and nursing notes were reviewed. The patient's case was discussed in multidisciplinary team meeting. V/S within the past 24 hrs have been WNL. As per nursing flow sheets, he slept for a total of 7 hours last night. Pt required a dose of Hydroxyzine 25 mg for anxiety last night, and also required a dose of Trazodone 100 mg for insomnia. He remains compliant with all scheduled medications, and is attending some unit group sessions.   Information Obtained Today During Patient Interview: The patient was seen and assessed in his room on the [address] hall by Probation officer. Pt reported feeling tired, seemed slightly sedated during this encounter. Pt continues to present with a flat affect and depressed mood.  His attention to personal hygiene and grooming continues to be fair, eye contact is good, speech is clear & coherent. Thoughts are organized, but contents continue to be illogical. Pt currently denies SI/HI, but is continuing to endorse +AVH, paranoia & delusional thoughts. There is however, and improvement in his psychosis and delusions as compared to yesterday.   During this assessment, pt reports that the voices have lessened in intensity, and states that he heard one voice earlier today telling him to "get some coffee". He states that the voice was not frightful.  He reports +VH of a "red line", and presents with delusions of persecution, and states that he is afraid that "false prophets" are out to harm him. He states that he is being watched by hackers, and people know what he is thinking about, he is unsure who.   Pt's psychotic symptoms are less in intensity as compared to yesterday. We will discontinue Risperdal in the mornings and continue the 2 mg nightly. We will continue Zyprexa 10 mg in the mornings and 20 mg nightly for management of pt's psychosis. We will continue other medications as listed below.  Principal Problem: Schizophrenia spectrum disorder with psychotic disorder type not yet determined ([place]) Diagnosis: Principal Problem:   Schizophrenia spectrum disorder with psychotic disorder type not yet determined [facility])  Past Psychiatric History: see H&P  Past Medical History:  Past Medical History:  Diagnosis Date   Acid reflux    Family Psychiatric  History: Psychiatric illness: None reported Suicide: None reported Substance Abuse: None reported  Social History: see H&P  Current Medications: Current Facility-Administered Medications  Medication Dose Route Frequency Provider Last Rate Last Admin   acetaminophen (TYLENOL) tablet 650 mg  650 mg Oral Q6H PRN Rankin, Shuvon B, NP   650 mg at 07/02/22 2052   alum & mag hydroxide-simeth (MAALOX/MYLANTA) 200-200-20 MG/5ML suspension 30 mL  30 mL Oral Q4H PRN Rankin, Shuvon B, NP       benztropine (COGENTIN) tablet 0.5 mg  0.5 mg Oral BID Tonilynn Bieker, NP       diphenhydrAMINE (BENADRYL) capsule 50 mg  50 mg Oral Q6H PRN Harlow Asa, MD  Or   diphenhydrAMINE (BENADRYL) injection 50 mg  50 mg Intramuscular Q6H PRN Nelda Marseille, Amy E, MD       docusate sodium (COLACE) capsule 100 mg  100 mg Oral Daily Nelda Marseille, Amy E, MD   100 mg at 07/10/22 0817   feeding supplement (ENSURE ENLIVE / ENSURE PLUS) liquid 237 mL  237 mL Oral BID BM Massengill, Ovid Curd, MD   237 mL at 07/10/22 0950    fluticasone (FLONASE) 50 MCG/ACT nasal spray 1 spray  1 spray Each Nare Daily PRN Bobbitt, Shalon E, NP   1 spray at 07/08/22 2126   hydrOXYzine (ATARAX) tablet 25 mg  25 mg Oral TID PRN Dian Situ, MD   25 mg at 07/09/22 2120   loratadine (CLARITIN) tablet 10 mg  10 mg Oral Daily Winfred Leeds, Nadir, MD   10 mg at 07/10/22 0817   OLANZapine zydis (ZYPREXA) disintegrating tablet 5 mg  5 mg Oral Q8H PRN Harlow Asa, MD       And   LORazepam (ATIVAN) tablet 1 mg  1 mg Oral PRN Harlow Asa, MD       And   ziprasidone (GEODON) injection 20 mg  20 mg Intramuscular PRN Nelda Marseille, Amy E, MD       magnesium hydroxide (MILK OF MAGNESIA) suspension 30 mL  30 mL Oral Daily PRN Rankin, Shuvon B, NP   30 mL at 07/08/22 1653   OLANZapine zydis (ZYPREXA) disintegrating tablet 10 mg  10 mg Oral Daily Nicholes Rough, NP   10 mg at 07/10/22 0817   OLANZapine zydis (ZYPREXA) disintegrating tablet 20 mg  20 mg Oral QHS Dreyah Montrose, NP       propranolol (INDERAL) tablet 10 mg  10 mg Oral BID Janelis Stelzer, NP       risperiDONE (RISPERDAL M-TABS) disintegrating tablet 2 mg  2 mg Oral QHS Makiyah Zentz, NP       traZODone (DESYREL) tablet 100 mg  100 mg Oral QHS PRN Nicholes Rough, NP   100 mg at 07/09/22 2119   Lab Results:  No results found for this or any previous visit (from the past 53 hour(s)).  Blood Alcohol level:  Lab Results  Component Value Date   ETH <10 34/19/3790   Metabolic Disorder Labs: Lab Results  Component Value Date   HGBA1C 5.1 06/23/2022   MPG 99.67 06/23/2022   No results found for: "PROLACTIN" Lab Results  Component Value Date   CHOL 164 06/23/2022   TRIG 32 06/23/2022   HDL 63 06/23/2022   CHOLHDL 2.6 06/23/2022   VLDL 6 06/23/2022   LDLCALC 95 06/23/2022   Physical Findings: AIMS: Facial and Oral Movements Muscles of Facial Expression: None, normal Lips and Perioral Area: None, normal Jaw: None, normal Tongue: None, normal,Extremity Movements Upper (arms,  wrists, hands, fingers): None, normal Lower (legs, knees, ankles, toes): None, normal, Trunk Movements Neck, shoulders, hips: None, normal, Overall Severity Severity of abnormal movements (highest score from questions above): None, normal Incapacitation due to abnormal movements: None, normal Patient's awareness of abnormal movements (rate only patient's report): No Awareness, Dental Status Current problems with teeth and/or dentures?: No Does patient usually wear dentures?: No   Musculoskeletal: Strength & Muscle Tone: within normal limits Gait & Station: normal Patient leans: N/A  Psychiatric Specialty Exam: Physical Exam Vitals and nursing note reviewed.  Constitutional:      Appearance: Normal appearance.  HENT:     Head: Normocephalic.  Pulmonary:     Effort:  Pulmonary effort is normal.  Neurological:     General: No focal deficit present.     Mental Status: He is alert.     Review of Systems  Respiratory:  Negative for shortness of breath.   Cardiovascular:  Negative for chest pain.  Gastrointestinal:  Negative for constipation, diarrhea, nausea and vomiting.  Genitourinary:  Negative for difficulty urinating.  Neurological:  Negative for headaches.  Psychiatric/Behavioral:  Positive for hallucinations. Negative for agitation, behavioral problems, confusion, decreased concentration, dysphoric mood and self-injury. The patient is not nervous/anxious and is not hyperactive.     Blood pressure 118/70, pulse 93, temperature 98 F (36.7 C), temperature source Oral, resp. rate 16, height '6\' 3"'  (1.905 m), weight 81.6 kg, SpO2 99 %.Body mass index is 22.5 kg/m.  General Appearance: Fairly Groomed and casually dressed  Eye Contact:  Fair  Speech: clear and coherent, normal fluency - non-spontaneous but answers direct questions with short responses  Volume:  Decreased  Mood: Depressed  Affect:  Congruent  Thought Process:  Linear but concrete - no thought blocking today   Orientation:  to self, year, month and city and improved insight into reason for admission  Thought Content: Still with +AVH, paranoia (see notes)  Suicidal Thoughts:  No  Homicidal Thoughts:  No  Memory:   limited secondary to psychosis  Judgement:  Fair - compliant with meds and labs  Insight:  Shallow  Psychomotor Activity:  Normal, no akathisias or tremors noted - some stiffness in bilateral UE no cogwheeling  Concentration:  Concentration: Fair and Attention Span: Fair  Recall:   Limited secondary to psychosis  Fund of Knowledge:  Fair  Language:  Fair  Akathisia:  Negative  AIMS (if indicated):   0  Assets:  Resilience Social Support  ADL's:  Intact  Sleep: 7 hrs   Treatment Plan Summary: Diagnoses / Active Problems: Unspecified schizophrenia spectrum and other psychotic d/o (r/o schizophreniform d/o, r/o schizophrenia, r/o psychosis secondary to general medical condition)  PLAN: Safety and Monitoring:             -- Voluntary admission to inpatient psychiatric unit for safety, stabilization and treatment             -- Daily contact with patient to assess and evaluate symptoms and progress in treatment             -- Patient's case to be discussed in multi-disciplinary team meeting             -- Observation Level : q15 minute checks             -- Vital signs:  q12 hours             -- Precautions: suicide, elopement, and assault   2. Psychiatric Diagnoses and Treatment:  Unspecified schizophrenia spectrum and other psychotic d/o  -Discontinue 2 mg Risperdal M tabs in the mornings and continue  2 mg nightly for psychosis - goal of cross tapering onto monotherapy if tolerated - consider LAI if responds to this medication - Continue  Zyprexa Zydis 10 mg in the mornings and 20 mg qhs with goal of tapering to monotherapy. Pt showed some improvement on the Zyprexa last week as per his treatment team, and father also states that he looked better on this medication. Our plan is  to slowly decrease Risperdal and wean if off as pt shows improvement on Zyprexa. (Lipid panel WNL, A1c 5.1, QTc 374m and on repeat 8/2 3979m  -  Continue Cogentin 0.91m bid for EPS and monitor for constipation on med             -- Continue Vistaril 254mtid as needed for anxiety            -- Continue trazodone 1001mt night as needed for sleep   -- New onset psychosis w/u (Head CT noncontrast negative on 7/24; folate 13.5, B12 578, UDS negative, TSH 2.775, ETOH <10, Mag 2.0, CMP WNL other than anion gap 4, repeat CBC WNL, ESR 0, HIV nonreactive, RPR nonreactive, heavy metal WNL, ANA negative, Ceruloplasmin low at 13.8)  B1 pending               3.Medical Issues Being Addressed:   Leukopenia - resolved  -- Repeat WBC 6.0 and ANC 1800 on 8/5   Low Ceruloplasmin   -- Checking 24 hour urine copper if he will cooperate to provide sample and serum copper pending  Tachycardia -Continue Inderal 10 mg BID for tachycardia   Nasal congestion  - Continue flonase PRN and continue Claritin 31m42mily   Constipation  - Continue colace 100mg70mly and offer MOM  - Monitor while on Cogentin  4. Discharge Planning:   -- Social work and case management to assist with discharge planning and identification of hospital follow-up needs prior to discharge  -- Discharge Concerns: Need to establish a safety plan; Medication compliance and effectiveness  -- Discharge Goals: Return home with outpatient referrals for mental health follow-up including medication management/psychotherapy  Joseph Rough8/08/2022, 2:38 PM  Patient ID: TyreiVeverly Fellse   DOB: 10/1909/13/02y.27   MRN: [id number] ID: TyreiDEMARIS LEAVELLe   DOB: [birth year]80   MRN:

## 2022-07-10 NOTE — Progress Notes (Signed)
Pt denies SI/HI/VH but endorses AH and verbally agrees to approach staff if these become apparent or before harming themselves/others. Rates depression 0/10. Rates anxiety 0/10. Rates pain 0/10. Pt seen falling asleep in the dayroom. Pt seems to be lethargic. Pt has been in and out of his room throughout the day. Pt would look like he was anxious about something but would deny that there was anything wrong. Scheduled medications administered to pt, per MD orders. RN provided support and encouragement to pt. Q15 min safety checks implemented and continued. Pt safe on the unit. RN will continue to monitor and intervene as needed.   07/10/22 0817  Psych Admission Type (Psych Patients Only)  Admission Status Voluntary  Psychosocial Assessment  Patient Complaints Other (Comment) (AH)  Eye Contact Fair  Facial Expression Flat;Sad  Affect Anxious;Sad  Speech Soft  Interaction Forwards little;Minimal  Motor Activity Slow  Appearance/Hygiene Unremarkable  Behavior Characteristics Cooperative;Appropriate to situation;Calm  Mood Sad;Pleasant  Thought Process  Coherency WDL  Content Religiosity  Delusions None reported or observed  Perception Hallucinations  Hallucination Auditory  Judgment Impaired  Confusion None  Danger to Self  Current suicidal ideation? Denies  Danger to Others  Danger to Others None reported or observed  Danger to Others Abnormal  Harmful Behavior to others No threats or harm toward other people  Destructive Behavior No threats or harm toward property

## 2022-07-10 NOTE — Progress Notes (Signed)
D) Pt received calm, visible, participating in milieu, and in no acute distress. Pt A & O x4. Pt denies SI, HI, A/ V H, depression, anxiety and pain at this time. A) Pt encouraged to drink fluids. Pt encouraged to come to staff with needs. Pt encouraged to attend and participate in groups. Pt encouraged to set reachable goals.  R) Pt remained safe on unit, in no acute distress, will continue to assess.      07/10/22 2000  Psych Admission Type (Psych Patients Only)  Admission Status Voluntary  Psychosocial Assessment  Patient Complaints None  Eye Contact Fair  Facial Expression Flat  Affect Sad;Anxious  Speech Soft  Interaction Forwards little;Minimal  Motor Activity Slow  Appearance/Hygiene Unremarkable  Behavior Characteristics Calm;Cooperative  Mood Pleasant;Sad  Thought Process  Coherency Disorganized  Content Religiosity  Delusions Religious  Perception Hallucinations  Hallucination Auditory;Visual  Judgment Impaired  Confusion None  Danger to Self  Current suicidal ideation? Denies  Danger to Others  Danger to Others None reported or observed  Danger to Others Abnormal  Harmful Behavior to others No threats or harm toward other people  Destructive Behavior No threats or harm toward property

## 2022-07-10 NOTE — [facility] Group Notes (Signed)
Adult Psychoeducational Group Note  Date:  07/10/2022 Time:  9:40 AM  Group Topic/Focus:  Goals Group:   The focus of this group is to help patients establish daily goals to achieve during treatment and discuss how the patient can incorporate goal setting into their daily lives to aide in recovery.  Participation Level:  Minimal  Participation Quality:  Appropriate  Affect:  Appropriate  Cognitive:  Appropriate  Insight: Appropriate  Engagement in Group:  Engaged  Modes of Intervention:  Exploration  Additional Comments:    Donell Beers 07/10/2022, 9:40 AM

## 2022-07-10 NOTE — [facility] Group Notes (Signed)
The focus of this group is to help patients review their daily goal of treatment and discuss progress on daily workbooks. Pt was attentive and appropriate during tonight's wrap up group discussion. Pt was able to share that he was able to read a couple of chapters in his book. He was able to to go outside today and play basketball with peers. Overall day was decent and he will continue to believe in jesus.

## 2022-07-10 NOTE — Progress Notes (Signed)
Late entry  24 hour urine sent to lab collected from 6 pm to 6 am.

## 2022-07-11 DIAGNOSIS — F29 Unspecified psychosis not due to a substance or known physiological condition: Principal | ICD-10-CM

## 2022-07-11 LAB — COPPER, SERUM: Copper: 72 ug/dL (ref 63–121)

## 2022-07-11 NOTE — [facility] Group Notes (Signed)
Adult Psychoeducational Group Note  Date:  07/11/2022 Time:  8:26 PM  Group Topic/Focus:  Wrap-Up Group:   The focus of this group is to help patients review their daily goal of treatment and discuss progress on daily workbooks.  Participation Level:  Active  Participation Quality:  Appropriate and Attentive  Affect:  Appropriate  Cognitive:  Alert and Appropriate  Insight: Appropriate and Good  Engagement in Group:  Engaged  Modes of Intervention:  Discussion and Education  Additional Comments:  Pt attended and participated in wrap up group this evening and rated their day an 8/10. Pt states that they played basketball, read, rested and went outside. Pt states that they want to continue to get well in order to prepare for D/C and states that they are feeling much better since being on their medications. Pt would like to report that they are indeed ready for D/C.   Joseph Wilson 07/11/2022, 8:26 PM

## 2022-07-11 NOTE — Progress Notes (Signed)
Adult Psychoeducational Group Note  Date:  07/11/2022 Time:  7:36 PM  Group Topic/Focus:  Orientation:   The focus of this group is to educate the patient on the purpose and policies of crisis stabilization and provide a format to answer questions about their admission.  The group details unit policies and expectations of patients while admitted.  Participation Level:  Active  Participation Quality:  Appropriate  Affect:  Appropriate  Cognitive:  Appropriate  Insight: Appropriate  Engagement in Group:  Engaged  Modes of Intervention:  Discussion  Additional Comments:  Pt attended the orientation group and remained appropriate and engaged throughout the duration of the group.   Sheran Lawless 07/11/2022, 7:36 PM

## 2022-07-11 NOTE — Progress Notes (Signed)
[facility] MD Progress Note  07/11/2022 10:12 AM Joseph Wilson  MRN:  [id number]  Chief Complaint: psychosis  Reason for Admission:  Joseph Wilson is a 21 y.o., male with no past psychiatric history who presents to the [facility] from [facility] for evaluation and management of worsening psychosis and paranoia. According to outside records, the patient presented to the behavioral health unit for [place] via [place] police voluntarily as a walk-in with complaint paranoia, auditory and visual hallucinations.The patient is currently on Hospital Day 17.   Chart Review from last 24 hours: The patient's chart was reviewed and nursing notes were reviewed. The patient's case was discussed in multidisciplinary team meeting. V/S within the past 24 hrs have been WNL. As per documentation, he slept for 6.75 hrs last night. He remains compliant with all scheduled medications, and is attending some unit group sessions.   Information Obtained Today During Patient Interview: The patient was seen and assessed in his room on the 400 hall by this provider. Joseph Wilson says he has been in the hospital for about a week, but it has been over a week per his date of admission. Pt continues to present with a restricted affect/depressed mood.  His attention to personal hygiene and grooming remain fair, eye contact is good, speech is clear & coherent. Thought content are a bit disorganized as he tries to remember past events. Pt currently denies SI/HI, but endorsed feeling worried as to when to leave the hospital. There is however some improvement in his psychosis and delusions as compared to previous days.   During this assessment, Joseph Wilson, "I was brought to the hospital because I thought I was dealing with demons. I feel like I'm losing my mind sometimes & other times, I'm forgetful. I also think I'm getting better. I do feel sad as I'm trying to get over some issues I have been dealing  with all these times. I'm also anxious because I'm wondering when I can leave this place & go home. When asked if prior to coming to the hospital what could have caused his mental instability & if he was ever using any kind of drugs or substances? Joseph Wilson Wilson, "just the edibles". This was discussed with the attending psychiatrist who later reached out to patient's father to inquire about son's substance use if at all?. Patient's father apparently reported that patient spent all his time at home & has no way to access any edibles or drugs. We did inquire from the lab whether if a patient can test THC positive if they were consuming edibles. The lab staff replied, "yes & no as it depends on what/which edible the patient consumed". However, the what/which edibles could test positive on toxicology Wilson remain unknown at this time. Pt's psychotic symptoms are less in intensity as compared to previous days. He is more alert today compared to yesterday as patient presented a bit drowsy/tired. He is observed sitting up in the day room awake today. We discontinued his Risperdal in the mornings and continue the 2 mg nightly. We will continue Zyprexa 10 mg in the mornings and 20 mg nightly for management of pt's psychosis. We will continue other medications as listed below. Patient at this time is in no apparent distress. Reviewed vital signs, stable. Pulse rate is slightly elevated. Will recheck.  Principal Problem: Schizophrenia spectrum disorder with psychotic disorder type not yet determined ([place]) Diagnosis: Principal Problem:   Schizophrenia spectrum disorder with psychotic disorder type not yet  determined [facility])  Past Psychiatric History: see H&P  Past Medical History:  Past Medical History:  Diagnosis Date   Acid reflux    Family Psychiatric  History: Psychiatric illness: None reported Suicide: None reported Substance Abuse: None reported  Social History: see H&P  Current Medications: Current  Facility-Administered Medications  Medication Dose Route Frequency Provider Last Rate Last Admin   acetaminophen (TYLENOL) tablet 650 mg  650 mg Oral Q6H PRN Rankin, Shuvon B, NP   650 mg at 07/02/22 2052   alum & mag hydroxide-simeth (MAALOX/MYLANTA) 200-200-20 MG/5ML suspension 30 mL  30 mL Oral Q4H PRN Rankin, Shuvon B, NP       benztropine (COGENTIN) tablet 0.5 mg  0.5 mg Oral BID Nicholes Rough, NP   0.5 mg at 07/11/22 0811   diphenhydrAMINE (BENADRYL) capsule 50 mg  50 mg Oral Q6H PRN Harlow Asa, MD       Or   diphenhydrAMINE (BENADRYL) injection 50 mg  50 mg Intramuscular Q6H PRN Nelda Marseille, Amy E, MD       docusate sodium (COLACE) capsule 100 mg  100 mg Oral Daily Nelda Marseille, Amy E, MD   100 mg at 07/11/22 5929   feeding supplement (ENSURE ENLIVE / ENSURE PLUS) liquid 237 mL  237 mL Oral BID BM Massengill, Ovid Curd, MD   237 mL at 07/11/22 0814   fluticasone (FLONASE) 50 MCG/ACT nasal spray 1 spray  1 spray Each Nare Daily PRN Bobbitt, Shalon E, NP   1 spray at 07/08/22 2126   hydrOXYzine (ATARAX) tablet 25 mg  25 mg Oral TID PRN Dian Situ, MD   25 mg at 07/09/22 2120   loratadine (CLARITIN) tablet 10 mg  10 mg Oral Daily Winfred Leeds, Nadir, MD   10 mg at 07/11/22 0811   OLANZapine zydis (ZYPREXA) disintegrating tablet 5 mg  5 mg Oral Q8H PRN Harlow Asa, MD       And   LORazepam (ATIVAN) tablet 1 mg  1 mg Oral PRN Harlow Asa, MD       And   ziprasidone (GEODON) injection 20 mg  20 mg Intramuscular PRN Nelda Marseille, Amy E, MD       magnesium hydroxide (MILK OF MAGNESIA) suspension 30 mL  30 mL Oral Daily PRN Rankin, Shuvon B, NP   30 mL at 07/08/22 1653   OLANZapine zydis (ZYPREXA) disintegrating tablet 10 mg  10 mg Oral Daily Nkwenti, Doris, NP   10 mg at 07/11/22 2446   OLANZapine zydis (ZYPREXA) disintegrating tablet 20 mg  20 mg Oral QHS Nkwenti, Doris, NP   10 mg at 07/10/22 2104   propranolol (INDERAL) tablet 10 mg  10 mg Oral BID Nicholes Rough, NP   10 mg at 07/11/22  2863   risperiDONE (RISPERDAL M-TABS) disintegrating tablet 2 mg  2 mg Oral QHS Nkwenti, Doris, NP   2 mg at 07/10/22 2103   traZODone (DESYREL) tablet 100 mg  100 mg Oral QHS PRN Nicholes Rough, NP   100 mg at 07/10/22 2104   Lab Results:  No results found for this or any previous visit (from the past 48 hour(s)).  Blood Alcohol level:  Lab Results  Component Value Date   ETH <10 81/77/1165   Metabolic Disorder Labs: Lab Results  Component Value Date   HGBA1C 5.1 06/23/2022   MPG 99.67 06/23/2022   No results found for: "PROLACTIN" Lab Results  Component Value Date   CHOL 164 06/23/2022   TRIG 32 06/23/2022  HDL 63 06/23/2022   CHOLHDL 2.6 06/23/2022   VLDL 6 06/23/2022   LDLCALC 95 06/23/2022   Physical Findings: AIMS: Facial and Oral Movements Muscles of Facial Expression: None, normal Lips and Perioral Area: None, normal Jaw: None, normal Tongue: None, normal,Extremity Movements Upper (arms, wrists, hands, fingers): None, normal Lower (legs, knees, ankles, toes): None, normal, Trunk Movements Neck, shoulders, hips: None, normal, Overall Severity Severity of abnormal movements (highest score from questions above): None, normal Incapacitation due to abnormal movements: None, normal Patient's awareness of abnormal movements (rate only patient's report): No Awareness, Dental Status Current problems with teeth and/or dentures?: No Does patient usually wear dentures?: No   Musculoskeletal: Strength & Muscle Tone: within normal limits Gait & Station: normal Patient leans: N/A  Psychiatric Specialty Exam: Physical Exam Vitals and nursing note reviewed.  Constitutional:      Appearance: Normal appearance.  HENT:     Head: Normocephalic.  Pulmonary:     Effort: Pulmonary effort is normal.  Neurological:     General: No focal deficit present.     Mental Status: He is alert.    Review of Systems  Respiratory:  Negative for shortness of breath.   Cardiovascular:   Negative for chest pain.  Gastrointestinal:  Negative for constipation, diarrhea, nausea and vomiting.  Genitourinary:  Negative for difficulty urinating.  Neurological:  Negative for headaches.  Psychiatric/Behavioral:  Positive for hallucinations. Negative for agitation, behavioral problems, confusion, decreased concentration, dysphoric mood and self-injury. The patient is not nervous/anxious and is not hyperactive.     Blood pressure 107/73, pulse (!) 102, temperature 98 F (36.7 C), temperature source Oral, resp. rate 16, height _0  (1.905 m), weight 81.6 kg, SpO2 98 %.Body mass index is 22.5 kg/m.  General Appearance: Fairly Groomed and casually dressed  Eye Contact:  Fair  Speech: clear and coherent, normal fluency - non-spontaneous but answers direct questions with short responses  Volume:  Decreased  Mood: Depressed  Affect:  Congruent  Thought Process:  Linear but concrete - no thought blocking today  Orientation:  to self, year, month and city and improved insight into reason for admission  Thought Content: Still with +AVH, paranoia (see notes)  Suicidal Thoughts:  No  Homicidal Thoughts:  No  Memory:   limited secondary to psychosis  Judgement:  Fair - compliant with meds and labs  Insight:  Shallow  Psychomotor Activity:  Normal, no akathisias or tremors noted - some stiffness in bilateral UE no cogwheeling  Concentration:  Concentration: Fair and Attention Span: Fair  Recall:   Limited secondary to psychosis  Fund of Knowledge:  Fair  Language:  Fair  Akathisia:  Negative  AIMS (if indicated):   0  Assets:  Resilience Social Support  ADL's:  Intact  Sleep: 7 hrs   Treatment Plan Summary: Diagnoses / Active Problems: Unspecified schizophrenia spectrum and other psychotic d/o (r/o schizophreniform d/o, r/o schizophrenia, r/o psychosis secondary to general medical condition)  PLAN: Safety and Monitoring:             -- Voluntary admission to inpatient psychiatric  unit for safety, stabilization and treatment             -- Daily contact with patient to assess and evaluate symptoms and progress in treatment             -- Patient's case to be discussed in multi-disciplinary team meeting             -- Observation Level :  q15 minute checks             -- Vital signs:  q12 hours             -- Precautions: suicide, elopement, and assault   2. Psychiatric Diagnoses and Treatment:  Unspecified schizophrenia spectrum and other psychotic d/o  -Discontinue 2 mg Risperdal M tabs in the mornings.  -Continue  2 mg nightly for psychosis - goal of cross tapering onto monotherapy if tolerated - consider LAI if responds to this medication - Continue  Zyprexa Zydis 10 mg in the mornings for psychosis. -Continue Zyprexa zydis 20 mg po qhs with goal of tapering to monotherapy. Pt showed some improvement on the Zyprexa last week as per his treatment team, and father also states that he looked better on this medication. Our plan is to slowly decrease Risperdal and wean if off as pt shows improvement on Zyprexa. (Lipid panel WNL, A1c 5.1, QTc 33m and on repeat 8/2 3971m  - Continue Cogentin 0.5m53mid for EPS and monitor for constipation on med             -- Continue Vistaril 24m74md as needed for anxiety            -- Continue trazodone 100mg15mnight as needed for sleep   -- New onset psychosis w/u (Head CT noncontrast negative on 7/24; folate 13.5, B12 578, UDS negative, TSH 2.775, ETOH <10, Mag 2.0, CMP WNL other than anion gap 4, repeat CBC WNL, ESR 0, HIV nonreactive, RPR nonreactive, heavy metal WNL, ANA negative, Ceruloplasmin low at 13.8)  B1 pending               3.Medical Issues Being Addressed:   Leukopenia - resolved  -- Repeat WBC 6.0 and ANC 1800 on 8/5   Low Ceruloplasmin   -- Checking 24 hour urine copper if he will cooperate to provide sample and serum copper pending  Tachycardia -Continue Inderal 10 mg BID for tachycardia   Nasal congestion  -  Continue flonase PRN and continue Claritin 10mg 8my   Constipation  - Continue colace 100mg d48m and offer MOM  - Monitor while on Cogentin  4. Discharge Planning:   -- Social work and case management to assist with discharge planning and identification of hospital follow-up needs prior to discharge  -- Discharge Concerns: Need to establish a safety plan; Medication compliance and effectiveness  -- Discharge Goals: Return home with outpatient referrals for mental health follow-up including medication management/psychotherapy  Joseph Ploch NLindell Sparmhnp, fnp-bc. 07/11/2022, 10:12 AM  Patient ID: Joseph Wilson  DOB: [birth year] MRN: [id number] ID: Joseph Wilson  DOB: 11/19/210-22-2002o.[birth year] MRN: Patient ID: Joseph Wilson  DOB: [birth year] MRN: [id number]

## 2022-07-11 NOTE — Progress Notes (Signed)
Pt denies SI/HI/AH but endorses VH, "seeing mists" and verbally agrees to approach staff if these become apparent or before harming themselves/others. Rates depression 0/10. Rates anxiety 0/10. Rates pain 0/10. Pt has shown some improvement. Pt has not seemed as lethargic today as previously. Scheduled medications administered to pt, per MD orders. RN provided support and encouragement to pt. Q15 min safety checks implemented and continued. Pt safe on the unit. RN will continue to monitor and intervene as needed.   07/11/22 0811  Psych Admission Type (Psych Patients Only)  Admission Status Voluntary  Psychosocial Assessment  Patient Complaints Other (Comment) [facility])  Eye Contact Fair  Facial Expression Flat;Sad  Affect Sad;Flat  Speech Soft  Interaction Forwards little;Guarded;Minimal  Motor Activity Slow  Appearance/Hygiene Unremarkable  Behavior Characteristics Cooperative;Appropriate to situation;Calm  Mood Sad;Pleasant  Thought Process  Coherency Disorganized  Content Religiosity  Delusions None reported or observed  Perception Hallucinations  Hallucination Visual  Judgment Impaired  Confusion None  Danger to Self  Current suicidal ideation? Denies  Danger to Others  Danger to Others None reported or observed  Danger to Others Abnormal  Harmful Behavior to others No threats or harm toward other people  Destructive Behavior No threats or harm toward property

## 2022-07-12 NOTE — Progress Notes (Signed)
D) Pt received calm, visible, participating in milieu, and in no acute distress. Pt A & O x4. Pt denies SI, HI, VH, depression, anxiety and pain at this time. Pt endorses continued AH. A) Pt encouraged to drink fluids. Pt encouraged to come to staff with needs. Pt encouraged to attend and participate in groups. Pt encouraged to set reachable goals.  R) Pt remained safe on unit, in no acute distress, will continue to assess.      07/11/22 1930  Psych Admission Type (Psych Patients Only)  Admission Status Voluntary  Psychosocial Assessment  Patient Complaints Other (Comment) ([facility])  Eye Contact Fair  Facial Expression Flat  Affect Flat  Speech Soft  Interaction Forwards little  Motor Activity Slow  Appearance/Hygiene Unremarkable  Behavior Characteristics Cooperative;Appropriate to situation  Mood Pleasant  Thought Process  Coherency Disorganized  Content Religiosity  Delusions None reported or observed  Perception Hallucinations  Hallucination Visual  Judgment Impaired  Confusion None  Danger to Self  Current suicidal ideation? Denies  Danger to Others  Danger to Others None reported or observed  Danger to Others Abnormal  Harmful Behavior to others No threats or harm toward other people  Destructive Behavior No threats or harm toward property

## 2022-07-12 NOTE — [facility] Group Notes (Signed)
Adult Psychoeducational Group Note  Date:  07/12/2022 Time:  8:34 PM  Group Topic/Focus:  Wrap-Up Group:   The focus of this group is to help patients review their daily goal of treatment and discuss progress on daily workbooks.  Participation Level:  Active  Participation Quality:  Appropriate  Affect:  Appropriate  Cognitive:  Appropriate  Insight: Appropriate  Engagement in Group:  Supportive  Modes of Intervention:  Discussion  Additional Comments:  Pt rated day 8/10 due to him being able to go outside and feel the sun.   Joseph Wilson 07/12/2022, 8:34 PM

## 2022-07-12 NOTE — Progress Notes (Signed)
   07/12/22 2101  Psych Admission Type (Psych Patients Only)  Admission Status Voluntary  Psychosocial Assessment  Patient Complaints None  Eye Contact Fair  Facial Expression Animated  Affect Preoccupied  Speech Logical/coherent  Interaction Assertive  Motor Activity Slow  Appearance/Hygiene Unremarkable  Behavior Characteristics Cooperative  Mood Preoccupied  Thought Process  Coherency Disorganized;Blocking  Content WDL  Delusions None reported or observed  Perception Hallucinations  Hallucination None reported or observed  Judgment Impaired  Confusion None  Danger to Self  Current suicidal ideation? Denies  Danger to Others  Danger to Others None reported or observed  Danger to Others Abnormal  Harmful Behavior to others No threats or harm toward other people  Destructive Behavior No threats or harm toward property

## 2022-07-12 NOTE — Group Note (Signed)
LCSW Group Therapy Note   Group Date: 07/12/2022 Start Time: 1300 End Time: 1400  Type of Therapy and Topic:  Group Therapy:  Healthy and Unhealthy Supports   Participation Level:  Active    Description of Group:  Patients in this group were introduced to the idea of adding a variety of healthy supports to address the various needs in their lives. Patients discussed what additional healthy supports could be helpful in their recovery and wellness after discharge in order to prevent future hospitalizations.   An emphasis was placed on using counselor, doctor, therapy groups, 12-step groups, and problem-specific support groups to expand supports.  They also worked as a group on developing a specific plan for several patients to deal with unhealthy supports through boundary-setting, psychoeducation with loved ones, and even termination of relationships.   Therapeutic Goals:               1)  discuss importance of adding supports to stay well once out of the hospital             2)  compare healthy versus unhealthy supports and identify some examples of each             3)  generate ideas and descriptions of healthy supports that can be added             4)  offer mutual support about how to address unhealthy supports             5)  encourage active participation in and adherence to discharge plan               Summary of Patient Progress:  The Pt attended group and remained there the entire time.  The Pt accepted the worksheet and followed along throughout the session.  The Pt was able to identify supports, coping skills, and places where they feel safe in their home and community.  The Pt was appropriate with peers and participated in an open discussion.     Therapeutic Modalities:   Motivational Interviewing Brief Solution-Focused Therapy  Aram Beecham, Theresia Majors 07/12/2022  1:59 PM

## 2022-07-12 NOTE — Plan of Care (Addendum)
Patient is med compliant. Denies pain. Denies SI, HI ,and A/V/H with no plan/intent. Patient has been cooperative on unit although still appears to thought block at times. Patient stated he slept well last night (8.5 hours) and rated both his anxiety and depression a 4 out of 10 today. Patient also noted to be flirtatious at times with this RN asking questions such as "Do you find me attractive?" "Are you single?" Patient able to be redirected and has been overall cooperative.   Problem: Activity: Goal: Sleeping patterns will improve Outcome: Progressing   Problem: Health Behavior/Discharge Planning: Goal: Compliance with treatment plan for underlying cause of condition will improve Outcome: Progressing   Problem: Safety: Goal: Periods of time without injury will increase Outcome: Progressing

## 2022-07-12 NOTE — Progress Notes (Signed)
[facility] MD Progress Note  07/12/2022 9:02 AM Hilliard MARTE CELANI  MRN:  [id number]  Chief Complaint: psychosis  Reason for Admission:  Gurtaj LOCHLANN MASTRANGELO is a 21 y.o., male with no past psychiatric history who presents to the [facility] from [facility] for evaluation and management of worsening psychosis and paranoia. According to outside records, the patient presented to the behavioral health unit for [place] via [place] police voluntarily as a walk-in with complaint paranoia, auditory and visual hallucinations.The patient is currently on Hospital Day 18.   Chart Review from last 24 hours: The patient's chart was reviewed and nursing notes were reviewed. The patient's case was discussed in multidisciplinary team meeting. V/S within the past 24 hrs have been WNL. As per documentation, he slept for 6.75 hrs last night. He remains compliant with all scheduled medications, and is attending some unit group sessions.   Information Obtained Today During Patient Interview: The patient was seen and assessed in his room on the 400 hall by this provider. Aquan continues to present with a restricted affect/depressed mood.  His attention to personal hygiene and grooming remain fair, eye contact is fair, speech is clear & coherent. Thought content are a bit disorganized as he tries to remember past events. He seem to trying very hard today to organize his thoughts today. He is is answering the assessment questions using short answers. He presents sluggish, appears drowsy. Pt currently denies SI/HI, some improvement in his psychosis and delusions as compared to previous days.   During this assessment, Victorio reports, "I'm doing okay, just feeling sleepy. I'm feeling groggy today. But I feel a lot better because I'm not depressed or anxious. I still hear the voices from time to time, but none heard today. But I did hear a voice last night but unable to make out what the voice was  saying".  Discussed this case with the attending psychiatrist today. It is likely we will discontinue his Risperdal tomorrow morning to combat the daytime drowsiness. We will continue Zyprexa 10 mg in the mornings & 20 mg nightly for management of pt's psychosis. We will continue other medications as listed below. Patient at this time is in no apparent distress. Reviewed vital signs, stable.  Principal Problem: Schizophrenia spectrum disorder with psychotic disorder type not yet determined ([place]) Diagnosis: Principal Problem:   Schizophrenia spectrum disorder with psychotic disorder type not yet determined [facility])  Past Psychiatric History: see H&P  Past Medical History:  Past Medical History:  Diagnosis Date   Acid reflux    Family Psychiatric  History: Psychiatric illness: None reported Suicide: None reported Substance Abuse: None reported  Social History: see H&P  Current Medications: Current Facility-Administered Medications  Medication Dose Route Frequency Provider Last Rate Last Admin   acetaminophen (TYLENOL) tablet 650 mg  650 mg Oral Q6H PRN Rankin, Shuvon B, NP   650 mg at 07/02/22 2052   alum & mag hydroxide-simeth (MAALOX/MYLANTA) 200-200-20 MG/5ML suspension 30 mL  30 mL Oral Q4H PRN Rankin, Shuvon B, NP       benztropine (COGENTIN) tablet 0.5 mg  0.5 mg Oral BID Nkwenti, Doris, NP   0.5 mg at 07/11/22 2018   diphenhydrAMINE (BENADRYL) capsule 50 mg  50 mg Oral Q6H PRN Harlow Asa, MD       Or   diphenhydrAMINE (BENADRYL) injection 50 mg  50 mg Intramuscular Q6H PRN Nelda Marseille, Amy E, MD       docusate sodium (COLACE) capsule 100 mg  100 mg Oral Daily Nelda Marseille, Amy E, MD   100 mg at 07/11/22 8841   feeding supplement (ENSURE ENLIVE / ENSURE PLUS) liquid 237 mL  237 mL Oral BID BM Massengill, Ovid Curd, MD   237 mL at 07/11/22 1441   fluticasone (FLONASE) 50 MCG/ACT nasal spray 1 spray  1 spray Each Nare Daily PRN Bobbitt, Shalon E, NP   1 spray at 07/08/22 2126    hydrOXYzine (ATARAX) tablet 25 mg  25 mg Oral TID PRN Dian Situ, MD   25 mg at 07/09/22 2120   loratadine (CLARITIN) tablet 10 mg  10 mg Oral Daily Winfred Leeds, Nadir, MD   10 mg at 07/11/22 0811   OLANZapine zydis (ZYPREXA) disintegrating tablet 5 mg  5 mg Oral Q8H PRN Harlow Asa, MD       And   LORazepam (ATIVAN) tablet 1 mg  1 mg Oral PRN Harlow Asa, MD       And   ziprasidone (GEODON) injection 20 mg  20 mg Intramuscular PRN Nelda Marseille, Amy E, MD       magnesium hydroxide (MILK OF MAGNESIA) suspension 30 mL  30 mL Oral Daily PRN Rankin, Shuvon B, NP   30 mL at 07/08/22 1653   OLANZapine zydis (ZYPREXA) disintegrating tablet 10 mg  10 mg Oral Daily Nkwenti, Tamela Oddi, NP   10 mg at 07/11/22 6606   OLANZapine zydis (ZYPREXA) disintegrating tablet 20 mg  20 mg Oral QHS Nkwenti, Doris, NP   20 mg at 07/11/22 2018   propranolol (INDERAL) tablet 10 mg  10 mg Oral BID Nicholes Rough, NP   10 mg at 07/11/22 2018   risperiDONE (RISPERDAL M-TABS) disintegrating tablet 2 mg  2 mg Oral QHS Nkwenti, Doris, NP   2 mg at 07/11/22 2018   traZODone (DESYREL) tablet 100 mg  100 mg Oral QHS PRN Nicholes Rough, NP   100 mg at 07/10/22 2104   Lab Results:  No results found for this or any previous visit (from the past 40 hour(s)).  Blood Alcohol level:  Lab Results  Component Value Date   ETH <10 30/16/0109   Metabolic Disorder Labs: Lab Results  Component Value Date   HGBA1C 5.1 06/23/2022   MPG 99.67 06/23/2022   No results found for: "PROLACTIN" Lab Results  Component Value Date   CHOL 164 06/23/2022   TRIG 32 06/23/2022   HDL 63 06/23/2022   CHOLHDL 2.6 06/23/2022   VLDL 6 06/23/2022   LDLCALC 95 06/23/2022   Physical Findings: AIMS: Facial and Oral Movements Muscles of Facial Expression: None, normal Lips and Perioral Area: None, normal Jaw: None, normal Tongue: None, normal,Extremity Movements Upper (arms, wrists, hands, fingers): None, normal Lower (legs, knees, ankles,  toes): None, normal, Trunk Movements Neck, shoulders, hips: None, normal, Overall Severity Severity of abnormal movements (highest score from questions above): None, normal Incapacitation due to abnormal movements: None, normal Patient's awareness of abnormal movements (rate only patient's report): No Awareness, Dental Status Current problems with teeth and/or dentures?: No Does patient usually wear dentures?: No   Musculoskeletal: Strength & Muscle Tone: within normal limits Gait & Station: normal Patient leans: N/A  Psychiatric Specialty Exam: Physical Exam Vitals and nursing note reviewed.  Constitutional:      Appearance: Normal appearance.  HENT:     Head: Normocephalic.  Pulmonary:     Effort: Pulmonary effort is normal.  Neurological:     General: No focal deficit present.     Mental Status: He  is alert.     Review of Systems  Respiratory:  Negative for shortness of breath.   Cardiovascular:  Negative for chest pain.  Gastrointestinal:  Negative for constipation, diarrhea, nausea and vomiting.  Genitourinary:  Negative for difficulty urinating.  Neurological:  Negative for headaches.  Psychiatric/Behavioral:  Positive for hallucinations. Negative for agitation, behavioral problems, confusion, decreased concentration, dysphoric mood and self-injury. The patient is not nervous/anxious and is not hyperactive.     Blood pressure 112/72, pulse 92, temperature 97.8 F (36.6 C), temperature source Oral, resp. rate 18, height '6\' 3"'  (1.905 m), weight 81.6 kg, SpO2 99 %.Body mass index is 22.5 kg/m.  General Appearance: Fairly Groomed and casually dressed  Eye Contact:  Fair  Speech: clear and coherent, normal fluency - non-spontaneous but answers direct questions with short responses  Volume:  Decreased  Mood: Depressed  Affect:  Congruent  Thought Process:  Linear but concrete - no thought blocking today  Orientation:  to self, year, month and city and improved insight  into reason for admission  Thought Content: Still with +AVH, paranoia (see notes)  Suicidal Thoughts:  No  Homicidal Thoughts:  No  Memory:   limited secondary to psychosis  Judgement:  Fair - compliant with meds and labs  Insight:  Shallow  Psychomotor Activity:  Normal, no akathisias or tremors noted - some stiffness in bilateral UE no cogwheeling  Concentration:  Concentration: Fair and Attention Span: Fair  Recall:   Limited secondary to psychosis  Fund of Knowledge:  Fair  Language:  Fair  Akathisia:  Negative  AIMS (if indicated):   0  Assets:  Resilience Social Support  ADL's:  Intact  Sleep: 7 hrs   Treatment Plan Summary: Diagnoses / Active Problems: Unspecified schizophrenia spectrum and other psychotic d/o (r/o schizophreniform d/o, r/o schizophrenia, r/o psychosis secondary to general medical condition)  PLAN: Safety and Monitoring:             -- Voluntary admission to inpatient psychiatric unit for safety, stabilization and treatment             -- Daily contact with patient to assess and evaluate symptoms and progress in treatment             -- Patient's case to be discussed in multi-disciplinary team meeting             -- Observation Level : q15 minute checks             -- Vital signs:  q12 hours             -- Precautions: suicide, elopement, and assault   2. Psychiatric Diagnoses and Treatment:  Unspecified schizophrenia spectrum and other psychotic d/o  -Discontinue 2 mg Risperdal M tabs in the mornings.  -Continue  2 mg nightly for psychosis - goal of cross tapering onto monotherapy if tolerated - consider LAI if responds to this medication - Continue  Zyprexa Zydis 10 mg in the mornings for psychosis. -Continue Zyprexa zydis 20 mg po qhs with goal of tapering to monotherapy. Pt showed some improvement on the Zyprexa last week as per his treatment team, and father also states that he looked better on this medication. Our plan is to slowly decrease Risperdal  and wean if off as pt shows improvement on Zyprexa. (Lipid panel WNL, A1c 5.1, QTc 356m and on repeat 8/2 399m  - Continue Cogentin 0.43m87mid for EPS and monitor for constipation on med             --  Continue Vistaril 6m tid as needed for anxiety            -- Continue trazodone 1044mat night as needed for sleep   -- New onset psychosis w/u (Head CT noncontrast negative on 7/24; folate 13.5, B12 578, UDS negative, TSH 2.775, ETOH <10, Mag 2.0, CMP WNL other than anion gap 4, repeat CBC WNL, ESR 0, HIV nonreactive, RPR nonreactive, heavy metal WNL, ANA negative, Ceruloplasmin low at 13.8)  B1 pending               3.Medical Issues Being Addressed:   Leukopenia - resolved  -- Repeat WBC 6.0 and ANC 1800 on 8/5   Low Ceruloplasmin   -- Checking 24 hour urine copper if he will cooperate to provide sample and serum copper pending  Tachycardia -Continue Inderal 10 mg BID for tachycardia   Nasal congestion  - Continue flonase PRN and continue Claritin 108maily   Constipation  - Continue colace 100m77mily and offer MOM  - Monitor while on Cogentin  4. Discharge Planning:   -- Social work and case management to assist with discharge planning and identification of hospital follow-up needs prior to discharge  -- Discharge Concerns: Need to establish a safety plan; Medication compliance and effectiveness  -- Discharge Goals: Return home with outpatient referrals for mental health follow-up including medication management/psychotherapy  AgneLindell Spar, pmhnp, fnp-bc. 07/12/2022, 9:02 AM  Patient ID: TyreVeverly Fellsle   DOB: 11/110-06-2001 y3.   MRN: [id number] ID: TyreREED DADYle   DOB: 11/1Jul 14, 2002 y25.   MRN: Patient ID: TyreNAPHTALI ZYWICKIle   DOB: [birth year] y56.   MRN: [id number] ID: TyreKEROLOS NEHMEle   DOB: 11/1Oct 20, 2002 y9.   MRN: [id number]

## 2022-07-13 NOTE — Progress Notes (Signed)
[facility] MD Progress Note  07/13/2022 9:08 AM Joseph Wilson  MRN:  [id number]  Chief Complaint: psychosis  Reason for Admission:  Joseph Wilson is a 21 y.o., male with no past psychiatric history who presents to the [facility] from [facility] for evaluation and management of worsening psychosis and paranoia. According to outside records, the patient presented to the behavioral health unit for [place] via [place] police voluntarily as a walk-in with complaint paranoia, auditory and visual hallucinations.The patient is currently on Hospital Day 19.   Chart Review from last 24 hours: The patient's chart was reviewed and nursing notes were reviewed. The patient's case was discussed in multidisciplinary team meeting. V/S within the past 24 hrs have been WNL. As per documentation, he slept for 6.75 hrs last night. He remains compliant with all scheduled medications, and is attending some unit group sessions.   Information Obtained Today During Patient Interview: The patient was seen & assessed in his room on the 400 hall by this provider. Joseph Wilson continues to present with a restricted affect. His attention to personal hygiene/grooming remain fair. His eye contact is fair, speech is clear & coherent. Thought content are a bit disorganized as he tries to remember past events. He speaks softly & slow to respond to questions. He seems to be trying very hard to organize his thoughts today. He is is answering the assessment questions using short answers/phrases, but logical. He presents less sluggish/drowsy today. Pt currently denies any SI/HI, some improvement in his psychosis and delusions as compared to previous days.   During this assessment, Joseph Wilson reports, "I'm feeling better today. I just woke-up from sleep but I have good energy. I slept well last night. The grogginess is not as bad this morning. I have not heard any voices this morning, but I did last night telling  me to open the window & attack someone, but I did not listen to it".  Discussed this case with the attending psychiatrist today. The decision was made to discontinue bedtime Risperdal to combat the daytime drowsiness. We will continue Zyprexa 10 mg in the mornings & Zyprexa 20 mg nightly for management of pt's psychosis. We will continue the other medications as listed below. Patient at this time is in no apparent distress. Reviewed vital signs, stable.  Principal Problem: Schizophrenia spectrum disorder with psychotic disorder type not yet determined ([place]) Diagnosis: Principal Problem:   Schizophrenia spectrum disorder with psychotic disorder type not yet determined [facility])  Past Psychiatric History: see H&P  Past Medical History:  Past Medical History:  Diagnosis Date   Acid reflux    Family Psychiatric  History: Psychiatric illness: None reported Suicide: None reported Substance Abuse: None reported  Social History: see H&P  Current Medications: Current Facility-Administered Medications  Medication Dose Route Frequency Provider Last Rate Last Admin   acetaminophen (TYLENOL) tablet 650 mg  650 mg Oral Q6H PRN Rankin, Shuvon B, NP   650 mg at 07/02/22 2052   alum & mag hydroxide-simeth (MAALOX/MYLANTA) 200-200-20 MG/5ML suspension 30 mL  30 mL Oral Q4H PRN Rankin, Shuvon B, NP       benztropine (COGENTIN) tablet 0.5 mg  0.5 mg Oral BID Nkwenti, Doris, NP   0.5 mg at 07/13/22 0758   diphenhydrAMINE (BENADRYL) capsule 50 mg  50 mg Oral Q6H PRN Harlow Asa, MD       Or   diphenhydrAMINE (BENADRYL) injection 50 mg  50 mg Intramuscular Q6H PRN Harlow Asa, MD  docusate sodium (COLACE) capsule 100 mg  100 mg Oral Daily Nelda Marseille, Amy E, MD   100 mg at 07/13/22 0758   feeding supplement (ENSURE ENLIVE / ENSURE PLUS) liquid 237 mL  237 mL Oral BID BM Massengill, Nathan, MD   237 mL at 07/12/22 1600   fluticasone (FLONASE) 50 MCG/ACT nasal spray 1 spray  1 spray Each Nare Daily  PRN Bobbitt, Shalon E, NP   1 spray at 07/08/22 2126   hydrOXYzine (ATARAX) tablet 25 mg  25 mg Oral TID PRN Dian Situ, MD   25 mg at 07/09/22 2120   loratadine (CLARITIN) tablet 10 mg  10 mg Oral Daily Attiah, Nadir, MD   10 mg at 07/13/22 0758   OLANZapine zydis (ZYPREXA) disintegrating tablet 5 mg  5 mg Oral Q8H PRN Harlow Asa, MD       And   LORazepam (ATIVAN) tablet 1 mg  1 mg Oral PRN Harlow Asa, MD       And   ziprasidone (GEODON) injection 20 mg  20 mg Intramuscular PRN Nelda Marseille, Amy E, MD       magnesium hydroxide (MILK OF MAGNESIA) suspension 30 mL  30 mL Oral Daily PRN Rankin, Shuvon B, NP   30 mL at 07/08/22 1653   OLANZapine zydis (ZYPREXA) disintegrating tablet 10 mg  10 mg Oral Daily Nkwenti, Tamela Oddi, NP   10 mg at 07/13/22 0758   OLANZapine zydis (ZYPREXA) disintegrating tablet 20 mg  20 mg Oral QHS Nkwenti, Doris, NP   20 mg at 07/12/22 2039   propranolol (INDERAL) tablet 10 mg  10 mg Oral BID Nicholes Rough, NP   10 mg at 07/13/22 0758   risperiDONE (RISPERDAL M-TABS) disintegrating tablet 2 mg  2 mg Oral QHS Nkwenti, Doris, NP   2 mg at 07/12/22 2039   traZODone (DESYREL) tablet 100 mg  100 mg Oral QHS PRN Nicholes Rough, NP   100 mg at 07/10/22 2104   Lab Results:  No results found for this or any previous visit (from the past 32 hour(s)).  Blood Alcohol level:  Lab Results  Component Value Date   ETH <10 32/95/1884   Metabolic Disorder Labs: Lab Results  Component Value Date   HGBA1C 5.1 06/23/2022   MPG 99.67 06/23/2022   No results found for: "PROLACTIN" Lab Results  Component Value Date   CHOL 164 06/23/2022   TRIG 32 06/23/2022   HDL 63 06/23/2022   CHOLHDL 2.6 06/23/2022   VLDL 6 06/23/2022   LDLCALC 95 06/23/2022   Physical Findings: AIMS: Facial and Oral Movements Muscles of Facial Expression: None, normal Lips and Perioral Area: None, normal Jaw: None, normal Tongue: None, normal,Extremity Movements Upper (arms, wrists, hands,  fingers): None, normal Lower (legs, knees, ankles, toes): None, normal, Trunk Movements Neck, shoulders, hips: None, normal, Overall Severity Severity of abnormal movements (highest score from questions above): None, normal Incapacitation due to abnormal movements: None, normal Patient's awareness of abnormal movements (rate only patient's report): No Awareness, Dental Status Current problems with teeth and/or dentures?: No Does patient usually wear dentures?: No   Musculoskeletal: Strength & Muscle Tone: within normal limits Gait & Station: normal Patient leans: N/A  Psychiatric Specialty Exam: Physical Exam Vitals and nursing note reviewed.  Constitutional:      Appearance: Normal appearance.  HENT:     Head: Normocephalic.     Mouth/Throat:     Pharynx: Oropharynx is clear.  Cardiovascular:     Rate and Rhythm:  Normal rate.     Pulses: Normal pulses.  Pulmonary:     Effort: Pulmonary effort is normal.  Genitourinary:    Comments: Deferred Musculoskeletal:        General: Normal range of motion.     Cervical back: Normal range of motion.  Skin:    General: Skin is warm.  Neurological:     General: No focal deficit present.     Mental Status: He is alert and oriented to person, place, and time.     Review of Systems  Constitutional:  Negative for chills, diaphoresis and fever.  HENT:  Negative for congestion, rhinorrhea and sore throat.   Respiratory:  Negative for shortness of breath and wheezing.   Cardiovascular:  Negative for chest pain and palpitations.  Gastrointestinal:  Negative for constipation, diarrhea, nausea and vomiting.  Genitourinary:  Negative for difficulty urinating.  Allergic/Immunologic:       Allergies: Omeprazole.  Neurological:  Negative for dizziness, tremors, seizures, syncope, facial asymmetry, speech difficulty, weakness, light-headedness, numbness and headaches.  Psychiatric/Behavioral:  Positive for hallucinations. Negative for  agitation, behavioral problems, confusion, decreased concentration, dysphoric mood and self-injury. The patient is not nervous/anxious and is not hyperactive.     Blood pressure 117/71, pulse 84, temperature 98.3 F (36.8 C), temperature source Oral, resp. rate 18, height _0  (1.905 m), weight 81.6 kg, SpO2 98 %.Body mass index is 22.5 kg/m.  General Appearance: Fairly Groomed and casually dressed  Eye Contact:  Fair  Speech: clear and coherent, normal fluency - non-spontaneous but answers direct questions with short responses  Volume:  Decreased  Mood: Depressed  Affect:  Congruent  Thought Process:  Linear but concrete - no thought blocking today  Orientation:  to self, year, month and city and improved insight into reason for admission  Thought Content: Still with +AVH, paranoia (see notes)  Suicidal Thoughts:  No  Homicidal Thoughts:  No  Memory:   limited secondary to psychosis  Judgement:  Fair - compliant with meds and labs  Insight:  Shallow  Psychomotor Activity:  Normal, no akathisias or tremors noted - some stiffness in bilateral UE no cogwheeling  Concentration:  Concentration: Fair and Attention Span: Fair  Recall:   Limited secondary to psychosis  Fund of Knowledge:  Fair  Language:  Fair  Akathisia:  Negative  AIMS (if indicated):   0  Assets:  Resilience Social Support  ADL's:  Intact  Sleep: 7 hrs   Treatment Plan Summary: Diagnoses/Active Problems: Unspecified schizophrenia spectrum and other psychotic d/o (r/o schizophreniform d/o, r/o schizophrenia, r/o psychosis secondary to general medical condition)  PLAN: Safety and Monitoring:             -- Voluntary admission to inpatient psychiatric unit for safety, stabilization and treatment             -- Daily contact with patient to assess and evaluate symptoms and progress in treatment             -- Patient's case to be discussed in multi-disciplinary team meeting             -- Observation Level : q15  minute checks             -- Vital signs:  q12 hours             -- Precautions: suicide, elopement, and assault   2. Psychiatric Diagnoses and Treatment:  Unspecified schizophrenia spectrum and other psychotic d/o  -Discontinue 2 mg Risperdal  M tabs in the mornings.  -Discontinued  2 mg nightly for psychosis. -Continue  Zyprexa Zydis 10 mg in the mornings for psychosis. -Continue Zyprexa zydis 20 mg po qhs for psychosis. Pt is showing some improvement on the Zyprexa as per his treatment team & father also states that he looked better on this medication.   (Lipid panel WNL, A1c 5.1, QTc 348m and on repeat 8/2 3938m  - Continue Cogentin 0.17m717mid for EPS and monitor for constipation on med             -- Continue Vistaril 217m41md as needed for anxiety            -- Continue trazodone 100mg617mnight as needed for sleep   -- New onset psychosis w/u (Head CT noncontrast negative on 7/24; folate 13.5, B12 578, UDS negative, TSH 2.775, ETOH <10, Mag 2.0, CMP WNL other than anion gap 4, repeat CBC WNL, ESR 0, HIV nonreactive, RPR nonreactive, heavy metal WNL, ANA negative, Ceruloplasmin low at 13.8)  B1 pending               3.Medical Issues Being Addressed:   Leukopenia - resolved  -- Repeat WBC 6.0 and ANC 1800 on 8/5   Low Ceruloplasmin   -- Checking 24 hour urine copper if he will cooperate to provide sample and serum copper pending              Tachycardia              - Continue Inderal 10 mg BID for tachycardia   Nasal congestion  - Continue flonase PRN and continue Claritin 10mg 31my   Constipation  - Continue colace 100mg d43m and offer MOM  - Monitor while on Cogentin  4. Discharge Planning:   -- Social work and case management to assist with discharge planning and identification of hospital follow-up needs prior to discharge  -- Discharge Concerns: Need to establish a safety plan; Medication compliance and effectiveness  -- Discharge Goals: Return home with outpatient  referrals for mental health follow-up including medication management/psychotherapy  Joseph Wilson NLindell Sparmhnp, fnp-bc. 07/13/2022, 9:08 AM  Patient ID: Joseph Wilson  DOB: 11/19/226-Dec-2002o.54 MRN: [id number] ID: Joseph Wilson  DOB: 11/19/204-Jul-2002o.63 MRN: Patient ID: Joseph Wilson  DOB: 11/19/210-20-2002o.108 MRN: [id number] ID: Joseph Wilson  DOB: [birth year] MRN: [id number] ID: Joseph Wilson  DOB: 11/19/2Jul 05, 2002o.32 MRN: [id number]

## 2022-07-13 NOTE — Group Note (Signed)
  [facility] LCSW Group Therapy Note  Date/Time:  07/13/2022 11:15AM-12:00PM  Type of Therapy and Topic:  Group Therapy:  Feelings About Hospitalization  Participation Level:  Minimal   Description of Group This process group involved patients discussing their feelings related to being hospitalized, as well as the benefits they see to being in the hospital.  These feelings and benefits were itemized.  The group then brainstormed specific ways in which they could seek those same benefits when they discharge and return home.  Therapeutic Goals Patient will identify and describe positive and negative feelings related to hospitalization Patient will verbalize benefits of hospitalization to themselves personally Patients will brainstorm together ways they can obtain similar benefits in the outpatient setting, identify barriers to wellness and possible solutions  Summary of Patient Progress:  The patient expressed his primary feelings about being hospitalized are "I'm feeling hasty to leave" but also "I'm grateful and glad for the care and the medicines that have been provided.  He talked about wanting to be outdoors.  To stay well at discharge, he intends to stick with a routine and intends to keep himself busy.  Therapeutic Modalities Cognitive Behavioral Therapy Motivational Interviewing    Joseph Mantle, LCSW 07/13/2022, 2:59 PM

## 2022-07-13 NOTE — Plan of Care (Signed)
Patient has stayed in the milieu and attending group activities. Alert and oriented x 4. Admits that he has problem with thought process that started around age 21. He reports no major issues at home. Reports he was not able to complete  high school secondary to his mental health instability but willing to complete his GED. He denies thoughts of self-harm. Taking medications and eating well. Patient reported that he does not want to share his private trauma experience with nurse. He only shared that he  recently broke up with his girlfriend.  He is willing to share his life experience with MD and therapist.

## 2022-07-13 NOTE — Progress Notes (Signed)
Patient did not attend morning orientation group.  

## 2022-07-13 NOTE — [facility] Group Notes (Signed)
Patient did not attend morning orientation group.  

## 2022-07-13 NOTE — Progress Notes (Incomplete)
D- Patient alert and oriented x4. Patient in stable mood. Verbalized some AH but stated the voices are not as bad, he is able to block them. Pt stated he doesn't ever think the voices will stop.  Denies SI, HI, VH. Pt interacting positively with staff and peers.    A- Scheduled medications administered to patient, per MD orders.Routine safety checks conducted every 15 minutes.  Patient informed to notify staff with problems or concerns.   R- Patient compliant with medications and verbalized understanding treatment plan. Patient receptive, calm, and cooperative.     07/13/22 2035  Psych Admission Type (Psych Patients Only)  Admission Status Voluntary  Psychosocial Assessment  Patient Complaints None  Eye Contact Fair  Facial Expression Animated  Affect Preoccupied  Speech Logical/coherent  Interaction Assertive  Motor Activity Other (Comment) (WDL)  Appearance/Hygiene Unremarkable  Behavior Characteristics Cooperative  Mood Preoccupied  Thought Process  Coherency Blocking  Content WDL  Delusions None reported or observed  Perception Hallucinations  Hallucination Auditory  Judgment Impaired  Confusion None  Danger to Self  Current suicidal ideation? Denies  Danger to Others  Danger to Others None reported or observed  Danger to Others Abnormal  Harmful Behavior to others No threats or harm toward other people   *Admission status is Involuntary

## 2022-07-13 NOTE — [facility] Group Notes (Signed)
Adult Psychoeducational Group Note  Date:  07/13/2022 Time:  9:18 PM  Group Topic/Focus:  Wellness Toolbox:   The focus of this group is to discuss various aspects of wellness, balancing those aspects and exploring ways to increase the ability to experience wellness.  Patients will create a wellness toolbox for use upon discharge.  Participation Level:  Active  Participation Quality:  Attentive  Affect:  Appropriate  Cognitive:  Alert  Insight: Appropriate  Engagement in Group:  Engaged  Modes of Intervention:  Activity  Additional Comments:  Patient attended and participated in the therapeutic group activity.  Jearl Klinefelter 07/13/2022, 9:18 PM

## 2022-07-14 NOTE — Progress Notes (Signed)
[facility] MD Progress Note  07/14/2022 9:03 AM Joseph Wilson  MRN:  [id number]  Chief Complaint: psychosis  Reason for Admission:  Joseph Wilson is a 21 y.o., male with no past psychiatric history who presents to the [facility] from [facility] for evaluation and management of worsening psychosis and paranoia. According to outside records, the patient presented to the behavioral health unit for [place] via [place] police voluntarily as a walk-in with complaint paranoia, auditory and visual hallucinations.The patient is currently on Hospital Day 20.   Chart Review from last 24 hours: The patient's chart was reviewed and nursing notes were reviewed. The patient's case was discussed in multidisciplinary team meeting. V/S within the past 24 hrs have been WNL. As per staff reports, patient is sleeping well at night. He remains compliant with all scheduled medications, and is attending some unit group sessions.   Information Obtained Today During Patient Interview: The patient was seen & assessed in his room on the 400 hall by this provider. Moishe presents with an improved/reactive affect. His attention to personal hygiene/grooming remain fair. His eye contact is fair/improving. His speech is clear & coherent. Thought content are more organized today. He speaks softly & responds adequately to questions. He is is answering the assessment questions using short answers/phrases, but logical. He presents less sluggish/drowsy today. Pt currently denies any SI/HI, psychosis /delusions has improved remarkably as compared to previous days.   During this assessment, Journey reports, "I'm doing good. I slept well last night, had a good rest. He says the demons are not as prominent as they were previously. Denies any grogginess this morning. We discontinue the bedtime Risperdal to combat the daytime drowsiness. Tellis is showing more alertness today, even smiled during this  evaluation. We will continue Zyprexa 10 mg in the mornings & Zyprexa 20 mg nightly for continued management of pt's residual psychosis. We will continue the other medications as listed below. Patient at this time is in no apparent distress. Reviewed vital signs, stable. He may be discharged in am if he continues to do well. Patient's father has been in contact with the attending psychiatrist about his son's care/progress & he is involved in the decisions about his care.  Principal Problem: Schizophrenia spectrum disorder with psychotic disorder type not yet determined ([place]) Diagnosis: Principal Problem:   Schizophrenia spectrum disorder with psychotic disorder type not yet determined [facility])  Past Psychiatric History: see H&P  Past Medical History:  Past Medical History:  Diagnosis Date   Acid reflux    Family Psychiatric  History: Psychiatric illness: None reported Suicide: None reported Substance Abuse: None reported  Social History: see H&P  Current Medications: Current Facility-Administered Medications  Medication Dose Route Frequency Provider Last Rate Last Admin   acetaminophen (TYLENOL) tablet 650 mg  650 mg Oral Q6H PRN Rankin, Shuvon B, NP   650 mg at 07/02/22 2052   alum & mag hydroxide-simeth (MAALOX/MYLANTA) 200-200-20 MG/5ML suspension 30 mL  30 mL Oral Q4H PRN Rankin, Shuvon B, NP       benztropine (COGENTIN) tablet 0.5 mg  0.5 mg Oral BID Nkwenti, Doris, NP   0.5 mg at 07/14/22 0736   diphenhydrAMINE (BENADRYL) capsule 50 mg  50 mg Oral Q6H PRN Harlow Asa, MD       Or   diphenhydrAMINE (BENADRYL) injection 50 mg  50 mg Intramuscular Q6H PRN Nelda Marseille, Amy E, MD       docusate sodium (COLACE) capsule 100 mg  100 mg Oral Daily Nelda Marseille, Amy E, MD   100 mg at 07/14/22 0736   feeding supplement (ENSURE ENLIVE / ENSURE PLUS) liquid 237 mL  237 mL Oral BID BM Massengill, Nathan, MD   237 mL at 07/12/22 1600   fluticasone (FLONASE) 50 MCG/ACT nasal spray 1 spray  1 spray  Each Nare Daily PRN Bobbitt, Shalon E, NP   1 spray at 07/08/22 2126   hydrOXYzine (ATARAX) tablet 25 mg  25 mg Oral TID PRN Dian Situ, MD   25 mg at 07/13/22 2036   loratadine (CLARITIN) tablet 10 mg  10 mg Oral Daily Attiah, Nadir, MD   10 mg at 07/14/22 0736   OLANZapine zydis (ZYPREXA) disintegrating tablet 5 mg  5 mg Oral Q8H PRN Harlow Asa, MD       And   LORazepam (ATIVAN) tablet 1 mg  1 mg Oral PRN Harlow Asa, MD       And   ziprasidone (GEODON) injection 20 mg  20 mg Intramuscular PRN Nelda Marseille, Amy E, MD       magnesium hydroxide (MILK OF MAGNESIA) suspension 30 mL  30 mL Oral Daily PRN Rankin, Shuvon B, NP   30 mL at 07/08/22 1653   OLANZapine zydis (ZYPREXA) disintegrating tablet 10 mg  10 mg Oral Daily Nkwenti, Doris, NP   10 mg at 07/14/22 0736   OLANZapine zydis (ZYPREXA) disintegrating tablet 20 mg  20 mg Oral QHS Nkwenti, Doris, NP   20 mg at 07/13/22 2035   propranolol (INDERAL) tablet 10 mg  10 mg Oral BID Nicholes Rough, NP   10 mg at 07/14/22 0736   traZODone (DESYREL) tablet 100 mg  100 mg Oral QHS PRN Nicholes Rough, NP   100 mg at 07/13/22 2035   Lab Results:  No results found for this or any previous visit (from the past 29 hour(s)).  Blood Alcohol level:  Lab Results  Component Value Date   ETH <10 16/09/9603   Metabolic Disorder Labs: Lab Results  Component Value Date   HGBA1C 5.1 06/23/2022   MPG 99.67 06/23/2022   No results found for: "PROLACTIN" Lab Results  Component Value Date   CHOL 164 06/23/2022   TRIG 32 06/23/2022   HDL 63 06/23/2022   CHOLHDL 2.6 06/23/2022   VLDL 6 06/23/2022   LDLCALC 95 06/23/2022   Physical Findings: AIMS: Facial and Oral Movements Muscles of Facial Expression: None, normal Lips and Perioral Area: None, normal Jaw: None, normal Tongue: None, normal,Extremity Movements Upper (arms, wrists, hands, fingers): None, normal Lower (legs, knees, ankles, toes): None, normal, Trunk Movements Neck,  shoulders, hips: None, normal, Overall Severity Severity of abnormal movements (highest score from questions above): None, normal Incapacitation due to abnormal movements: None, normal Patient's awareness of abnormal movements (rate only patient's report): No Awareness, Dental Status Current problems with teeth and/or dentures?: No Does patient usually wear dentures?: No   Musculoskeletal: Strength & Muscle Tone: within normal limits Gait & Station: normal Patient leans: N/A  Psychiatric Specialty Exam: Physical Exam Vitals and nursing note reviewed.  Constitutional:      Appearance: Normal appearance.  HENT:     Head: Normocephalic.     Mouth/Throat:     Pharynx: Oropharynx is clear.  Cardiovascular:     Rate and Rhythm: Normal rate.     Pulses: Normal pulses.  Pulmonary:     Effort: Pulmonary effort is normal.  Genitourinary:    Comments: Deferred Musculoskeletal:  General: Normal range of motion.     Cervical back: Normal range of motion.  Skin:    General: Skin is warm.  Neurological:     General: No focal deficit present.     Mental Status: He is alert and oriented to person, place, and time.     Review of Systems  Constitutional:  Negative for chills, diaphoresis and fever.  HENT:  Negative for congestion, rhinorrhea and sore throat.   Respiratory:  Negative for shortness of breath and wheezing.   Cardiovascular:  Negative for chest pain and palpitations.  Gastrointestinal:  Negative for constipation, diarrhea, nausea and vomiting.  Genitourinary:  Negative for difficulty urinating.  Allergic/Immunologic:       Allergies: Omeprazole.  Neurological:  Negative for dizziness, tremors, seizures, syncope, facial asymmetry, speech difficulty, weakness, light-headedness, numbness and headaches.  Psychiatric/Behavioral:  Positive for hallucinations. Negative for agitation, behavioral problems, confusion, decreased concentration, dysphoric mood and self-injury. The  patient is not nervous/anxious and is not hyperactive.     Blood pressure 121/78, pulse 65, temperature 97.8 F (36.6 C), temperature source Oral, resp. rate 16, height _0  (1.905 m), weight 81.6 kg, SpO2 98 %.Body mass index is 22.5 kg/m.  General Appearance: Fairly Groomed and casually dressed  Eye Contact:  Fair  Speech: clear and coherent, normal fluency - non-spontaneous but answers direct questions with short responses  Volume:  Decreased  Mood: Depressed  Affect:  Congruent  Thought Process:  Linear but concrete - no thought blocking today  Orientation:  to self, year, month and city and improved insight into reason for admission  Thought Content: Still with +AVH, paranoia (see notes)  Suicidal Thoughts:  No  Homicidal Thoughts:  No  Memory:   limited secondary to psychosis  Judgement:  Fair - compliant with meds and labs  Insight:  Shallow  Psychomotor Activity:  Normal, no akathisias or tremors noted - some stiffness in bilateral UE no cogwheeling  Concentration:  Concentration: Fair and Attention Span: Fair  Recall:   Limited secondary to psychosis  Fund of Knowledge:  Fair  Language:  Fair  Akathisia:  Negative  AIMS (if indicated):   0  Assets:  Resilience Social Support  ADL's:  Intact  Sleep: 7 hrs   Treatment Plan Summary: Diagnoses/Active Problems: Unspecified schizophrenia spectrum and other psychotic d/o (r/o schizophreniform d/o, r/o schizophrenia, r/o psychosis secondary to general medical condition)  PLAN: Safety and Monitoring:             -- Voluntary admission to inpatient psychiatric unit for safety, stabilization and treatment             -- Daily contact with patient to assess and evaluate symptoms and progress in treatment             -- Patient's case to be discussed in multi-disciplinary team meeting             -- Observation Level : q15 minute checks             -- Vital signs:  q12 hours             -- Precautions: suicide, elopement, and  assault   2. Psychiatric Diagnoses and Treatment:  Unspecified schizophrenia spectrum and other psychotic d/o  -Discontinue 2 mg Risperdal M tabs in the mornings.  -Discontinued  2 mg nightly for psychosis. -Continue  Zyprexa Zydis 10 mg in the mornings for psychosis. -Continue Zyprexa zydis 20 mg po qhs for psychosis. Pt is  showing some improvement on the Zyprexa as per his treatment team & father also states that he looked better on this medication.   (Lipid panel WNL, A1c 5.1, QTc 352m and on repeat 8/2 3957m  - Continue Cogentin 0.31m28mid for EPS and monitor for constipation on med             -- Continue Vistaril 231m47md as needed for anxiety            -- Continue trazodone 100mg48mnight as needed for sleep   -- New onset psychosis w/u (Head CT noncontrast negative on 7/24; folate 13.5, B12 578, UDS negative, TSH 2.775, ETOH <10, Mag 2.0, CMP WNL other than anion gap 4, repeat CBC WNL, ESR 0, HIV nonreactive, RPR nonreactive, heavy metal WNL, ANA negative, Ceruloplasmin low at 13.8)  B1 pending               3.Medical Issues Being Addressed:   Leukopenia - resolved  -- Repeat WBC 6.0 and ANC 1800 on 8/5   Low Ceruloplasmin   -- Checking 24 hour urine copper if he will cooperate to provide sample and serum copper pending              Tachycardia              - Continue Inderal 10 mg BID for tachycardia   Nasal congestion  - Continue flonase PRN and continue Claritin 10mg 62my   Constipation  - Continue colace 100mg d53m and offer MOM  - Monitor while on Cogentin  4. Discharge Planning:   -- Social work and case management to assist with discharge planning and identification of hospital follow-up needs prior to discharge  -- Discharge Concerns: Need to establish a safety plan; Medication compliance and effectiveness  -- Discharge Goals: Return home with outpatient referrals for mental health follow-up including medication management/psychotherapy  Prentice Sackrider NLindell Sparpmhnp, fnp-bc. 07/14/2022, 9:03 AM  Patient ID: Dale Veverly Fells  DOB: [birth year] MRN: [id number] ID: Donnis TASHAWN LASWELL  DOB: [birth year] MRN: Patient ID: Rowland KAMAREON SCIANDRA  DOB: 11/19/209-10-2002o.[birth year] MRN: [id number] ID: Jaydien DACEN FRAYRE  DOB: [birth year] MRN: [id number] ID: Birney LINKIN VIZZINI  DOB: 11/19/205/08/2002o.[birth year] MRN: [id number] ID: Karo DEREKE NEUMANN  DOB: 11/19/202-09-2002o.[birth year] MRN: [id number]

## 2022-07-14 NOTE — [facility] Group Notes (Signed)
Patient attended wrap up group with this Clinical research associate and was able to verbalize his reason for being at [facility] and what he wants to work on.  Patient was appropriate.

## 2022-07-14 NOTE — Group Note (Signed)
LCSW Group Therapy Note  07/14/2022      Type of Therapy and Topic:  Group Therapy: Gratitude  Participation Level:  Active   Description of Group:   In this group, patients shared and discussed the importance of acknowledging the elements in their lives for which they are grateful and how this can positively impact their mood.  The group discussed how bringing the positive elements of their lives to the forefront of their minds can help with recovery from any illness, physical or mental.  An exercise was done as a group in which a list was made of gratitude items in order to encourage participants to consider other potential positives in their lives.  Therapeutic Goals: Patients will identify one or more item for which they are grateful in each of 6 categories:  people, experience, thing, place, skill, and other. Patients will discuss how it is possible to seek out gratitude in even bad situations. Patients will explore other possible items of gratitude that they could remember.   Summary of Patient Progress:  The patient shared that he is grateful for his shoes, "[place] slides."  He only spoke up with answers when called on directly, but at those times was still attentive and answered appropriately and promptly.   Therapeutic Modalities:   Solution-Focused Therapy Activity  Carloyn Jaeger Grossman-Orr, LCSW .

## 2022-07-14 NOTE — [facility] Group Notes (Signed)
PsychoEducational Group Note. Patients were given interactive group with mental health comparison to a car maintenance.  Pts were given a toy car and were asked to explore what helps a car run, using fuel to help car run, as compared to how we need sleep, appropriate supports, healthy food and coping skills to support mental health maintenance. Pts were asked to identify one healthy coping mechanism that helps support their mental health and identify a trigger of when their '' check engine light was on '' prior to coming to the hospital. Pt shared that he recognized he started to worry a lot prior to coming in the hospital.

## 2022-07-14 NOTE — Progress Notes (Signed)
Pt presents with pleasant mood, affect blunted. Joseph Wilson presents slightly cautious, guarded but states that he is doing well. He reports he is improved, and denies any auditory hallucinations, and specifically asked if pt hearing demons and he denies. He states he is sleeping and eating well and denies any acute concerns. Patient denies any SI or HI. No signs pt is responding to internal stimuli but he appears guarded, although improved from admission. Pt compliant with am medications. Encouraged pt to complete self inventory. No further voiced concerns at this time.

## 2022-07-14 NOTE — Progress Notes (Signed)
Adult Psychoeducational Group Note  Date:  07/14/2022 Time:  9:36 AM  Group Topic/Focus:  Goals Group:   The focus of this group is to help patients establish daily goals to achieve during treatment and discuss how the patient can incorporate goal setting into their daily lives to aide in recovery.  Participation Level:  Active  Participation Quality:  Appropriate  Affect:  Appropriate  Cognitive:  Appropriate  Insight: Appropriate  Engagement in Group:  Engaged  Modes of Intervention:  Discussion  Additional Comments:  The patient attended group and expressed that his goal is to exercise more.  Octavio Manns 07/14/2022, 9:36 AM

## 2022-07-15 ENCOUNTER — Encounter (HOSPITAL_COMMUNITY): Payer: Self-pay

## 2022-07-15 MED ORDER — HALOPERIDOL 2 MG PO TABS
2.0000 mg | ORAL_TABLET | Freq: Two times a day (BID) | ORAL | Status: DC
  Administered 2022-07-15 – 2022-07-17 (×4): 2 mg via ORAL
  Filled 2022-07-15 (×9): qty 1

## 2022-07-15 NOTE — Progress Notes (Signed)
   07/14/22 2200  Psych Admission Type (Psych Patients Only)  Admission Status Involuntary  Psychosocial Assessment  Patient Complaints None  Eye Contact Fair  Facial Expression Animated  Affect Blunted  Speech Logical/coherent  Interaction Assertive  Motor Activity Slow  Appearance/Hygiene Unremarkable  Behavior Characteristics Cooperative;Appropriate to situation  Mood Pleasant  Thought Process  Coherency WDL  Content WDL  Delusions None reported or observed  Perception WDL  Hallucination None reported or observed  Judgment Poor  Confusion None  Danger to Self  Current suicidal ideation? Denies  Danger to Others  Danger to Others None reported or observed  Danger to Others Abnormal  Harmful Behavior to others No threats or harm toward other people

## 2022-07-15 NOTE — Progress Notes (Signed)
Pt endorses auditory hallucinations this morning. Pt reports voices are telling him not to shower or perform self care. Pt remains medication compliant. Pt denies suicidal as well as homicidal thoughts and thoughts of self harm. Pt participating in recreational time this afternoon with peers and staff. Will monitor and assist with treatment.

## 2022-07-15 NOTE — Progress Notes (Signed)
The focus of this group is to help patients review their daily goal of treatment and discuss progress on daily workbooks.  Pt attended the evening group and responded to all discussion prompts from the Writer. Pt shared that today was a good day on the unit, the highlight of which was enjoying being with his peers on the hall. "I had a lot of fun playing basketball with everybody in the gym."  Pt told that his goal this week was to be more self-aware of how he's presenting himself to others. "I think sometimes I might be giving people the wrong idea."  Pt rated his day a 6 out of 10 and his affect was appropriate.

## 2022-07-15 NOTE — Progress Notes (Signed)
   Pt's mother, Mohamedamin Nifong called requesting an update on pt.  Writer advised that pt was a little anxious this evening and endorsed hearing "whispers." Advised that pt does keep to himself, but attends group and took his medications with no problem.  Pt's mother expressed that she was expecting to get a call that he would be coming home this evening, but she was told pt complained of hallucinations.  Writer advised pt's mother to follow up with doctor tomorrow for next steps, after they have had the opportunity to assess the pt.  Provided mother with reassurance and ended call.

## 2022-07-15 NOTE — Group Note (Signed)
Date:  07/15/2022 Time:  10:32 AM  Group Topic/Focus:  Goals Group:   The focus of this group is to help patients establish daily goals to achieve during treatment and discuss how the patient can incorporate goal setting into their daily lives to aide in recovery.    Participation Level:  Minimal  Participation Quality:  Appropriate  Affect:  Flat  Cognitive:  Oriented  Insight: Improving  Engagement in Group:  Engaged  Modes of Intervention:  Discussion  Additional Comments:  Patient was fairly quiet during group but interacted and shared when asked. Patient stated his goal was to "be more self aware of my body posture". Patient received support and encouragement from staff and fellow patients.  Joseph Wilson 07/15/2022, 10:32 AM

## 2022-07-15 NOTE — BH IP Treatment Plan (Signed)
Interdisciplinary Treatment and Diagnostic Plan Update  07/15/2022 Time of Session: 0830 Joseph Wilson MRN: [id number]  Principal Diagnosis: Psychosis [facility])  Secondary Diagnoses: Principal Problem:   Psychosis (HCC)   Current Medications:  Current Facility-Administered Medications  Medication Dose Route Frequency Provider Last Rate Last Admin   acetaminophen (TYLENOL) tablet 650 mg  650 mg Oral Q6H PRN Rankin, Shuvon B, NP   650 mg at 07/02/22 2052   alum & mag hydroxide-simeth (MAALOX/MYLANTA) 200-200-20 MG/5ML suspension 30 mL  30 mL Oral Q4H PRN Rankin, Shuvon B, NP       benztropine (COGENTIN) tablet 0.5 mg  0.5 mg Oral BID Nkwenti, Doris, NP   0.5 mg at 07/15/22 0800   diphenhydrAMINE (BENADRYL) capsule 50 mg  50 mg Oral Q6H PRN Comer Locket, MD       Or   diphenhydrAMINE (BENADRYL) injection 50 mg  50 mg Intramuscular Q6H PRN Mason Jim, Amy E, MD       docusate sodium (COLACE) capsule 100 mg  100 mg Oral Daily Singleton, Amy E, MD   100 mg at 07/15/22 0800   feeding supplement (ENSURE ENLIVE / ENSURE PLUS) liquid 237 mL  237 mL Oral BID BM Massengill, Nathan, MD   237 mL at 07/15/22 1000   fluticasone (FLONASE) 50 MCG/ACT nasal spray 1 spray  1 spray Each Nare Daily PRN Bobbitt, Shalon E, NP   1 spray at 07/08/22 2126   haloperidol (HALDOL) tablet 2 mg  2 mg Oral BID Abbott Pao, Nadir, MD   2 mg at 07/15/22 1100   hydrOXYzine (ATARAX) tablet 25 mg  25 mg Oral TID PRN Sarita Bottom, MD   25 mg at 07/13/22 2036   loratadine (CLARITIN) tablet 10 mg  10 mg Oral Daily Attiah, Nadir, MD   10 mg at 07/15/22 0800   OLANZapine zydis (ZYPREXA) disintegrating tablet 5 mg  5 mg Oral Q8H PRN Comer Locket, MD       And   LORazepam (ATIVAN) tablet 1 mg  1 mg Oral PRN Comer Locket, MD       And   ziprasidone (GEODON) injection 20 mg  20 mg Intramuscular PRN Mason Jim, Amy E, MD       magnesium hydroxide (MILK OF MAGNESIA) suspension 30 mL  30 mL Oral Daily PRN Rankin, Shuvon B, NP   30  mL at 07/08/22 1653   OLANZapine zydis (ZYPREXA) disintegrating tablet 10 mg  10 mg Oral Daily Nkwenti, Doris, NP   10 mg at 07/15/22 0801   OLANZapine zydis (ZYPREXA) disintegrating tablet 20 mg  20 mg Oral QHS Nkwenti, Doris, NP   20 mg at 07/14/22 1958   propranolol (INDERAL) tablet 10 mg  10 mg Oral BID Starleen Blue, NP   10 mg at 07/15/22 0759   traZODone (DESYREL) tablet 100 mg  100 mg Oral QHS PRN Starleen Blue, NP   100 mg at 07/13/22 2035   PTA Medications: Medications Prior to Admission  Medication Sig Dispense Refill Last Dose   ASHWAGANDHA PO Take 1 capsule by mouth in the morning and at bedtime.      hydrOXYzine (ATARAX) 25 MG tablet Take 1 tablet (25 mg total) by mouth 3 (three) times daily as needed for anxiety. 30 tablet 0    Multiple Vitamins-Minerals (ADULT GUMMY PO) Take 1 tablet by mouth daily.      neomycin-bacitracin-polymyxin (NEOSPORIN) [phone] ointment Apply 1 Application topically daily as needed (Apply to affected area).  OLANZapine zydis (ZYPREXA) 5 MG disintegrating tablet Take 1 tablet (5 mg total) by mouth at bedtime.      traZODone (DESYREL) 50 MG tablet Take 1 tablet (50 mg total) by mouth at bedtime as needed for sleep.       Patient Stressors: Other: psychosis    Patient Strengths: Average or above average intelligence  Communication skills  Motivation for treatment/growth  Physical Health  Religious Affiliation  Supportive family/friends   Treatment Modalities: Medication Management, Group therapy, Case management,  1 to 1 session with clinician, Psychoeducation, Recreational therapy.   Physician Treatment Plan for Primary Diagnosis: Psychosis (HCC) Long Term Goal(s): Improvement in symptoms so as ready for discharge   Short Term Goals: Ability to identify changes in lifestyle to reduce recurrence of condition will improve Ability to verbalize feelings will improve Ability to disclose and discuss suicidal ideas Ability to demonstrate  self-control will improve  Medication Management: Evaluate patient's response, side effects, and tolerance of medication regimen.  Therapeutic Interventions: 1 to 1 sessions, Unit Group sessions and Medication administration.  Evaluation of Outcomes: Progressing  Physician Treatment Plan for Secondary Diagnosis: Principal Problem:   Psychosis (HCC)  Long Term Goal(s): Improvement in symptoms so as ready for discharge   Short Term Goals: Ability to identify changes in lifestyle to reduce recurrence of condition will improve Ability to verbalize feelings will improve Ability to disclose and discuss suicidal ideas Ability to demonstrate self-control will improve     Medication Management: Evaluate patient's response, side effects, and tolerance of medication regimen.  Therapeutic Interventions: 1 to 1 sessions, Unit Group sessions and Medication administration.  Evaluation of Outcomes: Progressing   RN Treatment Plan for Primary Diagnosis: Psychosis (HCC) Long Term Goal(s): Knowledge of disease and therapeutic regimen to maintain health will improve  Short Term Goals: Ability to remain free from injury will improve, Ability to verbalize frustration and anger appropriately will improve, Ability to demonstrate self-control, Ability to participate in decision making will improve, Ability to verbalize feelings will improve, Ability to disclose and discuss suicidal ideas, Ability to identify and develop effective coping behaviors will improve, and Compliance with prescribed medications will improve  Medication Management: RN will administer medications as ordered by provider, will assess and evaluate patient's response and provide education to patient for prescribed medication. RN will report any adverse and/or side effects to prescribing provider.  Therapeutic Interventions: 1 on 1 counseling sessions, Psychoeducation, Medication administration, Evaluate responses to treatment, Monitor vital  signs and CBGs as ordered, Perform/monitor CIWA, COWS, AIMS and Fall Risk screenings as ordered, Perform wound care treatments as ordered.  Evaluation of Outcomes: Progressing   LCSW Treatment Plan for Primary Diagnosis: Psychosis (HCC) Long Term Goal(s): Safe transition to appropriate next level of care at discharge, Engage patient in therapeutic group addressing interpersonal concerns.  Short Term Goals: Engage patient in aftercare planning with referrals and resources, Increase social support, Increase ability to appropriately verbalize feelings, Increase emotional regulation, Facilitate acceptance of mental health diagnosis and concerns, Facilitate patient progression through stages of change regarding substance use diagnoses and concerns, and Identify triggers associated with mental health/substance abuse issues  Therapeutic Interventions: Assess for all discharge needs, 1 to 1 time with Social worker, Explore available resources and support systems, Assess for adequacy in community support network, Educate family and significant other(s) on suicide prevention, Complete Psychosocial Assessment, Interpersonal group therapy.  Evaluation of Outcomes: Progressing   Progress in Treatment: Attending groups: Yes. Participating in groups: Yes. Taking medication as prescribed: Yes.  Toleration medication: Yes. Family/Significant other contact made: Yes, individual(s) contacted:  Altyreit (father) [phone] Patient understands diagnosis: Yes. Discussing patient identified problems/goals with staff: Yes. Medical problems stabilized or resolved: Yes. Denies suicidal/homicidal ideation: Yes. Issues/concerns per patient self-inventory: Yes. Other: none  New problem(s) identified: No, Describe:  none  New Short Term/Long Term Goal(s): Patient to work towards elimination of symptoms of psychosis, medication management for mood stabilization\; development of comprehensive mental wellness  plan.  Patient Goals:  No additional goals identified at this time. Patient to continue to work towards original goals identified in initial treatment team meeting. CSW will remain available to patient should they voice additional treatment goals.   Discharge Plan or Barriers: No psychosocial barriers identified at this time, patient to return to place of residence when appropriate for discharge.   Reason for Continuation of Hospitalization: Delusions   Estimated Length of Stay: 1-7 days    Scribe for Treatment Team: Almedia Balls 07/15/2022 11:21 AM

## 2022-07-15 NOTE — Group Note (Signed)
[facility] LCSW Group Therapy Note   Group Date: 07/15/2022 Start Time: 1300 End Time: 1400   Type of Therapy and Topic: Group Therapy: Avoiding Self-Sabotaging and Enabling Behaviors  Participation Level: Minimal  Mood: Withdrawn   Description of Group:  In this group, patients will learn how to identify obstacles, self-sabotaging and enabling behaviors, as well as: what are they, why do we do them and what needs these behaviors meet. Discuss unhealthy relationships and how to have positive healthy boundaries with those that sabotage and enable. Explore aspects of self-sabotage and enabling in yourself and how to limit these self-destructive behaviors in everyday life.   Therapeutic Goals: 1. Patient will identify one obstacle that relates to self-sabotage and enabling behaviors 2. Patient will identify one personal self-sabotaging or enabling behavior they did prior to admission 3. Patient will state a plan to change the above identified behavior 4. Patient will demonstrate ability to communicate their needs through discussion and/or role play.    Summary of Patient Progress:   Patient was present for the entirety of group session. Patient participated in opening and closing remarks. However, patient did not contribute at all to the topic of discussion despite encouraged participation.    Therapeutic Modalities:  Cognitive Behavioral Therapy Person-Centered Therapy Motivational Interviewing    Corky Crafts, [place]

## 2022-07-15 NOTE — Progress Notes (Signed)
[facility] MD Progress Note  07/15/2022 12:28 PM Daryon JACOBI NILE  MRN:  [id number]   Reason for Admission:  Joseph Wilson is a 21 y.o., male with no past psychiatric history who presents to the [facility] from [facility] for evaluation and management of worsening psychosis and paranoia. According to outside records, the patient presented to the behavioral health unit for [place] via [place] police voluntarily as a walk-in with complaint paranoia, auditory and visual hallucinations.The patient is currently on Hospital Day 21.   Chart Review from last 24 hours:  The patient's chart was reviewed and nursing notes were reviewed. The patient's case was discussed in multidisciplinary team meeting. Per [facility] he is compliant with scheduled Zyprexa 10 mg in the morning and 20 mg at bedtime as well as a scheduled Cogentin twice daily, Inderal twice daily, no as needed medication were used or needed for agitation or aggression, as needed trazodone being used intermittently during hospital stay for sleep.  Staff report patient reported auditory and visual hallucination yesterday late afternoon and evening seeing demons and hearing voices telling him not to shower.  Information Obtained Today During Patient Interview: The patient was seen and evaluated on the unit. On assessment today the patient presents with some worsening thought blocking since yesterday morning, he does admit to seeing demons yesterday and again earlier this morning, he also reports hearing voices last night telling him not to shower, with further questioning he does admit to hearing voices to harm himself yesterday evening but does not elaborate, he seems today to be struggling to organize his thoughts and when I asked him he responds "something is disturbing my thoughts I just feel I cannot think" Secondary to worsening psychosis noted since yesterday afternoon I did contact patient's father/mother who seem to  be very supportive and have been making adjustment to the reschedule to accommodate him after discharge, I discussed the patient's worsening since yesterday and will cancel discharge planning, I also addressed adding Haldol 2 mg twice daily to current dose of Zyprexa to address ongoing psychosis, they agree with the plan at this time. No sign of EPS noted upon eval today, patient denies side effect to medications, does not appear sleepy or drowsy.  Sleep  Sleep: Fair  Principal Problem: Psychosis (HCC) Diagnosis: Principal Problem:   Psychosis (HCC)    Past Psychiatric History: See H&P  Past Medical History:  Past Medical History:  Diagnosis Date   Acid reflux    History reviewed. No pertinent surgical history. Family History: History reviewed. No pertinent family history. Family Psychiatric  History: See H&P Social History: See H&P  Current Medications: Current Facility-Administered Medications  Medication Dose Route Frequency Provider Last Rate Last Admin   acetaminophen (TYLENOL) tablet 650 mg  650 mg Oral Q6H PRN Rankin, Shuvon B, NP   650 mg at 07/02/22 2052   alum & mag hydroxide-simeth (MAALOX/MYLANTA) 200-200-20 MG/5ML suspension 30 mL  30 mL Oral Q4H PRN Rankin, Shuvon B, NP       benztropine (COGENTIN) tablet 0.5 mg  0.5 mg Oral BID Nkwenti, Doris, NP   0.5 mg at 07/15/22 0800   diphenhydrAMINE (BENADRYL) capsule 50 mg  50 mg Oral Q6H PRN Comer Locket, MD       Or   diphenhydrAMINE (BENADRYL) injection 50 mg  50 mg Intramuscular Q6H PRN Mason Jim, Amy E, MD       docusate sodium (COLACE) capsule 100 mg  100 mg Oral Daily Mason Jim, Amy  E, MD   100 mg at 07/15/22 0800   feeding supplement (ENSURE ENLIVE / ENSURE PLUS) liquid 237 mL  237 mL Oral BID BM Massengill, Nathan, MD   237 mL at 07/15/22 1000   fluticasone (FLONASE) 50 MCG/ACT nasal spray 1 spray  1 spray Each Nare Daily PRN Bobbitt, Shalon E, NP   1 spray at 07/08/22 2126   haloperidol (HALDOL) tablet 2 mg  2  mg Oral BID Abbott Pao, Lyra Alaimo, MD   2 mg at 07/15/22 1100   hydrOXYzine (ATARAX) tablet 25 mg  25 mg Oral TID PRN Sarita Bottom, MD   25 mg at 07/13/22 2036   loratadine (CLARITIN) tablet 10 mg  10 mg Oral Daily Juanpablo Ciresi, MD   10 mg at 07/15/22 0800   OLANZapine zydis (ZYPREXA) disintegrating tablet 5 mg  5 mg Oral Q8H PRN Comer Locket, MD       And   LORazepam (ATIVAN) tablet 1 mg  1 mg Oral PRN Comer Locket, MD       And   ziprasidone (GEODON) injection 20 mg  20 mg Intramuscular PRN Mason Jim, Amy E, MD       magnesium hydroxide (MILK OF MAGNESIA) suspension 30 mL  30 mL Oral Daily PRN Rankin, Shuvon B, NP   30 mL at 07/08/22 1653   OLANZapine zydis (ZYPREXA) disintegrating tablet 10 mg  10 mg Oral Daily Nkwenti, Doris, NP   10 mg at 07/15/22 0801   OLANZapine zydis (ZYPREXA) disintegrating tablet 20 mg  20 mg Oral QHS Nkwenti, Doris, NP   20 mg at 07/14/22 1958   propranolol (INDERAL) tablet 10 mg  10 mg Oral BID Starleen Blue, NP   10 mg at 07/15/22 0759   traZODone (DESYREL) tablet 100 mg  100 mg Oral QHS PRN Starleen Blue, NP   100 mg at 07/13/22 2035    Lab Results: No results found for this or any previous visit (from the past 48 hour(s)).  Blood Alcohol level:  Lab Results  Component Value Date   ETH <10 06/23/2022    Metabolic Disorder Labs: Lab Results  Component Value Date   HGBA1C 5.1 06/23/2022   MPG 99.67 06/23/2022   No results found for: "PROLACTIN" Lab Results  Component Value Date   CHOL 164 06/23/2022   TRIG 32 06/23/2022   HDL 63 06/23/2022   CHOLHDL 2.6 06/23/2022   VLDL 6 06/23/2022   LDLCALC 95 06/23/2022    Physical Findings: AIMS: Facial and Oral Movements Muscles of Facial Expression: None, normal Lips and Perioral Area: None, normal Jaw: None, normal Tongue: None, normal,Extremity Movements Upper (arms, wrists, hands, fingers): None, normal Lower (legs, knees, ankles, toes): None, normal, Trunk Movements Neck, shoulders, hips:  None, normal, Overall Severity Severity of abnormal movements (highest score from questions above): None, normal Incapacitation due to abnormal movements: None, normal Patient's awareness of abnormal movements (rate only patient's report): No Awareness, Dental Status Current problems with teeth and/or dentures?: No Does patient usually wear dentures?: No  CIWA:    COWS:     Musculoskeletal: Strength & Muscle Tone: within normal limits Gait & Station: normal Patient leans: N/A  Psychiatric Specialty Exam:  General Appearance: Appears of stated age, fairly dressed and groomed  Behavior: Cooperative and calm in general  Psychomotor Activity: Mild psychomotor retardation noted  Eye Contact: Fair yet limited Speech: Decreased amount but normal tone and volume noted   Mood: Euthymic Affect: Restricted to flat affect today, yesterday was  smiling appropriately, some worsening noted  Thought Process: Mild to moderate thought blocking noted today, goal directed but concrete Descriptions of Associations: Intact Thought Content: Hallucinations: Reports auditory and visual hallucinations as noted above Delusions: No paranoia  Suicidal Thoughts: Denies SI, intention, plan  Homicidal Thoughts: Denies HI, intention, plan   Alertness/Orientation: Alert, limited orientation noted  Insight: Limited Judgment: Good  Memory: Limited  Executive Functions  Concentration: Limited Attention Span: Limited Recall: Limited Fund of Knowledge: Limited   Assets  Assets:Housing; Social Support; Armed forces logistics/support/administrative officer    Physical Exam: Physical Exam ROS Blood pressure 122/77, pulse 89, temperature 98.1 F (36.7 C), temperature source Oral, resp. rate 18, height 6\' 3"  (1.905 m), weight 81.6 kg, SpO2 99 %. Body mass index is 22.5 kg/m.   Treatment Plan Summary:   ASSESSMENT:  Diagnoses / Active Problems: Principal Problem: Psychosis ([place]) Diagnosis: Principal Problem:   Psychosis  ([place])   PLAN: Safety and Monitoring:  -- Voluntary admission to inpatient psychiatric unit for safety, stabilization and treatment  -- Daily contact with patient to assess and evaluate symptoms and progress in treatment  -- Patient's case to be discussed in multi-disciplinary team meeting  -- Observation Level : q15 minute checks  -- Vital signs:  q12 hours  -- Precautions: suicide, elopement, and assault  2. Medications:   Continue Zyprexa Zydis 10 mg in the morning and 20 mg at bedtime for psychosis, tolerating fairly with partial efficacy  Start Haldol 2 mg twice daily to address psychosis, monitor effects and safety and titrate accordingly  Continue Cogentin 0.5 mg twice daily for EPS prophylaxis  Continue Inderal 10 mg twice daily for anxiety continue Inderal 10 mg twice daily for anxiety  Continue as needed trazodone for sleep  Continue as needed Atarax for anxiety  Continue as needed Zyprexa and Geodon for agitation, none needed since admission  The risks/benefits/side-effects/alternatives to this medication were discussed in detail with the patient and his parents and time was given for questions. The patient consents to medication trial.    -- Metabolic profile and EKG monitoring obtained while on an atypical antipsychotic (BMI: Lipid Panel: HbgA1c: QTc:)      3. Pertinent labs: Reviewed      Lab ordered: None    4. Group and Therapy: -- Encouraged patient to participate in unit milieu and in scheduled group therapies     -- Short Term Goals: Ability to identify changes in lifestyle to reduce recurrence of condition will improve, Ability to verbalize feelings will improve, Ability to disclose and discuss suicidal ideas, and Ability to demonstrate self-control will improve  -- Long Term Goals: Improvement in symptoms so as ready for discharge  6. Discharge Planning:   -- Social work and case management to assist with discharge planning and identification of hospital  follow-up needs prior to discharge  -- Estimated LOS: 5-7 days  -- Discharge Concerns: Need to establish a safety plan; Medication compliance and effectiveness  -- Discharge Goals: Return home with outpatient referrals for mental health follow-up including medication management/psychotherapy      Total Time Spent in Direct Patient Care:  I personally spent 35 minutes on the unit in direct patient care. The direct patient care time included face-to-face time with the patient, reviewing the patient's chart, communicating with other professionals, and coordinating care. Greater than 50% of this time was spent in counseling or coordinating care with the patient regarding goals of hospitalization, psycho-education, and discharge planning needs.   Dian Situ, MD 07/15/2022, 12:28  PM

## 2022-07-16 MED ORDER — DOCUSATE SODIUM 100 MG PO CAPS
100.0000 mg | ORAL_CAPSULE | Freq: Two times a day (BID) | ORAL | Status: DC
  Administered 2022-07-17 – 2022-07-18 (×3): 100 mg via ORAL
  Filled 2022-07-16 (×6): qty 1

## 2022-07-16 NOTE — Progress Notes (Signed)
The patient rated his day as a 6 out of 10 since it was "not the best day". He did not go into further detail. His positive event for the day is that he ate a meal and played basketball with his peers. His goal for tomorrow is to "be positive" and "eat healthy".
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
## 2022-07-16 NOTE — Progress Notes (Signed)
  During medication administration, patient had no complaints.  Medications were explained and each placed in the medicine cup. As I presented fluids to drink, the pt reported, "I just saw something black fly from the Gatorade."  Writer prepared a new cup of Gatorade and place a cup over it.  As I presented the medication cup to the patient, the cup fell. A 2nd dose of the Haldol and Propranolol was pulled from the patient specific box.  All other medications were recovered.  As Clinical research associate presented the medication cup a 2nd time, the patient said, "I just saw something black fly from that, I can't take it."  Patient then reported, "President Biden doesn't want me to take this medication."  Patient's paranoia continued to escalate and after several attempts to explain the importance of taking the medication, the patient refused to take any meds, stating, "They are not really helping me at all, I'm tired of seeing things in my food."  This is writer's first experience with patient refusing medications.  All medications discarded in appropriate waste bin.

## 2022-07-16 NOTE — Progress Notes (Addendum)
  Pt presents with mild anxiety.  Pt denies SI/HI and contracts for safety.  Pt endorses AH stating, "I heard whispers right after you asked me."  Pt denies VH.  Administered medications and provided reassurance.  Pt is safe on the unit with Q 15 minute safety checks.    07/15/22 2046  Psych Admission Type (Psych Patients Only)  Admission Status Involuntary  Psychosocial Assessment  Patient Complaints Anxiety  Eye Contact Fair  Facial Expression Animated  Affect Preoccupied  Speech Slow;Logical/coherent  Interaction Assertive  Motor Activity Slow  Appearance/Hygiene Unremarkable  Behavior Characteristics Cooperative;Anxious  Mood Pleasant;Preoccupied  Thought Process  Coherency Blocking  Content Preoccupation  Delusions None reported or observed  Perception Hallucinations  Hallucination Auditory (pt reported hearing, "whispers right after you asked me".)  Judgment Limited  Confusion WDL  Danger to Self  Current suicidal ideation? Denies  Danger to Others  Danger to Others None reported or observed  Danger to Others Abnormal  Harmful Behavior to others No threats or harm toward other people  Destructive Behavior No threats or harm toward property

## 2022-07-16 NOTE — Progress Notes (Signed)
Pt denies SI/HI/VH but endorses AH and verbally agrees to approach staff if these become apparent or before harming themselves/others. Rates depression 4/10. Rates anxiety 3/10. Rates pain 0/10. Pt stated that his goal today was to greet a cop and to achieve that he would "at lunch go up to a cop and speak." Pt has been in his room for most of the isolating. Pt had a small nose bleed at one point. The nose bleeding stopped and has not come back. MD was notified. Pt says that the voices were getting a little bit better but were still there. Scheduled medications administered to pt, per MD orders. RN provided support and encouragement to pt. Q15 min safety checks implemented and continued. Pt safe on the unit. RN will continue to monitor and intervene as needed.   07/16/22 0828  Psych Admission Type (Psych Patients Only)  Admission Status Involuntary  Psychosocial Assessment  Patient Complaints Depression;Anxiety  Eye Contact Fair  Facial Expression Flat  Affect Flat  Speech Logical/coherent;Slow;Soft  Interaction Assertive  Motor Activity Slow  Appearance/Hygiene Unremarkable  Behavior Characteristics Appropriate to situation;Cooperative;Anxious  Mood Depressed;Anxious;Pleasant  Thought Process  Coherency Blocking  Content WDL  Delusions None reported or observed  Perception Hallucinations  Hallucination Auditory  Judgment Limited  Confusion None  Danger to Self  Current suicidal ideation? Denies  Danger to Others  Danger to Others None reported or observed  Danger to Others Abnormal  Harmful Behavior to others No threats or harm toward other people  Destructive Behavior No threats or harm toward property

## 2022-07-16 NOTE — Progress Notes (Signed)
While sitting in the dayroom with his peers, the patient admitted to having visual hallucinations. On a few occasions, he mentioned that he saw things coming out of the TV. He also mentioned that he saw something black flying towards this author's head.

## 2022-07-16 NOTE — Plan of Care (Signed)
°  Problem: Education: °Goal: Emotional status will improve °Outcome: Progressing °  °Problem: Coping: °Goal: Ability to verbalize frustrations and anger appropriately will improve °Outcome: Progressing °Goal: Ability to demonstrate self-control will improve °Outcome: Progressing °  °

## 2022-07-16 NOTE — [facility] Group Notes (Signed)
Adult Psychoeducational Group Note  Date:  07/16/2022 Time:  9:21 AM  Group Topic/Focus:  Goals Group:   The focus of this group is to help patients establish daily goals to achieve during treatment and discuss how the patient can incorporate goal setting into their daily lives to aide in recovery. Orientation:   The focus of this group is to educate the patient on the purpose and policies of crisis stabilization and provide a format to answer questions about their admission.  The group details unit policies and expectations of patients while admitted.  Participation Level:  Active  Participation Quality:  Attentive  Affect:  Appropriate  Cognitive:  Appropriate  Insight: Appropriate  Engagement in Group:  Engaged  Modes of Intervention:  Orientation  Additional Comments:  Patient attended and participated in the goals group.  Jearl Klinefelter 07/16/2022, 9:21 AM

## 2022-07-17 MED ORDER — POLYETHYLENE GLYCOL 3350 17 G PO PACK
17.0000 g | PACK | Freq: Every day | ORAL | Status: DC
  Administered 2022-07-17 – 2022-07-26 (×10): 17 g via ORAL
  Filled 2022-07-17 (×13): qty 1

## 2022-07-17 MED ORDER — BISACODYL 5 MG PO TBEC: 10.0000 mg | DELAYED_RELEASE_TABLET | Freq: Once | ORAL | Status: DC | PRN

## 2022-07-17 MED ORDER — HALOPERIDOL 5 MG PO TABS
5.0000 mg | ORAL_TABLET | Freq: Two times a day (BID) | ORAL | Status: DC
  Administered 2022-07-17 – 2022-07-20 (×6): 5 mg via ORAL
  Filled 2022-07-17 (×8): qty 1

## 2022-07-17 NOTE — Progress Notes (Addendum)
[facility] MD Progress Note  07/17/2022 11:45 AM Joseph Wilson  MRN:  [id number]  Chief Complaint: Psychosis  Reason for Admission:  Joseph Wilson is a 21 y.o., male with no past psychiatric history who presents to the [facility] from [facility] for evaluation and management of worsening psychosis and paranoia. According to outside records, the patient presented to the behavioral health unit for [place] via [place] police voluntarily as a walk-in with complaint paranoia, auditory and visual hallucinations.The patient is currently on Hospital Day 23.   Chart Review from last 24 hours:  The patient's chart was reviewed and nursing notes were reviewed. The patient's case was discussed in multidisciplinary team meeting. Per nursing, he refused evening meds due to command AH and VH. He refused dinner and breakfast due to paranoia about food. He had a small nose bleed that was controlled. He attended select groups. Per MAR he was compliant with daytime medications yesterday and today and did receive MOM X1 and Maalox X1 yesterday PRN.  Information Obtained Today During Patient Interview: The patient was seen in his room. On exam he is paranoid and guarded. He admits he is paranoid on the unit but will not say anyone specific he feels is making him uneasy. He states he did not take his medications last night because he saw something in the cup of liquid he was going to take with his po meds. He states he believes he is "implanted with a device" that allows others to hear him and others to speak to him. He continues to endorse command AH telling him not to eat and to "do mischief." He states the voices are those of "false celebrities." He admits to [facility] of seeing things move from other's hands into his food and he is paranoid about eating. He admits to belief in thought broadcasting and states he "feels possessed" by something trying to control him and give him "wicked  intentions." He describes belief that he has a "spiritual gift from God" to "hear and see and the spirit." He denies SI or HI. He states he is frustrated that medications are not helping and he questions if he still needs medications. He was provided supportive reassurance and time was spent educating him on his medications. He was encouraged to comply with medications in an effort to reduce his psychotic symptoms in an effort to go home. Forced medication over objection protocol was discussed as a last resort if he continues to refuse po medications and is felt to be decompensating. He states he wants to go home, and I advised that given his recent command AH and response to [place] that he is not felt to be ready for discharge. He was encouraged to continue voluntary treatment and IVC process was explained if he attempts to sign 72 hour form before he is felt to be ready for discharge. He perceives that he slept fairly well last night despite report of sleeping only 3.5 hours. He states he is constipated and I discussed start of Miralax and PRN Dulcolax today.   Collateral: Called patient's father and spoke 10 minutes. Updated father on his recent medication refusal and response to command AH and VH. I advised that I feel we need to titrate up on Haldol in combination with Zyprexa and continue to monitor to see if psychosis improves. I discussed option of Clozaril and advised that this could be considered if he does not improve on current regimen. The r/b/se to Clozaril were  briefly discussed. Father had questions about endpoint of treatment and I advised that the goal is for him not to be responding to command AH and that he be eating and sleeping better prior to discharge. Father questions if this is his new baseline, and differential diagnosis of schizophreniform d/o vs schizophrenia was again discussed. Father questioned about therapy, and I discussed that therapy targeting learning principles of reality testing  and learning distraction techniques to help ignore residual AVH would be beneficial in the future.  Sleep: 3.5 hours  Principal Problem: Schizophrenia spectrum disorder with psychotic disorder type not yet determined ([place]) Diagnosis: Principal Problem:   Schizophrenia spectrum disorder with psychotic disorder type not yet determined [facility])  Past Psychiatric History: See H&P  Past Medical History:  Past Medical History:  Diagnosis Date   Acid reflux     Family History:see H&P  Family Psychiatric  History: See H&P  Social History: See H&P  Current Medications: Current Facility-Administered Medications  Medication Dose Route Frequency Provider Last Rate Last Admin   acetaminophen (TYLENOL) tablet 650 mg  650 mg Oral Q6H PRN Rankin, Shuvon B, NP   650 mg at 07/02/22 2052   alum & mag hydroxide-simeth (MAALOX/MYLANTA) 200-200-20 MG/5ML suspension 30 mL  30 mL Oral Q4H PRN Rankin, Shuvon B, NP   30 mL at 07/16/22 0828   benztropine (COGENTIN) tablet 0.5 mg  0.5 mg Oral BID Nicholes Rough, NP   0.5 mg at 07/17/22 1021   diphenhydrAMINE (BENADRYL) capsule 50 mg  50 mg Oral Q6H PRN Harlow Asa, MD       Or   diphenhydrAMINE (BENADRYL) injection 50 mg  50 mg Intramuscular Q6H PRN Nelda Marseille, Ejay Lashley E, MD       docusate sodium (COLACE) capsule 100 mg  100 mg Oral BID Kristl Morioka E, MD   100 mg at 07/17/22 1021   feeding supplement (ENSURE ENLIVE / ENSURE PLUS) liquid 237 mL  237 mL Oral BID BM Massengill, Nathan, MD   237 mL at 07/17/22 1026   fluticasone (FLONASE) 50 MCG/ACT nasal spray 1 spray  1 spray Each Nare Daily PRN Bobbitt, Shalon E, NP   1 spray at 07/08/22 2126   haloperidol (HALDOL) tablet 2 mg  2 mg Oral BID Winfred Leeds, Nadir, MD   2 mg at 07/17/22 1023   hydrOXYzine (ATARAX) tablet 25 mg  25 mg Oral TID PRN Dian Situ, MD   25 mg at 07/13/22 2036   loratadine (CLARITIN) tablet 10 mg  10 mg Oral Daily Attiah, Nadir, MD   10 mg at 07/17/22 1021   OLANZapine zydis (ZYPREXA)  disintegrating tablet 5 mg  5 mg Oral Q8H PRN Harlow Asa, MD       And   LORazepam (ATIVAN) tablet 1 mg  1 mg Oral PRN Harlow Asa, MD       And   ziprasidone (GEODON) injection 20 mg  20 mg Intramuscular PRN Nelda Marseille, Cristen Murcia E, MD       magnesium hydroxide (MILK OF MAGNESIA) suspension 30 mL  30 mL Oral Daily PRN Rankin, Shuvon B, NP   30 mL at 07/16/22 0829   OLANZapine zydis (ZYPREXA) disintegrating tablet 10 mg  10 mg Oral Daily Nkwenti, Doris, NP   10 mg at 07/17/22 1021   OLANZapine zydis (ZYPREXA) disintegrating tablet 20 mg  20 mg Oral QHS Nkwenti, Doris, NP   20 mg at 07/15/22 2047   propranolol (INDERAL) tablet 10 mg  10 mg Oral BID  Nicholes Rough, NP   10 mg at 07/17/22 1022   traZODone (DESYREL) tablet 100 mg  100 mg Oral QHS PRN Nicholes Rough, NP   100 mg at 07/13/22 2035    Lab Results: No results found for this or any previous visit (from the past 9 hour(s)).  Blood Alcohol level:  Lab Results  Component Value Date   ETH <10 35/00/9381    Metabolic Disorder Labs: Lab Results  Component Value Date   HGBA1C 5.1 06/23/2022   MPG 99.67 06/23/2022   No results found for: "PROLACTIN" Lab Results  Component Value Date   CHOL 164 06/23/2022   TRIG 32 06/23/2022   HDL 63 06/23/2022   CHOLHDL 2.6 06/23/2022   VLDL 6 06/23/2022   LDLCALC 95 06/23/2022    Physical Findings: AIMS: Facial and Oral Movements Muscles of Facial Expression: None, normal Lips and Perioral Area: None, normal Jaw: None, normal Tongue: None, normal,Extremity Movements Upper (arms, wrists, hands, fingers): None, normal Lower (legs, knees, ankles, toes): None, normal, Trunk Movements Neck, shoulders, hips: None, normal, Overall Severity Severity of abnormal movements (highest score from questions above): None, normal Incapacitation due to abnormal movements: None, normal Patient's awareness of abnormal movements (rate only patient's report): No Awareness, Dental Status Current  problems with teeth and/or dentures?: No Does patient usually wear dentures?: No   Musculoskeletal: Strength & Muscle Tone: within normal limits Gait & Station: normal Patient leans: N/A  Psychiatric Specialty Exam:  General Appearance: Appears stated age, fairly dressed and groomed, casually dressed  Psychomotor Activity: stands during assessment - no tremor or akathisias noted  Eye Contact: Fair   Speech: more elaborative with his answers - normal fluency and rate   Mood: aloof, frustrated  Affect: Restricted, guarded  Thought Process: No thought blocking today, concrete, more linear  Thought Content: Admits to paranoia on the unit and persecutory delusions about his food being contaminated, belief in thought broadcasting and belief he is implanted with a device; endorses command  AH, VH and sense of being controlled; some hyper-religious content today; is not grossly responding to internal/external stimuli on exam  Suicidal Thoughts: Denies SI, intention, plan   Homicidal Thoughts: Denies HI, intention, plan   Alertness/Orientation: Oriented to month, year and city   Insight: Limited  Judgment: Poor - refused meds, not eating  Memory: Proofreader  Concentration: Fair Attention Span: Fair Recall: Limited Fund of Knowledge: Limited   Assets  Assets:Housing; Social Support; Armed forces logistics/support/administrative officer  Physical Exam Vitals and nursing note reviewed.  Constitutional:      Appearance: Normal appearance.  HENT:     Head: Normocephalic.  Pulmonary:     Effort: Pulmonary effort is normal.  Neurological:     General: No focal deficit present.     Mental Status: He is alert.    Review of Systems  Respiratory:  Negative for shortness of breath.   Cardiovascular:  Negative for chest pain.  Gastrointestinal:  Positive for constipation. Negative for abdominal pain, diarrhea, nausea and vomiting.  Neurological:  Negative for dizziness and headaches.    Blood pressure 111/74, pulse 88, temperature 98.2 F (36.8 C), temperature source Oral, resp. rate 18, height '6\' 3"'  (1.905 m), weight 81.6 kg, SpO2 99 %. Body mass index is 22.5 kg/m.   PLAN: Treatment Plan Summary: Diagnoses / Active Problems: Unspecified schizophrenia spectrum and other psychotic d/o (r/o schizophreniform d/o, r/o schizophrenia)   PLAN: Safety and Monitoring:             --  Voluntary admission to inpatient psychiatric unit for safety, stabilization and treatment             -- Daily contact with patient to assess and evaluate symptoms and progress in treatment             -- Patient's case to be discussed in multi-disciplinary team meeting             -- Observation Level : q15 minute checks             -- Vital signs:  q12 hours             -- Precautions: suicide, elopement, and assault   2. Psychiatric Diagnoses and Treatment:  Unspecified schizophrenia spectrum and other psychotic d/o (r/o schizophreniform d/o, r/o schizophrenia) - Continue Zyprexa Zydis 670m qhs and 126mqam - partial efficacy - Increase Haldol to 70m57mid for residual psychosis (dose increase discussed with patient and father) (Lipid panel WNL, A1c 5.1, QTc 370m84md on repeat 8/2 393ms58m7ms 6m/7) Repeating EKG while on 2 antipsychotics - Continue Cogentin 0.70mg bi71mhile on 2 antipsychotics - Continue Inderal 10mg bi32mr anxiety             -- Continue Vistaril 270mg tid54mneeded for anxiety            -- Encourage compliance with PRN trazodone 100mg at n90m for sleep             -- New onset psychosis w/u (Head CT noncontrast negative on 7/24; folate 13.5, B12 578, UDS negative, TSH 2.775, ETOH <10, Mag 2.0, CMP WNL other than anion gap 4, repeat CBC WNL, ESR 0, HIV nonreactive, RPR nonreactive, heavy metal WNL, ANA negative, Ceruloplasmin low at 13.8, B1 85.7)  -- will ask staff to bring him pre-packaged foods and beverages to help with paranoia regarding meals                           3.Medical Issues Being Addressed:              Leukopenia - resolved             -- Repeat WBC 5.4 and ANC 1800 on 8/1               Low Ceruloplasmin              -- Checking 24 hour urine copper negative; Serum copper WNL at 72   Constipation   -- start Miralax and continue Colace 100mg bid  103mulcolax PRN X1 if needed  -- encouraged po fluids and fiber    4. Discharge Planning:              -- Social work and case management to assist with discharge planning and identification of hospital follow-up needs prior to discharge             -- Discharge Concerns: Need to establish a safety plan; Medication compliance and effectiveness             -- Discharge Goals: Return home with outpatient referrals for mental health follow-up including medication management/psychotherapy    Total Time Spent in Direct Patient Care:  I personally spent 45 minutes on the unit in direct patient care. The direct patient care time included face-to-face time with the patient, reviewing the patient's chart, communicating with other professionals, and coordinating care. Greater than 50% of this time was  spent in counseling or coordinating care with the patient regarding goals of hospitalization, psycho-education, and discharge planning needs.   Harlow Asa, MD, FAPA 07/17/2022, 11:45 AM

## 2022-07-17 NOTE — Group Note (Signed)
LCSW Group Therapy Note   Group Date: 07/17/2022 Start Time: 1300 End Time: 1400  Type of Therapy and Topic: Group Therapy: Control  Participation Level: Minimal  Description of Group: In this group patients will discuss what is out of their control, what is somewhat in their control, and what is within their control.  They will be encouraged to explore what issues they can control and what issues are out of their control within their daily lives. They will be guided to discuss their thoughts, feelings, and behaviors related to these issues. The group will process together ways to better control things that are well within our own control and how to notice and accept the things that are not within our control. This group will be process-oriented, with patients participating in exploration of their own experiences as well as giving and receiving support and challenge from other group members.  During this group 2 worksheets will be provided to each patient to follow along and fill out.   Therapeutic Goals: 1. Patient will identify what is within their control and what is not within their control. 2. Patient will identify their thoughts and feelings about having control over their own lives. 3. Patient will identify their thoughts and feelings about not having control over everything in their lives.. 4. Patient will identify ways that they can have more control over their own lives. 5. Patient will identify areas were they can allow others to help them or provide assistance.  Summary of Patient Progress: The Pt attended group and remained there the entire time.  The Pt accepted all worksheets that were provided and followed along throughout the session.  The Pt was able to discuss times when they felt good, felt caution that they were not continuing to feel good, and when they began to feel bad mentally or physically.  The Pt discussed emotions and how those emotions can affect their mental health.   The Pt was appropriate with their peers and was open to sharing in the discussion with their peers.    Wilena Tyndall M Emerita Berkemeier, LCSWA 07/17/2022  1:57 PM    

## 2022-07-17 NOTE — Progress Notes (Signed)
Patient did not attend morning orientation group.  

## 2022-07-17 NOTE — Plan of Care (Signed)
  Problem: Coping: Goal: Ability to verbalize frustrations and anger appropriately will improve Outcome: Progressing Goal: Ability to demonstrate self-control will improve Outcome: Progressing   Problem: Education: Goal: Emotional status will improve Outcome: Not Progressing Goal: Mental status will improve Outcome: Not Progressing   Problem: Activity: Goal: Interest or engagement in activities will improve Outcome: Not Progressing

## 2022-07-17 NOTE — Plan of Care (Signed)
  Problem: Education: Goal: Emotional status will improve Outcome: Progressing   Problem: Activity: Goal: Interest or engagement in activities will improve Outcome: Progressing   Problem: Coping: Goal: Ability to verbalize frustrations and anger appropriately will improve Outcome: Progressing   Problem: Education: Goal: Mental status will improve Outcome: Not Progressing

## 2022-07-17 NOTE — Progress Notes (Signed)
     07/16/22 2106  Psych Admission Type (Psych Patients Only)  Admission Status Involuntary  Psychosocial Assessment  Patient Complaints Anxiety;Depression  Eye Contact Fair  Facial Expression Flat  Affect Anxious  Speech Logical/coherent  Interaction Assertive  Motor Activity Slow  Appearance/Hygiene Unremarkable  Behavior Characteristics Resistant to care  Mood Pleasant;Anxious;Suspicious  Thought Process  Coherency Blocking  Content Paranoia  Delusions Paranoid  Perception Hallucinations  Hallucination Auditory  Judgment Limited  Confusion None  Danger to Self  Current suicidal ideation? Denies  Danger to Others  Danger to Others None reported or observed  Danger to Others Abnormal  Harmful Behavior to others No threats or harm toward other people  Destructive Behavior No threats or harm toward property

## 2022-07-17 NOTE — Progress Notes (Signed)
     07/17/22 2019  Psych Admission Type (Psych Patients Only)  Admission Status Involuntary  Psychosocial Assessment  Patient Complaints Anxiety;Worrying  Eye Contact Fair  Facial Expression Flat;Pensive;Worried  Affect Appropriate to circumstance  Speech Soft;Logical/coherent  Interaction Assertive  Motor Activity Slow  Appearance/Hygiene Unremarkable  Behavior Characteristics Restless;Resistant to care  Mood Preoccupied;Anxious  Thought Process  Coherency Circumstantial  Content Preoccupation;Religiosity;Paranoia  Delusions Paranoid  Perception Hallucinations  Hallucination Auditory;Visual  Judgment Limited  Confusion None  Danger to Self  Current suicidal ideation? Denies  Danger to Others  Danger to Others None reported or observed  Danger to Others Abnormal  Harmful Behavior to others No threats or harm toward other people  Destructive Behavior No threats or harm toward property

## 2022-07-18 LAB — COPPER, URINE - RANDOM OR 24 HOUR
Copper / Creatinine Ratio: 4 ug/g creat (ref 0–49)
Copper, 24H Ur: 5 ug/24 hr (ref 3–35)
Copper, Ur: 3 ug/L
Creatinine(Crt),U: 0.73 g/L (ref 0.30–3.00)
Total Volume: 1550

## 2022-07-18 LAB — CBC WITH DIFFERENTIAL/PLATELET
Abs Immature Granulocytes: 0.01 10*3/uL (ref 0.00–0.07)
Basophils Absolute: 0 10*3/uL (ref 0.0–0.1)
Basophils Relative: 1 %
Eosinophils Absolute: 0.1 10*3/uL (ref 0.0–0.5)
Eosinophils Relative: 1 %
HCT: 47.2 % (ref 39.0–52.0)
Hemoglobin: 16.1 g/dL (ref 13.0–17.0)
Immature Granulocytes: 0 %
Lymphocytes Relative: 34 %
Lymphs Abs: 2.1 10*3/uL (ref 0.7–4.0)
MCH: 31.8 pg (ref 26.0–34.0)
MCHC: 34.1 g/dL (ref 30.0–36.0)
MCV: 93.1 fL (ref 80.0–100.0)
Monocytes Absolute: 0.6 10*3/uL (ref 0.1–1.0)
Monocytes Relative: 9 %
Neutro Abs: 3.3 10*3/uL (ref 1.7–7.7)
Neutrophils Relative %: 55 %
Platelets: 222 10*3/uL (ref 150–400)
RBC: 5.07 MIL/uL (ref 4.22–5.81)
RDW: 12.2 % (ref 11.5–15.5)
WBC: 6.1 10*3/uL (ref 4.0–10.5)
nRBC: 0 % (ref 0.0–0.2)

## 2022-07-18 MED ORDER — DOCUSATE SODIUM 100 MG PO CAPS
100.0000 mg | ORAL_CAPSULE | Freq: Every day | ORAL | Status: DC
  Administered 2022-07-19 – 2022-07-26 (×8): 100 mg via ORAL
  Filled 2022-07-18 (×10): qty 1

## 2022-07-18 MED ORDER — SALINE SPRAY 0.65 % NA SOLN
1.0000 | NASAL | Status: DC | PRN
Start: 2022-07-18 — End: 2022-07-26

## 2022-07-18 MED ORDER — LORATADINE 10 MG PO TABS
10.0000 mg | ORAL_TABLET | Freq: Every day | ORAL | Status: DC | PRN
  Administered 2022-07-22 – 2022-07-25 (×3): 10 mg via ORAL
  Filled 2022-07-18 (×3): qty 1

## 2022-07-18 MED ORDER — WHITE PETROLATUM EX OINT
TOPICAL_OINTMENT | CUTANEOUS | Status: DC | PRN
Start: 2022-07-18 — End: 2022-07-26

## 2022-07-18 NOTE — Progress Notes (Signed)
Meal Intake:  Breakfast: Cereal and milk 100%  Snack: Ensure  Lunch: Rice and Stir fry 50%  Snack: Ensure   Dinner: Chicken and a side 100%

## 2022-07-18 NOTE — Progress Notes (Signed)
Pt only came for snacks and then went back to his room.

## 2022-07-18 NOTE — Progress Notes (Addendum)
Pt was found in room with a nose bleed. Pt reported bleeding concluded 4 min after beginning at 1300. Pt reports this is his second in 2 days. Pt states he has been picking at the interior of his nose. Pt remains safe on Q15 min checks and contracts for safety.

## 2022-07-18 NOTE — Progress Notes (Signed)
     07/18/22 2102  Psych Admission Type (Psych Patients Only)  Admission Status Involuntary  Psychosocial Assessment  Patient Complaints Anxiety  Eye Contact Fair  Facial Expression Flat;Worried  Affect Appropriate to circumstance  Speech Soft;Logical/coherent  Interaction Assertive  Motor Activity Slow  Appearance/Hygiene Unremarkable  Behavior Characteristics Cooperative;Anxious  Mood Preoccupied;Anxious  Thought Process  Coherency Circumstantial  Content Preoccupation;Religiosity  Delusions Paranoid  Perception Hallucinations  Hallucination Auditory  Judgment Limited  Confusion None  Danger to Self  Current suicidal ideation? Denies  Danger to Others  Danger to Others None reported or observed  Danger to Others Abnormal  Harmful Behavior to others No threats or harm toward other people  Destructive Behavior No threats or harm toward property

## 2022-07-18 NOTE — Progress Notes (Signed)
Patient appears anxious. Patient denies SI/HI. Pt endorses hearing voices that tell him not to eat that he "listens to". Pt also believes that he has enemies that have been poisoning his food. Pt has been cooperative eating packaged food and ate cereal and milk for breakfast. EKG was completed and placed in the chart. Patient complied with morning medication with no reported side effects. Patient remains safe on Q84min checks and contracts for safety.       07/18/22 1042  Psych Admission Type (Psych Patients Only)  Admission Status Involuntary  Psychosocial Assessment  Patient Complaints Anxiety;Worrying  Eye Contact Fair  Facial Expression Flat;Worried  Affect Appropriate to circumstance  Speech Soft;Logical/coherent  Interaction Assertive  Motor Activity Slow  Appearance/Hygiene Unremarkable  Behavior Characteristics Cooperative;Anxious  Mood Anxious;Preoccupied  Thought Process  Coherency Circumstantial  Content Preoccupation;Religiosity  Delusions Paranoid  Perception Hallucinations  Hallucination Auditory  Judgment Limited  Confusion None  Danger to Self  Current suicidal ideation? Denies  Danger to Others  Danger to Others None reported or observed

## 2022-07-18 NOTE — Plan of Care (Signed)
  Problem: Education: Goal: Knowledge of [facility] Education information/materials will improve Outcome: Progressing Goal: Emotional status will improve Outcome: Progressing Goal: Mental status will improve Outcome: Progressing Goal: Verbalization of understanding the information provided will improve Outcome: Progressing   Problem: Activity: Goal: Interest or engagement in activities will improve Outcome: Progressing Goal: Sleeping patterns will improve Outcome: Progressing   Problem: Coping: Goal: Ability to verbalize frustrations and anger appropriately will improve Outcome: Progressing Goal: Ability to demonstrate self-control will improve Outcome: Progressing   Problem: Health Behavior/Discharge Planning: Goal: Identification of resources available to assist in meeting health care needs will improve Outcome: Progressing Goal: Compliance with treatment plan for underlying cause of condition will improve Outcome: Progressing   Problem: Physical Regulation: Goal: Ability to maintain clinical measurements within normal limits will improve Outcome: Progressing   Problem: Safety: Goal: Periods of time without injury will increase Outcome: Progressing   Problem: Activity: Goal: Will verbalize the importance of balancing activity with adequate rest periods Outcome: Progressing   Problem: Education: Goal: Will be free of psychotic symptoms Outcome: Progressing Goal: Knowledge of the prescribed therapeutic regimen will improve Outcome: Progressing   Problem: Coping: Goal: Coping ability will improve Outcome: Progressing Goal: Will verbalize feelings Outcome: Progressing   Problem: Health Behavior/Discharge Planning: Goal: Compliance with prescribed medication regimen will improve Outcome: Progressing   Problem: Nutritional: Goal: Ability to achieve adequate nutritional intake will improve Outcome: Progressing   Problem: Role Relationship: Goal:  Ability to communicate needs accurately will improve Outcome: Progressing Goal: Ability to interact with others will improve Outcome: Progressing   Problem: Safety: Goal: Ability to redirect hostility and anger into socially appropriate behaviors will improve Outcome: Progressing Goal: Ability to remain free from injury will improve Outcome: Progressing   Problem: Self-Care: Goal: Ability to participate in self-care as condition permits will improve Outcome: Progressing   Problem: Self-Concept: Goal: Will verbalize positive feelings about self Outcome: Progressing   Problem: Education: Goal: Ability to state activities that reduce stress will improve Outcome: Progressing   Problem: Coping: Goal: Ability to identify and develop effective coping behavior will improve Outcome: Progressing   Problem: Self-Concept: Goal: Ability to identify factors that promote anxiety will improve Outcome: Progressing Goal: Level of anxiety will decrease Outcome: Progressing Goal: Ability to modify response to factors that promote anxiety will improve Outcome: Progressing   Problem: Education: Goal: Utilization of techniques to improve thought processes will improve Outcome: Progressing Goal: Knowledge of the prescribed therapeutic regimen will improve Outcome: Progressing   Problem: Activity: Goal: Interest or engagement in leisure activities will improve Outcome: Progressing Goal: Imbalance in normal sleep/wake cycle will improve Outcome: Progressing   Problem: Coping: Goal: Coping ability will improve Outcome: Progressing Goal: Will verbalize feelings Outcome: Progressing   Problem: Health Behavior/Discharge Planning: Goal: Ability to make decisions will improve Outcome: Progressing Goal: Compliance with therapeutic regimen will improve Outcome: Progressing   Problem: Role Relationship: Goal: Will demonstrate positive changes in social behaviors and relationships Outcome:  Progressing   Problem: Safety: Goal: Ability to disclose and discuss suicidal ideas will improve Outcome: Progressing Goal: Ability to identify and utilize support systems that promote safety will improve Outcome: Progressing   Problem: Self-Concept: Goal: Will verbalize positive feelings about self Outcome: Progressing Goal: Level of anxiety will decrease Outcome: Progressing

## 2022-07-18 NOTE — Progress Notes (Addendum)
[facility] MD Progress Note  07/18/2022 7:10 AM Joseph Wilson  MRN:  [id number]  Chief Complaint: Psychosis  Reason for Admission:  Joseph Wilson Joseph Wilson is a 21 y.o., male with no past psychiatric history who presents to the [facility] from [facility] for evaluation and management of worsening psychosis and paranoia. According to outside records, the patient presented to the behavioral health unit for [place] via [place] police voluntarily as a walk-in with complaint paranoia, auditory and visual hallucinations.The patient is currently on Hospital Day 24.   Chart Review from last 24 hours:  The patient's chart was reviewed and nursing notes were reviewed. The patient's case was discussed in multidisciplinary team meeting. Per nursing, he attended 1 group and did comply with taking medications yesterday. He did not require PRNs.  Information Obtained Today During Patient Interview: The patient was seen in the exam room. He reports that his AH are improving overall but still tell him at times not to eat. He admits that sometimes he continues to do what the voices tell him about his meals. He did eat dinner last night and states he is drinking fluids and ensure. He states he is fearful "there are demons in the food." He states he still has intermittent VH of seeing mists and shadows. He is less religiously preoccupied on exam today and does not make as many hyper-religious statements. He states that while watching Avatar movie yesterday he felt some of the content of the movie was directed to him. He denies SI or HI. He believes his thoughts "can be heard in the spiritual realm" but not longer believes others can hear his thoughts. He continues to believe he is implanted with a device that allows "nature" to hear him. He voices no physical complaints and states his constipation has resolved. He reports good sleep. We discussed his interval improvement and he was encouraged to  continue complying with medications. He was encouraged to eat and drink and practice reality testing when confronted with residual AVH and delusions.   Sleep: 7.5 hours  Principal Problem: Schizophrenia spectrum disorder with psychotic disorder type not yet determined ([place]) Diagnosis: Principal Problem:   Schizophrenia spectrum disorder with psychotic disorder type not yet determined [facility])  Past Psychiatric History: See H&P  Past Medical History:  Past Medical History:  Diagnosis Date   Acid reflux     Family History:see H&P  Family Psychiatric  History: See H&P  Social History: See H&P  Current Medications: Current Facility-Administered Medications  Medication Dose Route Frequency Provider Last Rate Last Admin   acetaminophen (TYLENOL) tablet 650 mg  650 mg Oral Q6H PRN Rankin, Shuvon B, NP   650 mg at 07/02/22 2052   alum & mag hydroxide-simeth (MAALOX/MYLANTA) 200-200-20 MG/5ML suspension 30 mL  30 mL Oral Q4H PRN Rankin, Shuvon B, NP   30 mL at 07/16/22 0828   benztropine (COGENTIN) tablet 0.5 mg  0.5 mg Oral BID Nicholes Rough, NP   0.5 mg at 07/17/22 2019   bisacodyl (DULCOLAX) EC tablet 10 mg  10 mg Oral Once PRN Harlow Asa, MD       diphenhydrAMINE (BENADRYL) capsule 50 mg  50 mg Oral Q6H PRN Nelda Marseille, Kelia Gibbon E, MD       Or   diphenhydrAMINE (BENADRYL) injection 50 mg  50 mg Intramuscular Q6H PRN Nelda Marseille, Jamela Cumbo E, MD       docusate sodium (COLACE) capsule 100 mg  100 mg Oral BID Harlow Asa, MD  100 mg at 07/17/22 1701   feeding supplement (ENSURE ENLIVE / ENSURE PLUS) liquid 237 mL  237 mL Oral BID BM Massengill, Nathan, MD   237 mL at 07/17/22 1402   fluticasone (FLONASE) 50 MCG/ACT nasal spray 1 spray  1 spray Each Nare Daily PRN Bobbitt, Shalon E, NP   1 spray at 07/08/22 2126   haloperidol (HALDOL) tablet 5 mg  5 mg Oral BID Viann Fish E, MD   5 mg at 07/17/22 2020   hydrOXYzine (ATARAX) tablet 25 mg  25 mg Oral TID PRN Dian Situ, MD   25 mg at  07/13/22 2036   loratadine (CLARITIN) tablet 10 mg  10 mg Oral Daily Attiah, Nadir, MD   10 mg at 07/17/22 1021   OLANZapine zydis (ZYPREXA) disintegrating tablet 5 mg  5 mg Oral Q8H PRN Harlow Asa, MD       And   LORazepam (ATIVAN) tablet 1 mg  1 mg Oral PRN Harlow Asa, MD       And   ziprasidone (GEODON) injection 20 mg  20 mg Intramuscular PRN Nelda Marseille, Eleri Ruben E, MD       magnesium hydroxide (MILK OF MAGNESIA) suspension 30 mL  30 mL Oral Daily PRN Rankin, Shuvon B, NP   30 mL at 07/16/22 0829   OLANZapine zydis (ZYPREXA) disintegrating tablet 10 mg  10 mg Oral Daily Nkwenti, Doris, NP   10 mg at 07/17/22 1021   OLANZapine zydis (ZYPREXA) disintegrating tablet 20 mg  20 mg Oral QHS Nkwenti, Doris, NP   20 mg at 07/17/22 2020   polyethylene glycol (MIRALAX / GLYCOLAX) packet 17 g  17 g Oral Daily Nelda Marseille, Marsa Matteo E, MD   17 g at 07/17/22 1305   propranolol (INDERAL) tablet 10 mg  10 mg Oral BID Nicholes Rough, NP   10 mg at 07/17/22 2019   traZODone (DESYREL) tablet 100 mg  100 mg Oral QHS PRN Nicholes Rough, NP   100 mg at 07/13/22 2035    Lab Results: No results found for this or any previous visit (from the past 68 hour(s)).  Blood Alcohol level:  Lab Results  Component Value Date   ETH <10 78/24/2353    Metabolic Disorder Labs: Lab Results  Component Value Date   HGBA1C 5.1 06/23/2022   MPG 99.67 06/23/2022   No results found for: "PROLACTIN" Lab Results  Component Value Date   CHOL 164 06/23/2022   TRIG 32 06/23/2022   HDL 63 06/23/2022   CHOLHDL 2.6 06/23/2022   VLDL 6 06/23/2022   LDLCALC 95 06/23/2022    Physical Findings: AIMS: Facial and Oral Movements Muscles of Facial Expression: None, normal Lips and Perioral Area: None, normal Jaw: None, normal Tongue: None, normal,Extremity Movements Upper (arms, wrists, hands, fingers): None, normal Lower (legs, knees, ankles, toes): None, normal, Trunk Movements Neck, shoulders, hips: None, normal, Overall  Severity Severity of abnormal movements (highest score from questions above): None, normal Incapacitation due to abnormal movements: None, normal Patient's awareness of abnormal movements (rate only patient's report): No Awareness, Dental Status Current problems with teeth and/or dentures?: No Does patient usually wear dentures?: No   Musculoskeletal: Strength & Muscle Tone: within normal limits Gait & Station: normal Patient leans: N/A  Psychiatric Specialty Exam:  General Appearance: Appears stated age, fairly dressed and groomed, casually dressed  Psychomotor Activity: no cogwheeilng, no stiffness, no tremor, AIMS 0  Eye Contact: Fair   Speech: more elaborative with his answers - normal  fluency and rate  Mood: aloof but calm  Affect: Restricted, guarded  Thought Process: No thought blocking today, concrete, more linear  Thought Content: Has residual ideas of reference, command AH, VH, belief he is implanted with a device, belief that his thoughts can be heard in spiritual realm, and persecutory/paranoid beliefs about demons in his food; he is not grossly responding to internal/external stimuli during assessment, denies SI or HI  Suicidal Thoughts: Denies SI, intention, plan   Homicidal Thoughts: Denies HI, intention, plan   Alertness/Orientation: Oriented to month, year and city   Insight: Limited  Judgment: Improved - complying with meds  Memory: Proofreader  Concentration: Fair Attention Span: Fair Recall: Limited Fund of Knowledge: Limited  Assets  Assets:Housing; Social Support; Armed forces logistics/support/administrative officer  Physical Exam Vitals and nursing note reviewed.  Constitutional:      Appearance: Normal appearance.  HENT:     Head: Normocephalic.  Pulmonary:     Effort: Pulmonary effort is normal.  Neurological:     General: No focal deficit present.     Mental Status: He is alert.    Review of Systems  Respiratory:  Negative for shortness of  breath.   Cardiovascular:  Negative for chest pain.  Gastrointestinal:  Negative for abdominal pain, constipation, diarrhea, nausea and vomiting.  Neurological:  Negative for dizziness and headaches.   Blood pressure 123/79, pulse 93, temperature 98.7 F (37.1 C), temperature source Oral, resp. rate 16, height _0  (1.905 m), weight 81.6 kg, SpO2 96 %. Body mass index is 22.5 kg/m.   PLAN: Treatment Plan Summary: Diagnoses / Active Problems: Unspecified schizophrenia spectrum and other psychotic d/o (r/o schizophreniform d/o, r/o schizophrenia)   PLAN: Safety and Monitoring:             -- Voluntary admission to inpatient psychiatric unit for safety, stabilization and treatment             -- Daily contact with patient to assess and evaluate symptoms and progress in treatment             -- Patient's case to be discussed in multi-disciplinary team meeting             -- Observation Level : q15 minute checks             -- Vital signs:  q12 hours             -- Precautions: suicide, elopement, and assault   2. Psychiatric Diagnoses and Treatment:  Unspecified schizophrenia spectrum and other psychotic d/o (r/o schizophreniform d/o, r/o schizophrenia) - Continue Zyprexa Zydis 27m qhs and 151mqam - partial efficacy - Continue Haldol 21m44mid for residual psychosis  (Lipid panel WNL, A1c 5.1, QTc 370m62md on repeat 8/2 393ms28m7ms 66m/7 and 391ms o29m17) - Continue Cogentin 0.21mg bid60mile on 2 antipsychotics - monitoring AIMS and for EPS and constipation - Continue Inderal 10mg bid42m anxiety             -- Continue Vistaril 221mg tid 69meeded for anxiety            -- Continue PRN trazodone 100mg at ni30mfor sleep             -- New onset psychosis w/u (Head CT noncontrast negative on 7/24; folate 13.5, B12 578, UDS negative, TSH 2.775, ETOH <10, Mag 2.0, CMP WNL other than anion gap 4, repeat CBC WNL, ESR 0, HIV nonreactive, RPR nonreactive, heavy  metal WNL, ANA negative,  Ceruloplasmin low at 13.8, B1 85.7)  -- Encouraging pre-packaged foods and beverages to help with paranoia regarding meals - attempting to record po intake and Ensures ordered                          3.Medical Issues Being Addressed:              Leukopenia - resolved             -- Repeat WBC 5.4 and ANC 1800 on 8/1 - rechecking CBC tonight for trending while on 2 antipsychotics              Low Ceruloplasmin              -- Checking 24 hour urine copper negative; Serum copper WNL at 72   Constipation   -- Continue Miralax and reduce Colace to 132m qd  -- Has Dulcolax PRN X1 if needed  -- encouraged po fluids and fiber   -- change claritin to PRN   EKG change  -- Repeat shows flipped T and Q wave in III - likely lead placement - patient currently asymptomatic   4. Discharge Planning:              -- Social work and case management to assist with discharge planning and identification of hospital follow-up needs prior to discharge             -- Discharge Concerns: Need to establish a safety plan; Medication compliance and effectiveness             -- Discharge Goals: Return home with outpatient referrals for mental health follow-up including medication management/psychotherapy    Total Time Spent in Direct Patient Care:  I personally spent 30 minutes on the unit in direct patient care. The direct patient care time included face-to-face time with the patient, reviewing the patient's chart, communicating with other professionals, and coordinating care. Greater than 50% of this time was spent in counseling or coordinating care with the patient regarding goals of hospitalization, psycho-education, and discharge planning needs.   AHarlow Asa MD, FAPA 07/18/2022, 7:10 AM

## 2022-07-18 NOTE — Progress Notes (Signed)
   Pt's mother, Jaire Pinkham, called for an update on pt.  Mrs. Blasco reported she had learned that pt was not taking his meds.  Advised that pt took his meds tonight and had been seen in the day room sitting with peers.  Advised tht pt is now resting.

## 2022-07-19 ENCOUNTER — Encounter (HOSPITAL_COMMUNITY): Payer: Self-pay

## 2022-07-19 NOTE — Progress Notes (Signed)
Patient took 2 fruit grain bars to his room, unsure if he ate them

## 2022-07-19 NOTE — Progress Notes (Signed)
[facility] MD Progress Note  07/19/2022 7:21 AM Joseph Wilson  MRN:  [id number]  Chief Complaint: Psychosis  Reason for Admission:  Joseph Wilson is a 21 y.o., male with no past psychiatric history who presents to the [facility] from [facility] for evaluation and management of worsening psychosis and paranoia. According to outside records, the patient presented to the behavioral health unit for [place] via [place] police voluntarily as a walk-in with complaint paranoia, auditory and visual hallucinations.The patient is currently on Hospital Day 25.   Chart Review from last 24 hours:  The patient's chart was reviewed and nursing notes were reviewed. The patient's case was discussed in multidisciplinary team meeting. Per nursing, he ate 100% of breakfast and dinner and 50% of lunch and had Ensure and some snacks. He had a controlled nose bleed after picking at nose. He continued to report command AH and delusions but had no acute behavioral issues on the unit. He did not attend groups. Per [facility] he was compliant with meds and did not require PRNs. Per [facility] he attended group this morning.  Information Obtained Today During Patient Interview: The patient was seen in his room. He states that the residual AH are improving. He continues to report that [place] tell him not to eat but he states he is trying to listen to his own voice and rationalize that he needs to eat. He admits he did well eating snacks and meals yesterday. He continues to believe that the [facility] he hears are from "real people" but he is unsure who these people are. He denies any VH. He no longer thinks he is going to be castrated. When asked about belief in thought insertion/withdrawal he states he has this "a little bit" but does not elaborate. He denies belief in thought broadcasting or receiving messages. He continues to believe he has a device implanted in him that allows him to control the atmosphere and to  speak to others but states he is not bothered by this belief. He denies SI, HI and states his mood is "decent." He denies medication side-effects. He had a BM yesterday but thinks he may still be slightly constipated. He was encouraged to fluid hydrate. He voices no other physical complaints. When asked orientation questions he does realize he is in the hospital for mental health concerns and is oriented to month, year and city.  Collateral: Called and talked to father and mother on the phone. Updated them on some of patient's progress in terms of improved compliance with meds and improved eating. Discussed that he has attended a group today and appears to be trying to reality test when confronted with AH telling him not to eat. Discussed potentially over the weekend trying to go up on Haldol with hope of moving toward Haldol as a single agent long-term if improving on med. Discussed that Haldol has LAI option. Time was given for questions.  Sleep: 6.5 hours  Principal Problem: Schizophrenia spectrum disorder with psychotic disorder type not yet determined ([place]) Diagnosis: Principal Problem:   Schizophrenia spectrum disorder with psychotic disorder type not yet determined [facility])  Past Psychiatric History: See H&P  Past Medical History:  Past Medical History:  Diagnosis Date   Acid reflux     Family History:see H&P  Family Psychiatric  History: See H&P  Social History: See H&P  Current Medications: Current Facility-Administered Medications  Medication Dose Route Frequency Provider Last Rate Last Admin   acetaminophen (TYLENOL) tablet 650 mg  650 mg Oral Q6H PRN Rankin, Shuvon B, NP   650 mg at 07/02/22 2052   alum & mag hydroxide-simeth (MAALOX/MYLANTA) 200-200-20 MG/5ML suspension 30 mL  30 mL Oral Q4H PRN Rankin, Shuvon B, NP   30 mL at 07/16/22 0828   benztropine (COGENTIN) tablet 0.5 mg  0.5 mg Oral BID Nicholes Rough, NP   0.5 mg at 07/18/22 2102   bisacodyl (DULCOLAX) EC tablet 10  mg  10 mg Oral Once PRN Harlow Asa, MD       diphenhydrAMINE (BENADRYL) capsule 50 mg  50 mg Oral Q6H PRN Harlow Asa, MD       Or   diphenhydrAMINE (BENADRYL) injection 50 mg  50 mg Intramuscular Q6H PRN Nelda Marseille, Zo Loudon E, MD       docusate sodium (COLACE) capsule 100 mg  100 mg Oral Daily Nelda Marseille, Dalayah Deahl E, MD       feeding supplement (ENSURE ENLIVE / ENSURE PLUS) liquid 237 mL  237 mL Oral BID BM Massengill, Nathan, MD   237 mL at 07/18/22 1451   fluticasone (FLONASE) 50 MCG/ACT nasal spray 1 spray  1 spray Each Nare Daily PRN Bobbitt, Shalon E, NP   1 spray at 07/08/22 2126   haloperidol (HALDOL) tablet 5 mg  5 mg Oral BID Viann Fish E, MD   5 mg at 07/18/22 2102   hydrOXYzine (ATARAX) tablet 25 mg  25 mg Oral TID PRN Dian Situ, MD   25 mg at 07/13/22 2036   loratadine (CLARITIN) tablet 10 mg  10 mg Oral Daily PRN Harlow Asa, MD       OLANZapine zydis (ZYPREXA) disintegrating tablet 5 mg  5 mg Oral Q8H PRN Nelda Marseille, Kimiyah Blick E, MD       And   LORazepam (ATIVAN) tablet 1 mg  1 mg Oral PRN Harlow Asa, MD       And   ziprasidone (GEODON) injection 20 mg  20 mg Intramuscular PRN Nelda Marseille, Sara Selvidge E, MD       magnesium hydroxide (MILK OF MAGNESIA) suspension 30 mL  30 mL Oral Daily PRN Rankin, Shuvon B, NP   30 mL at 07/16/22 0829   OLANZapine zydis (ZYPREXA) disintegrating tablet 10 mg  10 mg Oral Daily Nkwenti, Tamela Oddi, NP   10 mg at 07/18/22 0824   OLANZapine zydis (ZYPREXA) disintegrating tablet 20 mg  20 mg Oral QHS Nkwenti, Doris, NP   20 mg at 07/18/22 2101   polyethylene glycol (MIRALAX / GLYCOLAX) packet 17 g  17 g Oral Daily Nelda Marseille, Sharolyn Weber E, MD   17 g at 07/18/22 0824   propranolol (INDERAL) tablet 10 mg  10 mg Oral BID Nicholes Rough, NP   10 mg at 07/18/22 2101   sodium chloride (OCEAN) 0.65 % nasal spray 1 spray  1 spray Each Nare PRN Harlow Asa, MD       traZODone (DESYREL) tablet 100 mg  100 mg Oral QHS PRN Nicholes Rough, NP   100 mg at 07/13/22 2035    white petrolatum (VASELINE) gel   Topical PRN Harlow Asa, MD        Lab Results:  Results for orders placed or performed during the hospital encounter of 06/24/22 (from the past 48 hour(s))  CBC with Differential/Platelet     Status: None   Collection Time: 07/18/22  6:10 PM  Result Value Ref Range   WBC 6.1 4.0 - 10.5 K/uL   RBC 5.07 4.22 - 5.81 MIL/uL  Hemoglobin 16.1 13.0 - 17.0 g/dL   HCT 47.2 39.0 - 52.0 %   MCV 93.1 80.0 - 100.0 fL   MCH 31.8 26.0 - 34.0 pg   MCHC 34.1 30.0 - 36.0 g/dL   RDW 12.2 11.5 - 15.5 %   Platelets 222 150 - 400 K/uL   nRBC 0.0 0.0 - 0.2 %   Neutrophils Relative % 55 %   Neutro Abs 3.3 1.7 - 7.7 K/uL   Lymphocytes Relative 34 %   Lymphs Abs 2.1 0.7 - 4.0 K/uL   Monocytes Relative 9 %   Monocytes Absolute 0.6 0.1 - 1.0 K/uL   Eosinophils Relative 1 %   Eosinophils Absolute 0.1 0.0 - 0.5 K/uL   Basophils Relative 1 %   Basophils Absolute 0.0 0.0 - 0.1 K/uL   Immature Granulocytes 0 %   Abs Immature Granulocytes 0.01 0.00 - 0.07 K/uL    Comment: Performed at [facility], [place] [address], [place], [place] [address]    Blood Alcohol level:  Lab Results  Component Value Date   ETH <10 66/59/9357    Metabolic Disorder Labs: Lab Results  Component Value Date   HGBA1C 5.1 06/23/2022   MPG 99.67 06/23/2022   No results found for: "PROLACTIN" Lab Results  Component Value Date   CHOL 164 06/23/2022   TRIG 32 06/23/2022   HDL 63 06/23/2022   CHOLHDL 2.6 06/23/2022   VLDL 6 06/23/2022   LDLCALC 95 06/23/2022    Physical Findings: AIMS: Facial and Oral Movements Muscles of Facial Expression: None, normal Lips and Perioral Area: None, normal Jaw: None, normal Tongue: None, normal,Extremity Movements Upper (arms, wrists, hands, fingers): None, normal Lower (legs, knees, ankles, toes): None, normal, Trunk Movements Neck, shoulders, hips: None, normal, Overall Severity Severity of abnormal movements (highest score  from questions above): None, normal Incapacitation due to abnormal movements: None, normal Patient's awareness of abnormal movements (rate only patient's report): No Awareness, Dental Status Current problems with teeth and/or dentures?: No Does patient usually wear dentures?: No   Musculoskeletal: Strength & Muscle Tone: within normal limits Gait & Station: normal Patient leans: N/A  Psychiatric Specialty Exam:  General Appearance: Appears stated age, fairly dressed and groomed  Psychomotor Activity: no tremors or akathisias, no restlessness  Eye Contact: Fair   Speech: responds to direct questions with brief answers, normal fluency  Mood: aloof but calm  Affect: Restricted, less guarded  Thought Process: No thought blocking today, concrete, more linear  Thought Content: Has residual delusion of implanted device, residual AH, no VH, and residual thought insertion/withdrawal but not thought broadcasting; is not grossly responding to internal stimuli on exam; no SI or HI; appears less paranoid on exam  Suicidal Thoughts: Denies SI, intention, plan   Homicidal Thoughts: Denies HI, intention, plan   Alertness/Orientation: Oriented to month, year and city - knows he is in the hospital due to mental health issues  Insight: Limited  Judgment: Improved - complying with meds  Memory: Limited  Executive Functions  Concentration: Fair Attention Span: Fair Recall: Limited Fund of Knowledge: Limited secondary to psychosis  Assets  Assets:Resilience, desire for improvement, social support, housing  Physical Exam Vitals and nursing note reviewed.  Constitutional:      Appearance: Normal appearance.  HENT:     Head: Normocephalic.  Pulmonary:     Effort: Pulmonary effort is normal.  Neurological:     General: No focal deficit present.     Mental Status: He is alert.  Review of Systems  Respiratory:  Negative for shortness of breath.   Cardiovascular:  Negative for  chest pain.  Gastrointestinal:  Positive for constipation. Negative for abdominal pain, diarrhea, nausea and vomiting.  Neurological:  Negative for dizziness and headaches.   Blood pressure 108/64, pulse 98, temperature 98.3 F (36.8 C), temperature source Oral, resp. rate 16, height 6\' 3"  (1.905 m), weight 81.6 kg, SpO2 98 %. Body mass index is 22.5 kg/m.   PLAN: Treatment Plan Summary: Diagnoses / Active Problems: Unspecified schizophrenia spectrum and other psychotic d/o (r/o schizophreniform d/o, r/o schizophrenia)   PLAN: Safety and Monitoring:             -- Voluntary admission to inpatient psychiatric unit for safety, stabilization and treatment             -- Daily contact with patient to assess and evaluate symptoms and progress in treatment             -- Patient's case to be discussed in multi-disciplinary team meeting             -- Observation Level : q15 minute checks             -- Vital signs:  q12 hours             -- Precautions: suicide, elopement, and assault   2. Psychiatric Diagnoses and Treatment:  Unspecified schizophrenia spectrum and other psychotic d/o (r/o schizophreniform d/o, r/o schizophrenia) - Continue Zyprexa Zydis 20mg  qhs and 10mg  qam - partial efficacy - Continue Haldol 5mg  bid for residual psychosis - will try to titrate up over weekend as tolerated (Lipid panel WNL, A1c 5.1, QTc 362ms and on repeat 8/2 367ms, 465ms on 8/7 and 338ms on 8/17) - Continue Cogentin 0.5mg  bid while on 2 antipsychotics - monitoring AIMS and for EPS and constipation - Continue Inderal 10mg  bid for anxiety             -- Continue Vistaril 25mg  tid as needed for anxiety            -- Continue PRN trazodone 100mg  at night for sleep             -- New onset psychosis w/u (Head CT noncontrast negative on 7/24; folate 13.5, B12 578, UDS negative, TSH 2.775, ETOH <10, Mag 2.0, CMP WNL other than anion gap 4, repeat CBC WNL, ESR 0, HIV nonreactive, RPR nonreactive, heavy metal  WNL, ANA negative, Ceruloplasmin low at 13.8, B1 85.7)  -- Encouraging pre-packaged foods and beverages to help with paranoia regarding meals -  Ensures ordered and po intake improving                          3.Medical Issues Being Addressed:              Leukopenia - resolved             -- Repeat WBC 5.4 and ANC 1800 on 8/1 and 6.1 with ANC 3300 on 8/17              Low Ceruloplasmin              -- 24 hour urine copper samples negative X2; Serum copper WNL at 72   Constipation   -- Continue Miralax and reduce Colace to 100mg  qd  -- Has Dulcolax PRN X1 if needed  -- encouraged po fluids and fiber   -- change claritin to PRN  EKG change  -- Repeat shows flipped T and Q wave in III - likely lead placement - patient currently asymptomatic   Nose Bleeds  -- Advised not to pick nose and have order Saline spray and vaseline to use PRN for dryness   4. Discharge Planning:              -- Social work and case management to assist with discharge planning and identification of hospital follow-up needs prior to discharge             -- Discharge Concerns: Need to establish a safety plan; Medication compliance and effectiveness             -- Discharge Goals: Return home with outpatient referrals for mental health follow-up including medication management/psychotherapy    Total Time Spent in Direct Patient Care:  I personally spent 40 minutes on the unit in direct patient care. The direct patient care time included face-to-face time with the patient, reviewing the patient's chart, communicating with other professionals, and coordinating care. Greater than 50% of this time was spent in counseling or coordinating care with the patient regarding goals of hospitalization, psycho-education, and discharge planning needs.   Harlow Asa, MD, FAPA 07/19/2022, 7:21 AM

## 2022-07-19 NOTE — Progress Notes (Signed)
   07/19/22 2010  Psych Admission Type (Psych Patients Only)  Admission Status Involuntary  Psychosocial Assessment  Patient Complaints Worrying  Eye Contact Brief  Facial Expression Flat  Affect Appropriate to circumstance;Depressed  Speech Logical/coherent  Interaction Assertive  Motor Activity Slow  Appearance/Hygiene Unremarkable  Behavior Characteristics Cooperative;Appropriate to situation  Mood Preoccupied;Pleasant  Thought Process  Coherency Circumstantial  Content Preoccupation  Delusions Paranoid  Perception Hallucinations  Hallucination Auditory  Judgment Limited  Confusion None  Danger to Others  Danger to Others None reported or observed

## 2022-07-19 NOTE — BH IP Treatment Plan (Signed)
Interdisciplinary Treatment and Diagnostic Plan Update  07/19/2022 Time of Session: 10:10am  Joseph Wilson MRN: [id number]  Principal Diagnosis: Schizophrenia spectrum disorder with psychotic disorder type not yet determined [facility])  Secondary Diagnoses: Principal Problem:   Schizophrenia spectrum disorder with psychotic disorder type not yet determined (HCC)   Current Medications:  Current Facility-Administered Medications  Medication Dose Route Frequency Provider Last Rate Last Admin   acetaminophen (TYLENOL) tablet 650 mg  650 mg Oral Q6H PRN Rankin, Shuvon B, NP   650 mg at 07/02/22 2052   alum & mag hydroxide-simeth (MAALOX/MYLANTA) 200-200-20 MG/5ML suspension 30 mL  30 mL Oral Q4H PRN Rankin, Shuvon B, NP   30 mL at 07/16/22 0828   benztropine (COGENTIN) tablet 0.5 mg  0.5 mg Oral BID Starleen Blue, NP   0.5 mg at 07/19/22 0813   bisacodyl (DULCOLAX) EC tablet 10 mg  10 mg Oral Once PRN Comer Locket, MD       diphenhydrAMINE (BENADRYL) capsule 50 mg  50 mg Oral Q6H PRN Mason Jim, Amy E, MD       Or   diphenhydrAMINE (BENADRYL) injection 50 mg  50 mg Intramuscular Q6H PRN Mason Jim, Amy E, MD       docusate sodium (COLACE) capsule 100 mg  100 mg Oral Daily Mason Jim, Amy E, MD   100 mg at 07/19/22 0086   feeding supplement (ENSURE ENLIVE / ENSURE PLUS) liquid 237 mL  237 mL Oral BID BM Massengill, Harrold Donath, MD   237 mL at 07/19/22 1007   fluticasone (FLONASE) 50 MCG/ACT nasal spray 1 spray  1 spray Each Nare Daily PRN Bobbitt, Shalon E, NP   1 spray at 07/08/22 2126   haloperidol (HALDOL) tablet 5 mg  5 mg Oral BID Bartholomew Crews E, MD   5 mg at 07/19/22 0813   hydrOXYzine (ATARAX) tablet 25 mg  25 mg Oral TID PRN Sarita Bottom, MD   25 mg at 07/13/22 2036   loratadine (CLARITIN) tablet 10 mg  10 mg Oral Daily PRN Comer Locket, MD       OLANZapine zydis (ZYPREXA) disintegrating tablet 5 mg  5 mg Oral Q8H PRN Mason Jim, Amy E, MD       And   LORazepam (ATIVAN) tablet 1 mg  1  mg Oral PRN Comer Locket, MD       And   ziprasidone (GEODON) injection 20 mg  20 mg Intramuscular PRN Mason Jim, Amy E, MD       magnesium hydroxide (MILK OF MAGNESIA) suspension 30 mL  30 mL Oral Daily PRN Rankin, Shuvon B, NP   30 mL at 07/16/22 0829   OLANZapine zydis (ZYPREXA) disintegrating tablet 10 mg  10 mg Oral Daily Nkwenti, Doris, NP   10 mg at 07/19/22 0813   OLANZapine zydis (ZYPREXA) disintegrating tablet 20 mg  20 mg Oral QHS Nkwenti, Doris, NP   20 mg at 07/18/22 2101   polyethylene glycol (MIRALAX / GLYCOLAX) packet 17 g  17 g Oral Daily Mason Jim, Amy E, MD   17 g at 07/19/22 0812   propranolol (INDERAL) tablet 10 mg  10 mg Oral BID Starleen Blue, NP   10 mg at 07/19/22 7619   sodium chloride (OCEAN) 0.65 % nasal spray 1 spray  1 spray Each Nare PRN Comer Locket, MD       traZODone (DESYREL) tablet 100 mg  100 mg Oral QHS PRN Starleen Blue, NP   100 mg at 07/13/22 2035  white petrolatum (VASELINE) gel   Topical PRN Comer Locket, MD       PTA Medications: Medications Prior to Admission  Medication Sig Dispense Refill Last Dose   ASHWAGANDHA PO Take 1 capsule by mouth in the morning and at bedtime.      hydrOXYzine (ATARAX) 25 MG tablet Take 1 tablet (25 mg total) by mouth 3 (three) times daily as needed for anxiety. 30 tablet 0    Multiple Vitamins-Minerals (ADULT GUMMY PO) Take 1 tablet by mouth daily.      neomycin-bacitracin-polymyxin (NEOSPORIN) [phone] ointment Apply 1 Application topically daily as needed (Apply to affected area).      OLANZapine zydis (ZYPREXA) 5 MG disintegrating tablet Take 1 tablet (5 mg total) by mouth at bedtime.      traZODone (DESYREL) 50 MG tablet Take 1 tablet (50 mg total) by mouth at bedtime as needed for sleep.       Patient Stressors: Other: psychosis    Patient Strengths: Average or above average intelligence  Communication skills  Motivation for treatment/growth  Physical Health  Religious Affiliation  Supportive  family/friends   Treatment Modalities: Medication Management, Group therapy, Case management,  1 to 1 session with clinician, Psychoeducation, Recreational therapy.   Physician Treatment Plan for Primary Diagnosis: Schizophrenia spectrum disorder with psychotic disorder type not yet determined (HCC) Long Term Goal(s): Improvement in symptoms so as ready for discharge   Short Term Goals: Ability to identify changes in lifestyle to reduce recurrence of condition will improve Ability to verbalize feelings will improve Ability to disclose and discuss suicidal ideas Ability to demonstrate self-control will improve  Medication Management: Evaluate patient's response, side effects, and tolerance of medication regimen.  Therapeutic Interventions: 1 to 1 sessions, Unit Group sessions and Medication administration.  Evaluation of Outcomes: Progressing  Physician Treatment Plan for Secondary Diagnosis: Principal Problem:   Schizophrenia spectrum disorder with psychotic disorder type not yet determined (HCC)  Long Term Goal(s): Improvement in symptoms so as ready for discharge   Short Term Goals: Ability to identify changes in lifestyle to reduce recurrence of condition will improve Ability to verbalize feelings will improve Ability to disclose and discuss suicidal ideas Ability to demonstrate self-control will improve     Medication Management: Evaluate patient's response, side effects, and tolerance of medication regimen.  Therapeutic Interventions: 1 to 1 sessions, Unit Group sessions and Medication administration.  Evaluation of Outcomes: Progressing   RN Treatment Plan for Primary Diagnosis: Schizophrenia spectrum disorder with psychotic disorder type not yet determined (HCC) Long Term Goal(s): Knowledge of disease and therapeutic regimen to maintain health will improve  Short Term Goals: Ability to remain free from injury will improve, Ability to participate in decision making will  improve, Ability to verbalize feelings will improve, Ability to disclose and discuss suicidal ideas, and Ability to identify and develop effective coping behaviors will improve  Medication Management: RN will administer medications as ordered by provider, will assess and evaluate patient's response and provide education to patient for prescribed medication. RN will report any adverse and/or side effects to prescribing provider.  Therapeutic Interventions: 1 on 1 counseling sessions, Psychoeducation, Medication administration, Evaluate responses to treatment, Monitor vital signs and CBGs as ordered, Perform/monitor CIWA, COWS, AIMS and Fall Risk screenings as ordered, Perform wound care treatments as ordered.  Evaluation of Outcomes: Progressing   LCSW Treatment Plan for Primary Diagnosis: Schizophrenia spectrum disorder with psychotic disorder type not yet determined (HCC) Long Term Goal(s): Safe transition to appropriate next level  of care at discharge, Engage patient in therapeutic group addressing interpersonal concerns.  Short Term Goals: Engage patient in aftercare planning with referrals and resources, Increase social support, Increase emotional regulation, Facilitate acceptance of mental health diagnosis and concerns, Identify triggers associated with mental health/substance abuse issues, and Increase skills for wellness and recovery  Therapeutic Interventions: Assess for all discharge needs, 1 to 1 time with Social worker, Explore available resources and support systems, Assess for adequacy in community support network, Educate family and significant other(s) on suicide prevention, Complete Psychosocial Assessment, Interpersonal group therapy.  Evaluation of Outcomes: Progressing   Progress in Treatment: Attending groups: Yes. Participating in groups: Yes. Taking medication as prescribed: Yes. Toleration medication: Yes. Family/Significant other contact made: Yes, individual(s)  contacted:  [place]ty[place] (father) [phone] Patient understands diagnosis: Yes. Discussing patient identified problems/goals with staff: Yes. Medical problems stabilized or resolved: Yes. Denies suicidal/homicidal ideation: Yes. Issues/concerns per patient self-inventory: Yes. Other: none   New problem(s) identified: No, Describe:  none   New Short Term/Long Term Goal(s): Patient to work towards elimination of symptoms of psychosis, medication management for mood stabilization\; development of comprehensive mental wellness plan.   Patient Goals:  No additional goals identified at this time. Patient to continue to work towards original goals identified in initial treatment team meeting. CSW will remain available to patient should they voice additional treatment goals.    Discharge Plan or Barriers: No psychosocial barriers identified at this time, patient to return to place of residence when appropriate for discharge.    Reason for Continuation of Hospitalization: Delusions    Estimated Length of Stay: 1-7 days      Last 3 [place] Suicide Severity Risk Score: Flowsheet Row [facility]Current[facility]2022 in [facility]  C-SSRS RISK CATEGORY No Risk No Risk       Last PHQ 2/9 Scores:    06/23/2022    9:44 AM  Depression screen PHQ 2/9  Decreased Interest 0  Down, Depressed, Hopeless 0  PHQ - 2 Score 0    Scribe for Treatment Team: Aram Beecham, Theresia Majors 07/19/2022 10:47 AM

## 2022-07-19 NOTE — Progress Notes (Signed)
Adult Psychoeducational Group Note  Date:  07/19/2022 Time:  10:48 AM  Group Topic/Focus:  Orientation:   The focus of this group is to educate the patient on the purpose and policies of crisis stabilization and provide a format to answer questions about their admission.  The group details unit policies and expectations of patients while admitted.  Participation Level:  Active  Participation Quality:  Appropriate  Affect:  Appropriate  Cognitive:  Appropriate  Insight: Appropriate  Engagement in Group:  Engaged  Modes of Intervention:  Discussion  Additional Comments:  Patient attended morning orientation group and participated.   Joseph Wilson W Derik Fults 07/19/2022, 10:48 AM

## 2022-07-19 NOTE — Progress Notes (Signed)
   Pt reported eating 2 bags of chips and 1 cup of pudding during snack time tonight.

## 2022-07-19 NOTE — Plan of Care (Signed)
  Problem: Education: Goal: Emotional status will improve Outcome: Not Progressing Goal: Mental status will improve Outcome: Not Progressing Goal: Verbalization of understanding the information provided will improve Outcome: Not Progressing   

## 2022-07-19 NOTE — Progress Notes (Signed)
   07/19/22 2010  Psych Admission Type (Psych Patients Only)  Admission Status Involuntary  Psychosocial Assessment  Patient Complaints Worrying  Eye Contact Brief  Facial Expression Flat  Affect Appropriate to circumstance;Depressed  Speech Logical/coherent  Interaction Assertive  Motor Activity Slow  Appearance/Hygiene Unremarkable  Behavior Characteristics Cooperative;Appropriate to situation  Mood Preoccupied;Pleasant  Thought Process  Coherency Circumstantial  Content Preoccupation  Delusions Paranoid  Perception Hallucinations  Hallucination Auditory  Judgment Limited  Confusion None  Danger to Others  Danger to Others None reported or observed  Danger to Others Abnormal  Harmful Behavior to others No threats or harm toward other people

## 2022-07-19 NOTE — Plan of Care (Signed)

## 2022-07-19 NOTE — Progress Notes (Signed)
The focus of this group is to help patients review their daily goal of treatment and discuss progress on daily workbooks.  Pt attended the evening group and responded to all discussion prompts from the Writer. Pt shared that today was a good day on the unit, the highlight of which was making a friend at lunch. "We talked about a lot of stuff. He got me. It was cool."  Pt told that, upon discharge, he planned to stay well by "being appreciative and grateful of my blessings every single day." [name] then explained how he felt he'd started to take his good things for granted.  Pt rated his day a 9 out of 10 and his affect was appropriate.

## 2022-07-19 NOTE — Group Note (Signed)
LCSW Group Therapy Note   Group Date: 07/19/2022 Start Time: 1300 End Time: 1400  Type of Therapy and Topic:  Group Therapy - Healthy vs Unhealthy Coping Skills  Participation Level:  Active   Description of Group The focus of this group was to determine what unhealthy coping techniques typically are used by group members and what healthy coping techniques would be helpful in coping with various problems. Patients were guided in becoming aware of the differences between healthy and unhealthy coping techniques. Patients were asked to identify 2-3 healthy coping skills they would like to learn to use more effectively.  Therapeutic Goals Patients learned that coping is what human beings do all day long to deal with various situations in their lives Patients defined and discussed healthy vs unhealthy coping techniques Patients identified their preferred coping techniques and identified whether these were healthy or unhealthy Patients determined 2-3 healthy coping skills they would like to become more familiar with and use more often. Patients provided support and ideas to each other   Summary of Patient Progress:  During group, patient expressed that the coping skill that they like to use is spending time in nature.  Pt actively engaged in identifying unhealthy coping mechanisms they have utilized in the past.  Pt actively engaged in processing means of coping and what outcomes occur from such methods. Pt further engaged in discussion, identifying healthy coping mechanisms they have used in the past.  Pt engaged in processing the use of healthier mechanisms and how these produce different gains to unhealthy mechanisms. Pt actively identified other coping mechanisms they would be willing to try in the future.  Pt proved receptive of alternate group members input and feedback from CSW.   Therapeutic Modalities Cognitive Behavioral Therapy Motivational Interviewing  Aram Beecham,  [place] 07/19/2022  1:33 PM

## 2022-07-20 MED ORDER — HALOPERIDOL 5 MG PO TABS
5.0000 mg | ORAL_TABLET | Freq: Every day | ORAL | Status: DC
Start: 2022-07-21 — End: 2022-07-26
  Administered 2022-07-21 – 2022-07-26 (×6): 5 mg via ORAL
  Filled 2022-07-20 (×8): qty 1

## 2022-07-20 MED ORDER — HALOPERIDOL 5 MG PO TABS
7.5000 mg | ORAL_TABLET | Freq: Every day | ORAL | Status: DC
  Administered 2022-07-20 – 2022-07-21 (×2): 7.5 mg via ORAL
  Filled 2022-07-20 (×3): qty 1

## 2022-07-20 NOTE — Progress Notes (Signed)
   07/20/22 0900  Psych Admission Type (Psych Patients Only)  Admission Status Involuntary  Psychosocial Assessment  Patient Complaints None  Eye Contact Fair  Facial Expression Flat  Affect Appropriate to circumstance  Speech Logical/coherent  Interaction Assertive  Motor Activity Slow  Appearance/Hygiene Unremarkable  Behavior Characteristics Cooperative;Appropriate to situation  Mood Pleasant  Thought Process  Coherency Circumstantial  Content Preoccupation  Delusions Paranoid  Perception Hallucinations  Hallucination Auditory  Judgment Impaired  Confusion None  Danger to Self  Current suicidal ideation? Denies  Danger to Others  Danger to Others None reported or observed  Danger to Others Abnormal  Harmful Behavior to others No threats or harm toward other people  Destructive Behavior No threats or harm toward property

## 2022-07-20 NOTE — Progress Notes (Signed)
Patient ate all three of his evening snacks and had fluids to drink.

## 2022-07-20 NOTE — Group Note (Signed)
  [facility] LCSW Group Therapy Note  Date/Time:  07/20/2022 10:00am-11:00am or 11:00am-12:00pm  Type of Therapy and Topic:  Group Therapy:  Self-Care after Hospitalization  Participation Level:  Active   Description of Group This process group involved patients discussing how they plan to take care of themselves in a better manner when they get home from the hospital.  The group started with patients listing one healthy self-care they hope to engage in at discharge that they did not use prior to admission.  We discussed a variety of other means of self-care which had a large range from hygiene activities to eating to setting boundaries to participating in peer support groups.  The primary focus by CSW was to point out commonalities.  When there were participants who stated everyone else can get help, but they themselves are "beyond help," this was discussed in detail and this belief was gently challenged.  Finally, it was announced that immediately following group was to be "Hygiene Hour" where everyone would go to their rooms and take care of their personal hygiene.  This was met with wide acceptance.  Therapeutic Goals Patient will identify and describe one self-care activity to deliberately plan to use upon hospital discharge Patient will participate in generating additional ideas about healthy self-care options when they return to the community Patients will be supportive of one another and receive support from others Patients will be challenged to realize that they are not "beyond help" any more than other participants in the room are  Summary of Patient Progress:  The patient expressed that one self-care activity he hopes to use post-discharge is go on more walks and get more exercise because he thinks exercise is good for you.  Additionally, the patient interrupted group at one point to state that everyone needs to watch out for those who want to make [place] sin.   Therapeutic Modalities Brief  Solution-Focused Therapy Psychoeducation   Selmer Dominion, LCSW 07/20/2022, 4:34 PM

## 2022-07-20 NOTE — [facility] Group Notes (Signed)
Adult Psychoeducational Group Note  Date:  07/20/2022 Time:  9:26 AM  Group Topic/Focus:  Goals Group:   The focus of this group is to help patients establish daily goals to achieve during treatment and discuss how the patient can incorporate goal setting into their daily lives to aide in recovery.  Participation Level:  Did Not Attend    Joseph Wilson 07/20/2022, 9:26 AM

## 2022-07-20 NOTE — Progress Notes (Signed)
The patient rated his day as a 8 out of 10 since he feels that the medication is working. He learned today that he can meet new people and make friends. His coping skill to be used outside of the hospital is to "walk back and forth".

## 2022-07-20 NOTE — Progress Notes (Signed)
[facility] MD Progress Note  07/20/2022 6:50 AM Joseph Wilson  MRN:  [id number]  Chief Complaint: Psychosis  Reason for Admission:  Joseph Wilson is a 21 y.o., male with no past psychiatric history who presents to the [facility] from [facility] for evaluation and management of worsening psychosis and paranoia. According to outside records, the patient presented to the behavioral health unit for [place] via [place] police voluntarily as a walk-in with complaint paranoia, auditory and visual hallucinations.The patient is currently on Hospital Day 26.   Chart Review from last 24 hours:  The patient's chart was reviewed and nursing notes were reviewed. The patient's case was discussed in multidisciplinary team meeting. Per nursing, he has been eating and did attend groups. He had no acute behavioral issues noted. Per [facility] he was compliant with scheduled medications and did not require PRNS.  Information Obtained Today During Patient Interview: The patient was seen in his room. He admits he can tell that he is getting some better and reports that his residual AH are reduced in terms of frequency and intensity. He states the voices still tell him not to eat but he is trying to ignore the voices. He states he ate most of his 3 meals yesterday and some snacks but is worried that we may think he needs a feeding tube. He was provided reassurance that this is not the case and he was encouraged to continue attempts at good po intake. He denies SI, HI or VH today. He states that yesterday he thought he saw something "fly out of the TV" but is not getting messages from TV. He continues to believe he is implanted with a device that allows him to talk to nature and hear others. He states "I feel more clear headed" when asked about his mood. He admits he has been able to attend groups and reports improved paranoia. He states he still thinks he is broadcasting his thoughts and that  others may be taking thoughts in or out of his head but this belief is improved compared to intensity to beliefs several days ago. He voices no current physical complaints and states his constipation is improved. We discussed dose titration up on Haldol given his interval improvement and he is in agreement with this plan.  Sleep: Total hours not recorded  Principal Problem: Schizophrenia spectrum disorder with psychotic disorder type not yet determined ([place]) Diagnosis: Principal Problem:   Schizophrenia spectrum disorder with psychotic disorder type not yet determined [facility])  Past Psychiatric History: See H&P  Past Medical History:  Past Medical History:  Diagnosis Date   Acid reflux     Family History:see H&P  Family Psychiatric  History: See H&P  Social History: See H&P  Current Medications: Current Facility-Administered Medications  Medication Dose Route Frequency Provider Last Rate Last Admin   acetaminophen (TYLENOL) tablet 650 mg  650 mg Oral Q6H PRN Rankin, Shuvon B, NP   650 mg at 07/02/22 2052   alum & mag hydroxide-simeth (MAALOX/MYLANTA) 200-200-20 MG/5ML suspension 30 mL  30 mL Oral Q4H PRN Rankin, Shuvon B, NP   30 mL at 07/16/22 0828   benztropine (COGENTIN) tablet 0.5 mg  0.5 mg Oral BID Nicholes Rough, NP   0.5 mg at 07/19/22 2009   bisacodyl (DULCOLAX) EC tablet 10 mg  10 mg Oral Once PRN Harlow Asa, MD       diphenhydrAMINE (BENADRYL) capsule 50 mg  50 mg Oral Q6H PRN Nelda Marseille, Dontai Pember E,  MD       Or   diphenhydrAMINE (BENADRYL) injection 50 mg  50 mg Intramuscular Q6H PRN Nelda Marseille, Lenzie Montesano E, MD       docusate sodium (COLACE) capsule 100 mg  100 mg Oral Daily Nelda Marseille, Javid Kemler E, MD   100 mg at 07/19/22 3159   feeding supplement (ENSURE ENLIVE / ENSURE PLUS) liquid 237 mL  237 mL Oral BID BM Massengill, Ovid Curd, MD   237 mL at 07/19/22 1458   fluticasone (FLONASE) 50 MCG/ACT nasal spray 1 spray  1 spray Each Nare Daily PRN Bobbitt, Shalon E, NP   1 spray at 07/08/22  2126   haloperidol (HALDOL) tablet 5 mg  5 mg Oral BID Harlow Asa, MD   5 mg at 07/19/22 2009   hydrOXYzine (ATARAX) tablet 25 mg  25 mg Oral TID PRN Dian Situ, MD   25 mg at 07/13/22 2036   loratadine (CLARITIN) tablet 10 mg  10 mg Oral Daily PRN Harlow Asa, MD       OLANZapine zydis (ZYPREXA) disintegrating tablet 5 mg  5 mg Oral Q8H PRN Harlow Asa, MD       And   LORazepam (ATIVAN) tablet 1 mg  1 mg Oral PRN Harlow Asa, MD       And   ziprasidone (GEODON) injection 20 mg  20 mg Intramuscular PRN Nelda Marseille, Maira Christon E, MD       magnesium hydroxide (MILK OF MAGNESIA) suspension 30 mL  30 mL Oral Daily PRN Rankin, Shuvon B, NP   30 mL at 07/16/22 0829   OLANZapine zydis (ZYPREXA) disintegrating tablet 10 mg  10 mg Oral Daily Nicholes Rough, NP   10 mg at 07/19/22 0813   OLANZapine zydis (ZYPREXA) disintegrating tablet 20 mg  20 mg Oral QHS Nkwenti, Doris, NP   20 mg at 07/19/22 2009   polyethylene glycol (MIRALAX / GLYCOLAX) packet 17 g  17 g Oral Daily Nelda Marseille, Bethani Brugger E, MD   17 g at 07/19/22 4585   propranolol (INDERAL) tablet 10 mg  10 mg Oral BID Nicholes Rough, NP   10 mg at 07/19/22 2009   sodium chloride (OCEAN) 0.65 % nasal spray 1 spray  1 spray Each Nare PRN Harlow Asa, MD       traZODone (DESYREL) tablet 100 mg  100 mg Oral QHS PRN Nicholes Rough, NP   100 mg at 07/13/22 2035   white petrolatum (VASELINE) gel   Topical PRN Harlow Asa, MD        Lab Results:  Results for orders placed or performed during the hospital encounter of 06/24/22 (from the past 48 hour(s))  CBC with Differential/Platelet     Status: None   Collection Time: 07/18/22  6:10 PM  Result Value Ref Range   WBC 6.1 4.0 - 10.5 K/uL   RBC 5.07 4.22 - 5.81 MIL/uL   Hemoglobin 16.1 13.0 - 17.0 g/dL   HCT 47.2 39.0 - 52.0 %   MCV 93.1 80.0 - 100.0 fL   MCH 31.8 26.0 - 34.0 pg   MCHC 34.1 30.0 - 36.0 g/dL   RDW 12.2 11.5 - 15.5 %   Platelets 222 150 - 400 K/uL   nRBC 0.0 0.0 -  0.2 %   Neutrophils Relative % 55 %   Neutro Abs 3.3 1.7 - 7.7 K/uL   Lymphocytes Relative 34 %   Lymphs Abs 2.1 0.7 - 4.0 K/uL   Monocytes Relative 9 %  Monocytes Absolute 0.6 0.1 - 1.0 K/uL   Eosinophils Relative 1 %   Eosinophils Absolute 0.1 0.0 - 0.5 K/uL   Basophils Relative 1 %   Basophils Absolute 0.0 0.0 - 0.1 K/uL   Immature Granulocytes 0 %   Abs Immature Granulocytes 0.01 0.00 - 0.07 K/uL    Comment: Performed at [facility], [place] [address] [place], [place] [address]    Blood Alcohol level:  Lab Results  Component Value Date   ETH <10 58/83/2549    Metabolic Disorder Labs: Lab Results  Component Value Date   HGBA1C 5.1 06/23/2022   MPG 99.67 06/23/2022   No results found for: "PROLACTIN" Lab Results  Component Value Date   CHOL 164 06/23/2022   TRIG 32 06/23/2022   HDL 63 06/23/2022   CHOLHDL 2.6 06/23/2022   VLDL 6 06/23/2022   LDLCALC 95 06/23/2022    Physical Findings: AIMS: Facial and Oral Movements Muscles of Facial Expression: None, normal Lips and Perioral Area: None, normal Jaw: None, normal Tongue: None, normal,Extremity Movements Upper (arms, wrists, hands, fingers): None, normal Lower (legs, knees, ankles, toes): None, normal, Trunk Movements Neck, shoulders, hips: None, normal, Overall Severity Severity of abnormal movements (highest score from questions above): None, normal Incapacitation due to abnormal movements: None, normal Patient's awareness of abnormal movements (rate only patient's report): No Awareness, Dental Status Current problems with teeth and/or dentures?: No Does patient usually wear dentures?: No   Musculoskeletal: Strength & Muscle Tone: within normal limits Gait & Station: normal Patient leans: N/A  Psychiatric Specialty Exam:  General Appearance: Appears stated age, fairly dressed and groomed  Psychomotor Activity: no tremors or akathisias, no restlessness, no cogwheeling or stiffness,  AIMS 0  Eye Contact: Fair   Speech: responds to direct questions with brief answers, normal fluency  Mood: described as "okay"   Affect: can smile briefly today, less guarded but still restricted  Thought Process: Concrete, more linear with only occasional ruminations about possible feeding tube which improves with reassurance  Thought Content/Perceptions: Has residual delusion of implanted device, residual AH that are reportedly improving, VH yesterday but none today; no ideas of reference, and improving report of thought insertion/withdrawal/broadcasting; has some ruminations about need for a feeding tube; is not grossly responding to internal stimuli on exam; no SI or HI; appears less paranoid on exam  Suicidal Thoughts: Denies SI, intention, plan   Homicidal Thoughts: Denies HI, intention, plan   Alertness/Orientation: Oriented to month, year and city - knows he is in the hospital due to mental health issues  Insight: Limited  Judgment: Improved - complying with meds  Memory: Limited  Executive Functions  Concentration: Fair Attention Span: Fair Recall: Limited Fund of Knowledge: Limited secondary to psychosis  Assets  Assets:Resilience, desire for improvement, social support, housing  Physical Exam Vitals and nursing note reviewed.  Constitutional:      Appearance: Normal appearance.  HENT:     Head: Normocephalic.  Pulmonary:     Effort: Pulmonary effort is normal.  Neurological:     General: No focal deficit present.     Mental Status: He is alert.    Review of Systems  Gastrointestinal:  Negative for abdominal pain, constipation, diarrhea, nausea and vomiting.  Neurological:  Negative for dizziness and headaches.   Blood pressure 109/74, pulse 89, temperature 98.2 F (36.8 C), temperature source Oral, resp. rate 16, height '6\' 3"'  (1.905 m), weight 81.6 kg, SpO2 99 %. Body mass index is 22.5 kg/m.  PLAN: Treatment Plan Summary: Diagnoses / Active  Problems: Unspecified schizophrenia spectrum and other psychotic d/o (r/o schizophreniform d/o, r/o schizophrenia)   PLAN: Safety and Monitoring:             -- Voluntary admission to inpatient psychiatric unit for safety, stabilization and treatment             -- Daily contact with patient to assess and evaluate symptoms and progress in treatment             -- Patient's case to be discussed in multi-disciplinary team meeting             -- Observation Level : q15 minute checks             -- Vital signs:  q12 hours             -- Precautions: suicide, elopement, and assault   2. Psychiatric Diagnoses and Treatment:  Unspecified schizophrenia spectrum and other psychotic d/o (r/o schizophreniform d/o, r/o schizophrenia) - Continue Zyprexa Zydis 48m qhs and 1415mqam - partial efficacy - Increase Haldol to 15m51mam and 7.15mg415ms for residual psychosis  (Lipid panel WNL, A1c 5.1, QTc 370ms14m on repeat 8/2 393ms,86mms o69m7 and 391ms on22m7) - Continue Cogentin 0.15mg bid 63mle on 2 antipsychotics - monitoring AIMS and for EPS and constipation - Continue Inderal 10mg bid 81manxiety             -- Continue Vistaril 215mg tid [id number] for anxiety            -- Continue PRN trazodone 100mg at nig31mor sleep             -- New onset psychosis w/u (Head CT noncontrast negative on 7/24; folate 13.5, B12 578, UDS negative, TSH 2.775, ETOH <10, Mag 2.0, CMP WNL other than anion gap 4, repeat CBC WNL, ESR 0, HIV nonreactive, RPR nonreactive, heavy metal WNL, ANA negative, Ceruloplasmin low at 13.8, B1 85.7)  -- Encouraging pre-packaged foods and beverages to help with paranoia regarding meals -  Ensures ordered and po intake improving                          3.Medical Issues Being Addressed:              Leukopenia - resolved             -- Repeat WBC 5.4 and ANC 1800 on 8/1 and 6.1 with ANC 3300 on 8/17              Low Ceruloplasmin              -- 24 hour urine copper samples negative X2;  Serum copper WNL at 72   Constipation   -- Continue Miralax qd and  Colace 100mg qd  -- 6mDulcolax PRN X1 if needed  -- encouraged po fluids and fiber   -- changed claritin to PRN   EKG change  -- Repeat shows flipped T and Q wave in III - likely lead placement - patient currently asymptomatic   Nose Bleeds  -- Advised not to pick nose and have order Saline spray and vaseline to use PRN for dryness   4. Discharge Planning:              -- Social work and case management to assist with discharge planning and identification of hospital follow-up needs prior to  discharge             -- Discharge Concerns: Need to establish a safety plan; Medication compliance and effectiveness             -- Discharge Goals: Return home with outpatient referrals for mental health follow-up including medication management/psychotherapy    Total Time Spent in Direct Patient Care:  I personally spent 25 minutes on the unit in direct patient care. The direct patient care time included face-to-face time with the patient, reviewing the patient's chart, communicating with other professionals, and coordinating care. Greater than 50% of this time was spent in counseling or coordinating care with the patient regarding goals of hospitalization, psycho-education, and discharge planning needs.   Harlow Asa, MD, FAPA 07/20/2022, 6:50 AM

## 2022-07-21 NOTE — Progress Notes (Signed)
[facility] MD Progress Note  07/21/2022 7:34 AM Joseph Wilson  MRN:  [id number]  Chief Complaint: Psychosis  Reason for Admission:  Joseph Wilson is a 21 y.o., male with no past psychiatric history who presents to the [facility] from [facility] for evaluation and management of worsening psychosis and paranoia. According to outside records, the patient presented to the behavioral health unit for [place] via [place] police voluntarily as a walk-in with complaint paranoia, auditory and visual hallucinations.The patient is currently on Hospital Day 27.   Chart Review from last 24 hours:  The patient's chart was reviewed and nursing notes were reviewed. The patient's case was discussed in multidisciplinary team meeting. Per nursing, he attended all but 1 group. Per SW group note he did have some hyper-religious comments in group but did engage. He was noted to eat snacks and drink fluids. He had not acute behavioral issues noted. Per [facility] he was compliant with scheduled medications and did receive Trazodone X1 PRN.  Information Obtained Today During Patient Interview: The patient was seen in his room. He reportedly told nurses he had no AH today, but when questioned further, states they are greatly reduced and he is able to ignore them more easily. He does not recognize the residual AH today and feels the voices "are much better." He continues to believe he is implanted with a device and states "when I flex my brain mist comes out." He believes the mist is representation of demons leaving his body and he reports belief he is "anointed like Eddie Dibbles." He states when he watches TV he has had concern that the actors on TV do not want him to watch the program due to the devil influencing the actors. He denies any thought broadcasting. He states his belief in thought insertion/withdrawal by the "NBA and false apostles" are improving. He states he still feels controlled at times  but was challenged to work on reality testing. He states he was worried that through "witchcraft" things were being put in his food earlier during admission but denies any recent VH and states he has been eating well in recent days. He reports good appetite and resolution of constipation. He states he feels a little sleepy but denies other medication side-effects. He has been attending groups. He states he is clearing "coping skills" in group. He denies SI or HI. He states he has intermittent CP and left lower abdominal pains that come and go but these are vague and nonspecific in his descriptions.  Sleep: 7.75 hours  Principal Problem: Schizophrenia spectrum disorder with psychotic disorder type not yet determined ([place]) Diagnosis: Principal Problem:   Schizophrenia spectrum disorder with psychotic disorder type not yet determined [facility])  Past Psychiatric History: See H&P  Past Medical History:  Past Medical History:  Diagnosis Date   Acid reflux     Family History:see H&P  Family Psychiatric  History: See H&P  Social History: See H&P  Current Medications: Current Facility-Administered Medications  Medication Dose Route Frequency Provider Last Rate Last Admin   acetaminophen (TYLENOL) tablet 650 mg  650 mg Oral Q6H PRN Rankin, Shuvon B, NP   650 mg at 07/02/22 2052   alum & mag hydroxide-simeth (MAALOX/MYLANTA) 200-200-20 MG/5ML suspension 30 mL  30 mL Oral Q4H PRN Rankin, Shuvon B, NP   30 mL at 07/16/22 0828   benztropine (COGENTIN) tablet 0.5 mg  0.5 mg Oral BID Nicholes Rough, NP   0.5 mg at 07/20/22 2117  bisacodyl (DULCOLAX) EC tablet 10 mg  10 mg Oral Once PRN Harlow Asa, MD       diphenhydrAMINE (BENADRYL) capsule 50 mg  50 mg Oral Q6H PRN Harlow Asa, MD       Or   diphenhydrAMINE (BENADRYL) injection 50 mg  50 mg Intramuscular Q6H PRN Nelda Marseille, Wiatt Mahabir E, MD       docusate sodium (COLACE) capsule 100 mg  100 mg Oral Daily Nelda Marseille, Nolan Tuazon E, MD   100 mg at 07/20/22  0941   feeding supplement (ENSURE ENLIVE / ENSURE PLUS) liquid 237 mL  237 mL Oral BID BM Massengill, Ovid Curd, MD   237 mL at 07/20/22 1414   fluticasone (FLONASE) 50 MCG/ACT nasal spray 1 spray  1 spray Each Nare Daily PRN Bobbitt, Shalon E, NP   1 spray at 07/08/22 2126   haloperidol (HALDOL) tablet 5 mg  5 mg Oral Daily Nelda Marseille, Natania Finigan E, MD       haloperidol (HALDOL) tablet 7.5 mg  7.5 mg Oral QHS Nelda Marseille, Verda Mehta E, MD   7.5 mg at 07/20/22 2116   hydrOXYzine (ATARAX) tablet 25 mg  25 mg Oral TID PRN Dian Situ, MD   25 mg at 07/13/22 2036   loratadine (CLARITIN) tablet 10 mg  10 mg Oral Daily PRN Harlow Asa, MD       OLANZapine zydis (ZYPREXA) disintegrating tablet 5 mg  5 mg Oral Q8H PRN Nelda Marseille, Cristi Gwynn E, MD       And   LORazepam (ATIVAN) tablet 1 mg  1 mg Oral PRN Harlow Asa, MD       And   ziprasidone (GEODON) injection 20 mg  20 mg Intramuscular PRN Nelda Marseille, Matheus Spiker E, MD       magnesium hydroxide (MILK OF MAGNESIA) suspension 30 mL  30 mL Oral Daily PRN Rankin, Shuvon B, NP   30 mL at 07/16/22 0829   OLANZapine zydis (ZYPREXA) disintegrating tablet 10 mg  10 mg Oral Daily Nkwenti, Tamela Oddi, NP   10 mg at 07/20/22 0941   OLANZapine zydis (ZYPREXA) disintegrating tablet 20 mg  20 mg Oral QHS Nkwenti, Doris, NP   20 mg at 07/20/22 2117   polyethylene glycol (MIRALAX / GLYCOLAX) packet 17 g  17 g Oral Daily Nelda Marseille, Luiza Carranco E, MD   17 g at 07/20/22 0941   propranolol (INDERAL) tablet 10 mg  10 mg Oral BID Nicholes Rough, NP   10 mg at 07/20/22 2116   sodium chloride (OCEAN) 0.65 % nasal spray 1 spray  1 spray Each Nare PRN Harlow Asa, MD       traZODone (DESYREL) tablet 100 mg  100 mg Oral QHS PRN Nicholes Rough, NP   100 mg at 07/20/22 2116   white petrolatum (VASELINE) gel   Topical PRN Harlow Asa, MD        Lab Results: No results found for this or any previous visit (from the past 73 hour(s)).   Blood Alcohol level:  Lab Results  Component Value Date   ETH <10  30/94/0768    Metabolic Disorder Labs: Lab Results  Component Value Date   HGBA1C 5.1 06/23/2022   MPG 99.67 06/23/2022   No results found for: "PROLACTIN" Lab Results  Component Value Date   CHOL 164 06/23/2022   TRIG 32 06/23/2022   HDL 63 06/23/2022   CHOLHDL 2.6 06/23/2022   VLDL 6 06/23/2022   LDLCALC 95 06/23/2022    Physical Findings: AIMS: Facial  and Oral Movements Muscles of Facial Expression: None, normal Lips and Perioral Area: None, normal Jaw: None, normal Tongue: None, normal,Extremity Movements Upper (arms, wrists, hands, fingers): None, normal Lower (legs, knees, ankles, toes): None, normal, Trunk Movements Neck, shoulders, hips: None, normal, Overall Severity Severity of abnormal movements (highest score from questions above): None, normal Incapacitation due to abnormal movements: None, normal Patient's awareness of abnormal movements (rate only patient's report): No Awareness, Dental Status Current problems with teeth and/or dentures?: No Does patient usually wear dentures?: No   Musculoskeletal: Strength & Muscle Tone: within normal limits Gait & Station: normal Patient leans: N/A  Psychiatric Specialty Exam:  General Appearance: Appears stated age, fairly dressed and groomed  Psychomotor Activity: no tremors or akathisias, no restlessness  Eye Contact: Fair   Speech: responds to direct questions with brief answers, normal fluency  Mood: described as "decent" - appears calm but aloof  Affect: constricted  Thought Process: Concrete, more linear today  Thought Content/Perceptions: Has residual delusion of implanted device, residual AH that are reportedly improving, denies VH or thought broadcasting and has improving belief in thought insertion/withdrawal and sense of external control; reports delusion that the TV actors do not want him watching and makes some hyper-religious statements on exam; is not grossly responding to internal stimuli on  exam; no SI or HI; appears less paranoid on exam  Suicidal Thoughts: Denies SI, intention, plan   Homicidal Thoughts: Denies HI, intention, plan   Alertness/Orientation: Oriented to month, year and city - knows he is in the hospital due to mental health issues  Insight: Limited  Judgment: Improved - complying with meds  Memory: Limited  Executive Functions  Concentration: Fair Attention Span: Fair Recall: Limited Fund of Knowledge: Limited secondary to psychosis  Assets  Assets:Resilience, desire for improvement, social support, housing  Physical Exam Vitals and nursing note reviewed.  Constitutional:      Appearance: Normal appearance.  HENT:     Head: Normocephalic.  Pulmonary:     Effort: Pulmonary effort is normal.  Neurological:     General: No focal deficit present.     Mental Status: He is alert.    Review of Systems  Respiratory:  Negative for shortness of breath.   Cardiovascular:        Intermittent CP - nonspecific - presently resolved  Gastrointestinal:  Negative for constipation, diarrhea, nausea and vomiting.       Intermittent nonspecific abdominal pain - now resolved  Neurological:  Negative for dizziness and headaches.   Blood pressure 110/74, pulse 93, temperature 97.9 F (36.6 C), temperature source Oral, resp. rate 20, height '6\' 3"'  (1.905 m), weight 81.6 kg, SpO2 99 %. Body mass index is 22.5 kg/m.   PLAN: Treatment Plan Summary: Diagnoses / Active Problems: Unspecified schizophrenia spectrum and other psychotic d/o (r/o schizophrenia)   PLAN: Safety and Monitoring:             -- Voluntary admission to inpatient psychiatric unit for safety, stabilization and treatment             -- Daily contact with patient to assess and evaluate symptoms and progress in treatment             -- Patient's case to be discussed in multi-disciplinary team meeting             -- Observation Level : q15 minute checks             -- Vital signs:  q12  hours             --  Precautions: suicide, elopement, and assault   2. Psychiatric Diagnoses and Treatment:  Unspecified schizophrenia spectrum and other psychotic d/o (r/o schizophrenia) - Continue Zyprexa Zydis 19m qhs and 166mqam - partial efficacy - Continue Haldol 34m67mam and 7.34mg46ms for residual psychosis  (Lipid panel WNL, A1c 5.1, QTc 370ms53m on repeat 8/2 393ms,28mms o234m7 and 391ms on41m7) Rechecking EKG for QTC monitoring with Haldol dose titration - Continue Cogentin 0.34mg bid 734mle on 2 antipsychotics - monitoring AIMS and for EPS and constipation - Continue Inderal 10mg bid 24manxiety             -- Continue Vistaril 234mg tid [id number] for anxiety            -- Continue PRN trazodone 100mg at nig21mor sleep             -- New onset psychosis w/u (Head CT noncontrast negative on 7/24; folate 13.5, B12 578, UDS negative, TSH 2.775, ETOH <10, Mag 2.0, CMP WNL other than anion gap 4, repeat CBC WNL, ESR 0, HIV nonreactive, RPR nonreactive, heavy metal WNL, ANA negative, Ceruloplasmin low at 13.8, B1 85.7)  -- Encouraging pre-packaged foods and beverages to help with paranoia regarding meals -  Ensures ordered and po intake improving                          3.Medical Issues Being Addressed:              Leukopenia - resolved             -- Repeat WBC 5.4 and ANC 1800 on 8/1 and 6.1 with ANC 3300 on 8/17              Low Ceruloplasmin              -- 24 hour urine copper samples negative X2; Serum copper WNL at 72   Constipation   -- Continue Miralax qd and  Colace 100mg qd  -- 42mDulcolax PRN X1 if needed  -- encouraged po fluids and fiber   -- changed claritin to PRN   Nonspecific chest pain/abdominal pain  -- Repeating EKG   -- Asymptomatic today and eating and drinking well - constipation resolving - will monitor; afebrile   Nose Bleeds - intermittent  -- Advised not to pick nose and have order Saline spray and vaseline to use PRN for dryness   4. Discharge  Planning:              -- Social work and case management to assist with discharge planning and identification of hospital follow-up needs prior to discharge             -- Discharge Concerns: Need to establish a safety plan; Medication compliance and effectiveness             -- Discharge Goals: Return home with outpatient referrals for mental health follow-up including medication management/psychotherapy    Total Time Spent in Direct Patient Care:  I personally spent 30 minutes on the unit in direct patient care. The direct patient care time included face-to-face time with the patient, reviewing the patient's chart, communicating with other professionals, and coordinating care. Greater than 50% of this time was spent in counseling or coordinating care with the patient regarding goals of hospitalization, psycho-education, and discharge planning needs.   Abdalrahman Clementson E SingletHarlow Asa20/2023, 7:34 AM

## 2022-07-21 NOTE — Group Note (Signed)
[facility] LCSW Group Therapy Note  Date/Time:    07/21/2022 10:00-11:00AM  Type of Therapy and Topic:  Group Therapy:  Shame and its Impact on My Life  Participation Level:  Active   Description of Group:  The focus of this group was to examine our tendency to be hyper-critical of self and how this leads to feelings of worthlessness, hopelessness, and shame.  Patients were guided to the concept that shame is universal and is worsened by being kept hidden, but improved by being revealed.  We discussed how feeling unworthy is the result of shame and discussed the differences between guilt  ("I did something bad" "I made a mistake" "I did something stupid") and shame ("I am bad" "I am a mistake" "I am stupid") .  We discussed that feelings are not necessarily based in facts.  We also talked about what happens when we do something to numb our negative feelings and how that actually numbs our positive feelings at the same time.  Therapeutic Goals Identify statements patients automatically say to themselves, "I'll be worthy when...."  Examine how this is unkind to ourselves because it indicates we cannot be worthy until some far-reaching, possibly even unreachable, goal is achieved. Talk about the frequent use of unhealthy coping skills used when feeling unworthy Allow patients to discuss their shame out loud in order to reduce its power over them  Summary of Patient Progress: During group, patient was asked to complete the sentence "I'll be worthy when _____" and he responded that he feels he is already worthy.  This is in large part because of his faith.  He responded to all questions in real-time and was on topic.    Therapeutic Modalities Processing  Ambrose Mantle, LCSW 07/21/2022, 2:31 PM

## 2022-07-21 NOTE — Progress Notes (Signed)
   07/20/22 2300  Psych Admission Type (Psych Patients Only)  Admission Status Involuntary  Psychosocial Assessment  Patient Complaints Anxiety  Eye Contact Fair  Facial Expression Flat  Affect Appropriate to circumstance  Speech Logical/coherent  Interaction Assertive  Motor Activity Slow  Appearance/Hygiene Unremarkable  Behavior Characteristics Cooperative;Appropriate to situation  Mood Pleasant  Thought Process  Coherency WDL  Content WDL  Delusions None reported or observed  Perception WDL  Hallucination None reported or observed  Judgment Impaired  Confusion None  Danger to Self  Current suicidal ideation? Denies  Danger to Others  Danger to Others None reported or observed  Danger to Others Abnormal  Harmful Behavior to others No threats or harm toward other people  Destructive Behavior No threats or harm toward property

## 2022-07-22 MED ORDER — OLANZAPINE 5 MG PO TBDP
5.0000 mg | ORAL_TABLET | Freq: Every day | ORAL | Status: DC
  Administered 2022-07-23 – 2022-07-24 (×2): 5 mg via ORAL
  Filled 2022-07-22 (×3): qty 1

## 2022-07-22 MED ORDER — HALOPERIDOL 5 MG PO TABS
10.0000 mg | ORAL_TABLET | Freq: Every day | ORAL | Status: DC
  Administered 2022-07-22 – 2022-07-25 (×4): 10 mg via ORAL
  Filled 2022-07-22 (×7): qty 2

## 2022-07-22 NOTE — Progress Notes (Signed)
[facility] MD Progress Note  07/22/2022 8:55 AM Joseph Wilson  MRN:  [id number]  Chief Complaint: Psychosis  Reason for Admission:  Joseph Wilson is a 21 y.o., male with no past psychiatric history who presents to the [facility] from [facility] for evaluation and management of worsening psychosis and paranoia. According to outside records, the patient presented to the behavioral health unit for [place] via [place] police voluntarily as a walk-in with complaint paranoia, auditory and visual hallucinations.The patient is currently on Hospital Day 28.   Chart Review from last 24 hours:  The patient's chart was reviewed and nursing notes were reviewed. The patient's case was discussed in multidisciplinary team meeting. Per nursing, he was on topic in groups yesterday and had no acute behavioral issues noted.He was compliant with scheduled medications and did not require PRNs yesterday.  Information Obtained Today During Patient Interview: The patient was seen in his room. He admits he feels a little sleepy with his medications and has some nasal stuffiness today. He voices no other physical complaints today. He states he felt a little lightheaded earlier but this has resolved. He told nursing he had no AH today but on my exam states he had "a little" earlier this morning telling him not to lay down and not to go to group. He states he is trying to ignore the voices. He thinks the voice he is hearing is that of a You-tube star. He denies VH, does not make hyper-religious statements on exam, and denies belief in thought broadcasting. He states his sense of being controlled by something and of thought insertion/withdrawal by unknown persons is improving. He continues to believe he is implanted with a device but states he is not bothered by this thought. He denies SI or HI. He states his constipation has resolved and he is eating and drinking well. We discussed potential  dose titration up on his Haldol with hope we can come down on his daytime Zyprexa if he is feeling sleepy and he agrees with this plan. He was encouraged to consider a LAI of Haldol if he is improving on this med.   Sleep: 7.75 hours  Collateral call to father: Updated father on his progress with dose titration up on Haldol and discussed plans for continued dose increase in Haldol and attempt to reduce daytime Zyprexa to help with sleepiness. Father agrees that based on mother's interactions and his conversations with patient that patient is improving. Discussed eventual goal of monotherapy with Haldol which may have to be done as an outpatient. Discussed potential discharge planning later this week if he continues to improve.  Principal Problem: Schizophrenia spectrum disorder with psychotic disorder type not yet determined ([place]) Diagnosis: Principal Problem:   Schizophrenia spectrum disorder with psychotic disorder type not yet determined [facility])  Past Psychiatric History: See H&P  Past Medical History:  Past Medical History:  Diagnosis Date   Acid reflux     Family History:see H&P  Family Psychiatric  History: See H&P  Social History: See H&P  Current Medications: Current Facility-Administered Medications  Medication Dose Route Frequency Provider Last Rate Last Admin   acetaminophen (TYLENOL) tablet 650 mg  650 mg Oral Q6H PRN Rankin, Shuvon B, NP   650 mg at 07/02/22 2052   alum & mag hydroxide-simeth (MAALOX/MYLANTA) 200-200-20 MG/5ML suspension 30 mL  30 mL Oral Q4H PRN Rankin, Shuvon B, NP   30 mL at 07/16/22 0828   benztropine (COGENTIN) tablet 0.5 mg  0.5 mg Oral BID Nicholes Rough, NP   0.5 mg at 07/22/22 0810   bisacodyl (DULCOLAX) EC tablet 10 mg  10 mg Oral Once PRN Harlow Asa, MD       diphenhydrAMINE (BENADRYL) capsule 50 mg  50 mg Oral Q6H PRN Harlow Asa, MD       Or   diphenhydrAMINE (BENADRYL) injection 50 mg  50 mg Intramuscular Q6H PRN Nelda Marseille, Carlette Palmatier E,  MD       docusate sodium (COLACE) capsule 100 mg  100 mg Oral Daily Nelda Marseille, Shizuye Rupert E, MD   100 mg at 07/22/22 0810   feeding supplement (ENSURE ENLIVE / ENSURE PLUS) liquid 237 mL  237 mL Oral BID BM Massengill, Ovid Curd, MD   237 mL at 07/21/22 1430   fluticasone (FLONASE) 50 MCG/ACT nasal spray 1 spray  1 spray Each Nare Daily PRN Bobbitt, Shalon E, NP   1 spray at 07/08/22 2126   haloperidol (HALDOL) tablet 5 mg  5 mg Oral Daily Nelda Marseille, Merrianne Mccumbers E, MD   5 mg at 07/22/22 0810   haloperidol (HALDOL) tablet 7.5 mg  7.5 mg Oral QHS Nelda Marseille, Timoth Schara E, MD   7.5 mg at 07/21/22 2109   hydrOXYzine (ATARAX) tablet 25 mg  25 mg Oral TID PRN Dian Situ, MD   25 mg at 07/13/22 2036   loratadine (CLARITIN) tablet 10 mg  10 mg Oral Daily PRN Harlow Asa, MD       OLANZapine zydis (ZYPREXA) disintegrating tablet 5 mg  5 mg Oral Q8H PRN Nelda Marseille, Nazar Kuan E, MD       And   LORazepam (ATIVAN) tablet 1 mg  1 mg Oral PRN Harlow Asa, MD       And   ziprasidone (GEODON) injection 20 mg  20 mg Intramuscular PRN Nelda Marseille, Svara Twyman E, MD       magnesium hydroxide (MILK OF MAGNESIA) suspension 30 mL  30 mL Oral Daily PRN Rankin, Shuvon B, NP   30 mL at 07/16/22 0829   OLANZapine zydis (ZYPREXA) disintegrating tablet 10 mg  10 mg Oral Daily Nkwenti, Doris, NP   10 mg at 07/22/22 0810   OLANZapine zydis (ZYPREXA) disintegrating tablet 20 mg  20 mg Oral QHS Nkwenti, Doris, NP   20 mg at 07/21/22 2108   polyethylene glycol (MIRALAX / GLYCOLAX) packet 17 g  17 g Oral Daily Nelda Marseille, Makeba Delcastillo E, MD   17 g at 07/22/22 0810   propranolol (INDERAL) tablet 10 mg  10 mg Oral BID Nicholes Rough, NP   10 mg at 07/22/22 0810   sodium chloride (OCEAN) 0.65 % nasal spray 1 spray  1 spray Each Nare PRN Harlow Asa, MD       traZODone (DESYREL) tablet 100 mg  100 mg Oral QHS PRN Nicholes Rough, NP   100 mg at 07/20/22 2116   white petrolatum (VASELINE) gel   Topical PRN Harlow Asa, MD        Lab Results: No results found for  this or any previous visit (from the past 68 hour(s)).   Blood Alcohol level:  Lab Results  Component Value Date   ETH <10 81/19/1478    Metabolic Disorder Labs: Lab Results  Component Value Date   HGBA1C 5.1 06/23/2022   MPG 99.67 06/23/2022   No results found for: "PROLACTIN" Lab Results  Component Value Date   CHOL 164 06/23/2022   TRIG 32 06/23/2022   HDL 63 06/23/2022   CHOLHDL  2.6 06/23/2022   VLDL 6 06/23/2022   LDLCALC 95 06/23/2022    Physical Findings: AIMS: Facial and Oral Movements Muscles of Facial Expression: None, normal Lips and Perioral Area: None, normal Jaw: None, normal Tongue: None, normal,Extremity Movements Upper (arms, wrists, hands, fingers): None, normal Lower (legs, knees, ankles, toes): None, normal, Trunk Movements Neck, shoulders, hips: None, normal, Overall Severity Severity of abnormal movements (highest score from questions above): None, normal Incapacitation due to abnormal movements: None, normal Patient's awareness of abnormal movements (rate only patient's report): No Awareness, Dental Status Current problems with teeth and/or dentures?: No Does patient usually wear dentures?: No   Musculoskeletal: Strength & Muscle Tone: within normal limits Gait & Station: normal Patient leans: N/A  Psychiatric Specialty Exam:  General Appearance: Appears stated age, fairly dressed and groomed  Psychomotor Activity: no tremors or akathisias, no restlessness  Eye Contact: Good  Speech: responds to direct questions with brief answers, normal fluency  Mood: described as "neutral" - appears calm and more euthymic  Affect: constricted  Thought Process: Concrete, more linear today  Thought Content/Perceptions: Has residual delusion of implanted device, residual AH that are reportedly improving, denies VH or thought broadcasting and has improving belief in thought insertion/withdrawal and sense of external control; is not grossly responding  to internal stimuli on exam; no SI or HI; appears less paranoid on exam  Suicidal Thoughts: Denies SI, intention, plan   Homicidal Thoughts: Denies HI, intention, plan   Alertness/Orientation: Oriented to month, year and city and Software engineer and knows he is in the hospital due to mental health issues  Insight: Limited  Judgment: Improving- complying with meds  Memory: Fair  Community education officer  Concentration: Fair Attention Span: Fair Recall: [employer] of Knowledge: [place], desire for improvement, social support, housing  Physical Exam Vitals and nursing note reviewed.  Constitutional:      Appearance: Normal appearance.  HENT:     Head: Normocephalic.  Pulmonary:     Effort: Pulmonary effort is normal.  Neurological:     General: No focal deficit present.     Mental Status: He is alert.    Review of Systems  Respiratory:  Negative for shortness of breath.   Cardiovascular:  Negative for chest pain.  Gastrointestinal:  Negative for abdominal pain, constipation, diarrhea, nausea and vomiting.  Neurological:  Positive for dizziness. Negative for headaches.   Blood pressure 108/73, pulse 92, temperature 98 F (36.7 C), temperature source Oral, resp. rate 18, height _0  (1.905 m), weight 81.6 kg, SpO2 100 %. Body mass index is 22.5 kg/m.   PLAN: Treatment Plan Summary: Diagnoses / Active Problems: Unspecified schizophrenia spectrum and other psychotic d/o (r/o schizophrenia)   PLAN: Safety and Monitoring:             -- Voluntary admission to inpatient psychiatric unit for safety, stabilization and treatment             -- Daily contact with patient to assess and evaluate symptoms and progress in treatment             -- Patient's case to be discussed in multi-disciplinary team meeting             -- Observation Level : q15 minute checks             -- Vital signs:  q12 hours             -- Precautions: suicide, elopement, and assault  2. Psychiatric Diagnoses and Treatment:  Unspecified schizophrenia spectrum and other psychotic d/o (r/o schizophrenia) - Continue Zyprexa Zydis 71m qhs and reduce daytime dose from 164mto 58m73mue to c/o daytime sedation   - Continue Haldol 58mg50mm and increase to 10mg23m for residual psychosis - hope to eventually be able to cross-taper onto Haldol as monotherapy in the future (Lipid panel WNL, A1c 5.1, QTc 370ms 77mon repeat 8/2 393ms, 36ms on48m and 391ms on 59m and 406ms on 854m  - Continue Cogentin 0.58mg bid wh59m on 2 antipsychotics - monitoring AIMS and for EPS and constipation - Continue Inderal 10mg bid fo13mxiety             -- Continue Vistaril 258mg tid as 53med for anxiety            -- Continue PRN trazodone 100mg at night26m sleep             -- New onset psychosis w/u (Head CT noncontrast negative on 7/24; folate 13.5, B12 578, UDS negative, TSH 2.775, ETOH <10, Mag 2.0, CMP WNL other than anion gap 4, repeat CBC WNL, ESR 0, HIV nonreactive, RPR nonreactive, heavy metal WNL, ANA negative, Ceruloplasmin low at 13.8, B1 85.7)  -- Encouraging pre-packaged foods and beverages to help with paranoia regarding meals -  Ensures ordered and po intake improving                          3.Medical Issues Being Addressed:              Leukopenia - resolved             -- Repeat WBC 5.4 and ANC 1800 on 8/1 and 6.1 with ANC 3300 on 8/17              Low Ceruloplasmin              -- 24 hour urine copper samples negative X2; Serum copper WNL at 72   Constipation   -- Continue Miralax qd and  Colace 100mg qd  -- Ha11mlcolax PRN X1 if needed  -- encouraged po fluids and fiber   -- changed claritin to PRN    4. Discharge Planning:              -- Social work and case management to assist with discharge planning and identification of hospital follow-up needs prior to discharge             -- Discharge Concerns: Need to establish a safety plan; Medication compliance and  effectiveness             -- Discharge Goals: Return home with outpatient referrals for mental health follow-up including medication management/psychotherapy    Total Time Spent in Direct Patient Care:  I personally spent 40 minutes on the unit in direct patient care. The direct patient care time included face-to-face time with the patient, reviewing the patient's chart, communicating with other professionals, and coordinating care. Greater than 50% of this time was spent in counseling or coordinating care with the patient regarding goals of hospitalization, psycho-education, and discharge planning needs.   Winston Misner E SingletonHarlow Asa/2023, 8:55 AM

## 2022-07-22 NOTE — Progress Notes (Signed)
   07/22/22 0000  Psych Admission Type (Psych Patients Only)  Admission Status Involuntary  Psychosocial Assessment  Patient Complaints None  Eye Contact Fair  Facial Expression Animated  Affect Appropriate to circumstance  Speech Soft  Interaction Assertive  Motor Activity Slow  Appearance/Hygiene Unremarkable  Behavior Characteristics Cooperative;Appropriate to situation  Mood Pleasant  Thought Process  Coherency WDL  Content Preoccupation  Delusions None reported or observed  Perception WDL  Hallucination None reported or observed  Judgment Poor  Confusion None  Danger to Self  Current suicidal ideation? Denies  Danger to Others  Danger to Others None reported or observed  Danger to Others Abnormal  Harmful Behavior to others No threats or harm toward other people  Destructive Behavior No threats or harm toward property

## 2022-07-22 NOTE — Progress Notes (Signed)
   07/22/22 1000  Psych Admission Type (Psych Patients Only)  Admission Status Involuntary  Psychosocial Assessment  Patient Complaints None  Eye Contact Fair  Facial Expression Flat  Affect Appropriate to circumstance  Speech Soft  Interaction Assertive  Motor Activity Slow  Appearance/Hygiene Unremarkable  Behavior Characteristics Appropriate to situation  Mood Depressed  Thought Process  Coherency WDL  Content WDL  Delusions None reported or observed  Perception WDL  Hallucination None reported or observed  Judgment Impaired  Confusion None  Danger to Self  Current suicidal ideation? Denies  Danger to Others  Danger to Others None reported or observed

## 2022-07-22 NOTE — Group Note (Signed)
LCSW Group Therapy Note  Group Date: 07/22/2022 Start Time: 1300 End Time: 1400   Type of Therapy and Topic:  Group Therapy - Healthy vs Unhealthy Coping Skills  Participation Level:  Active   Description of Group The focus of this group was to determine what unhealthy coping techniques typically are used by group members and what healthy coping techniques would be helpful in coping with various problems. Patients were guided in becoming aware of the differences between healthy and unhealthy coping techniques. Patients were asked to identify 2-3 healthy coping skills they would like to learn to use more effectively.  Therapeutic Goals Patients learned that coping is what human beings do all day long to deal with various situations in their lives Patients defined and discussed healthy vs unhealthy coping techniques Patients identified their preferred coping techniques and identified whether these were healthy or unhealthy Patients determined 2-3 healthy coping skills they would like to become more familiar with and use more often. Patients provided support and ideas to each other   Summary of Patient Progress:  During group, Patient expressed healthy coping skills. Patient proved open to input from peers and feedback from CSW. Patient demonstrated fair insight into the subject matter, was respectful of peers, and participated throughout the entire session.   Therapeutic Modalities Cognitive Behavioral Therapy Motivational Interviewing  Marinda Elk, [place] 07/22/2022  2:29 PM

## 2022-07-22 NOTE — [facility] Group Notes (Signed)
Adult Psychoeducational Group Note  Date:  07/22/2022 Time:  9:54 AM  Group Topic/Focus:  Goals Group:   The focus of this group is to help patients establish daily goals to achieve during treatment and discuss how the patient can incorporate goal setting into their daily lives to aide in recovery.  Participation Level:  Did Not Attend    Joseph Wilson 07/22/2022, 9:54 AM

## 2022-07-22 NOTE — [facility] Group Notes (Signed)
Pt attended and contributed to group 

## 2022-07-23 NOTE — Progress Notes (Signed)
[facility] MD Progress Note  07/23/2022 10:17 AM Joseph Wilson  MRN:  [id number]   Reason for Admission:  Joseph Wilson is a 21 y.o., male with no past psychiatric history who presents to the [facility] from [facility] for evaluation and management of worsening psychosis and paranoia. According to outside records, the patient presented to the behavioral health unit for [place] via [place] police voluntarily as a walk-in with complaint paranoia, auditory and visual hallucinations.The patient is currently on Hospital Day 29.   Chart Review from last 24 hours:  The patient's chart was reviewed and nursing notes were reviewed. The patient's case was discussed in multidisciplinary team meeting. Per [facility] he is compliant with scheduled Zyprexa and scheduled Haldol.  Using as needed trazodone average every few nights with good efficacy for sleep, last use of Atarax as needed was 8/12, no need or use of as needed for agitation or aggression.  Staff report patient to be sleepy and less psychotic.  Information Obtained Today During Patient Interview: The patient was seen and evaluated in his room, he does appear sleepy and complaining of feeling sleepy this morning reports good sleep and appetite with further question he notes "I am doing good and bad" reporting good in form of less voices and eating better he reports less auditory hallucinations less loud and less frequent mumbling at times "quick words" but denies any commanding hallucinations to harm self or others.  He does deny any paranoia at this time inside the hospital or from outside the hospital.  In regard to feeling bad he reports "when I watch TV I feel it like threatening me" but unable to elaborate any further seems to be much less preoccupied with his thought disorder and delusions, much more linear in his thoughts able to answer questions in linear manner.  Denies side effect medications except for feeling  dizzy and sleepy, discussed with patient discontinuing morning dose of Zyprexa and will follow, he agrees.  He denies any symptoms consistent with EPS and upon evaluation this morning he does not have any sign consistent with EPS.  Denies SI or HI.  Reports mild visual hallucinations in form of "missed and something in the food but it is much better" last time earlier this morning.    Sleep  Sleep: Fair  Principal Problem: Schizophrenia spectrum disorder with psychotic disorder type not yet determined (HCC) Diagnosis: Principal Problem:   Schizophrenia spectrum disorder with psychotic disorder type not yet determined [facility])    Past Psychiatric History: See H&P  Past Medical History:  Past Medical History:  Diagnosis Date   Acid reflux    History reviewed. No pertinent surgical history. Family History: History reviewed. No pertinent family history. Family Psychiatric  History: See H&P Social History: See H&P  Current Medications: Current Facility-Administered Medications  Medication Dose Route Frequency Provider Last Rate Last Admin   acetaminophen (TYLENOL) tablet 650 mg  650 mg Oral Q6H PRN Rankin, Shuvon B, NP   650 mg at 07/02/22 2052   alum & mag hydroxide-simeth (MAALOX/MYLANTA) 200-200-20 MG/5ML suspension 30 mL  30 mL Oral Q4H PRN Rankin, Shuvon B, NP   30 mL at 07/16/22 0828   benztropine (COGENTIN) tablet 0.5 mg  0.5 mg Oral BID Starleen Blue, NP   0.5 mg at 07/23/22 9528   bisacodyl (DULCOLAX) EC tablet 10 mg  10 mg Oral Once PRN Comer Locket, MD       diphenhydrAMINE (BENADRYL) capsule 50 mg  50 mg Oral Q6H PRN Comer Locket, MD       Or   diphenhydrAMINE (BENADRYL) injection 50 mg  50 mg Intramuscular Q6H PRN Mason Jim, Amy E, MD       docusate sodium (COLACE) capsule 100 mg  100 mg Oral Daily Mason Jim, Amy E, MD   100 mg at 07/23/22 3614   feeding supplement (ENSURE ENLIVE / ENSURE PLUS) liquid 237 mL  237 mL Oral BID BM Massengill, Harrold Donath, MD   237 mL at  07/23/22 0954   fluticasone (FLONASE) 50 MCG/ACT nasal spray 1 spray  1 spray Each Nare Daily PRN Bobbitt, Shalon E, NP   1 spray at 07/08/22 2126   haloperidol (HALDOL) tablet 10 mg  10 mg Oral QHS Mason Jim, Amy E, MD   10 mg at 07/22/22 2103   haloperidol (HALDOL) tablet 5 mg  5 mg Oral Daily Bartholomew Crews E, MD   5 mg at 07/23/22 4315   hydrOXYzine (ATARAX) tablet 25 mg  25 mg Oral TID PRN Sarita Bottom, MD   25 mg at 07/13/22 2036   loratadine (CLARITIN) tablet 10 mg  10 mg Oral Daily PRN Comer Locket, MD   10 mg at 07/23/22 0820   OLANZapine zydis (ZYPREXA) disintegrating tablet 5 mg  5 mg Oral Q8H PRN Comer Locket, MD       And   LORazepam (ATIVAN) tablet 1 mg  1 mg Oral PRN Comer Locket, MD       And   ziprasidone (GEODON) injection 20 mg  20 mg Intramuscular PRN Mason Jim, Amy E, MD       magnesium hydroxide (MILK OF MAGNESIA) suspension 30 mL  30 mL Oral Daily PRN Rankin, Shuvon B, NP   30 mL at 07/16/22 0829   OLANZapine zydis (ZYPREXA) disintegrating tablet 20 mg  20 mg Oral QHS Nkwenti, Doris, NP   20 mg at 07/22/22 2104   OLANZapine zydis (ZYPREXA) disintegrating tablet 5 mg  5 mg Oral Daily Mason Jim, Amy E, MD   5 mg at 07/23/22 4008   polyethylene glycol (MIRALAX / GLYCOLAX) packet 17 g  17 g Oral Daily Mason Jim, Amy E, MD   17 g at 07/23/22 6761   propranolol (INDERAL) tablet 10 mg  10 mg Oral BID Starleen Blue, NP   10 mg at 07/23/22 9509   sodium chloride (OCEAN) 0.65 % nasal spray 1 spray  1 spray Each Nare PRN Comer Locket, MD       traZODone (DESYREL) tablet 100 mg  100 mg Oral QHS PRN Starleen Blue, NP   100 mg at 07/22/22 2104   white petrolatum (VASELINE) gel   Topical PRN Comer Locket, MD        Lab Results: No results found for this or any previous visit (from the past 48 hour(s)).  Blood Alcohol level:  Lab Results  Component Value Date   ETH <10 06/23/2022    Metabolic Disorder Labs: Lab Results  Component Value Date   HGBA1C 5.1  06/23/2022   MPG 99.67 06/23/2022   No results found for: "PROLACTIN" Lab Results  Component Value Date   CHOL 164 06/23/2022   TRIG 32 06/23/2022   HDL 63 06/23/2022   CHOLHDL 2.6 06/23/2022   VLDL 6 06/23/2022   LDLCALC 95 06/23/2022    Physical Findings: AIMS: Facial and Oral Movements Muscles of Facial Expression: None, normal Lips and Perioral Area: None, normal Jaw: None, normal Tongue: None, normal,Extremity Movements  Upper (arms, wrists, hands, fingers): None, normal Lower (legs, knees, ankles, toes): None, normal, Trunk Movements Neck, shoulders, hips: None, normal, Overall Severity Severity of abnormal movements (highest score from questions above): None, normal Incapacitation due to abnormal movements: None, normal Patient's awareness of abnormal movements (rate only patient's report): No Awareness, Dental Status Current problems with teeth and/or dentures?: No Does patient usually wear dentures?: No  CIWA:    COWS:     Musculoskeletal: Strength & Muscle Tone: within normal limits Gait & Station: normal Patient leans: N/A  Psychiatric Specialty Exam:  General Appearance: Appears of stated age, fairly dressed and groomed  Behavior: Cooperative and calm in general  Psychomotor Activity: Mild psychomotor retardation noted  Eye Contact: Fair yet limited Speech: Decreased amount but normal tone and volume noted   Mood: Euthymic Affect: Restricted affect but more fluent.  Thought Process: Mild thought blocking noted today, goal directed but concrete Descriptions of Associations: Intact Thought Content: Hallucinations: Reports auditory and visual hallucinations as noted above Delusions: No paranoia  Suicidal Thoughts: Denies SI, intention, plan  Homicidal Thoughts: Denies HI, intention, plan   Alertness/Orientation: Alert, limited orientation noted  Insight: Limited Judgment: Fair yet limited  Memory: Limited  Executive Functions  Concentration:  Limited Attention Span: Limited Recall: Limited Fund of Knowledge: Limited   Assets  Assets:Housing; Social Support; Manufacturing systems engineer    Physical Exam: Physical Exam Review of Systems  Constitutional: Negative.   HENT: Negative.    Eyes: Negative.   Respiratory: Negative.    Cardiovascular: Negative.   Gastrointestinal: Negative.   Genitourinary: Negative.   Musculoskeletal: Negative.   Skin: Negative.   Neurological:  Positive for dizziness.   Blood pressure 109/79, pulse 91, temperature 97.9 F (36.6 C), temperature source Oral, resp. rate 14, height 6\' 3"  (1.905 m), weight 81.6 kg, SpO2 100 %. Body mass index is 22.5 kg/m.   Treatment Plan Summary:   ASSESSMENT:  Diagnoses / Active Problems: Principal Problem: Schizophrenia spectrum disorder with psychotic disorder type not yet determined (HCC) Diagnosis: Principal Problem:   Schizophrenia spectrum disorder with psychotic disorder type not yet determined (HCC)   PLAN: Safety and Monitoring:  -- Voluntary admission to inpatient psychiatric unit for safety, stabilization and treatment  -- Daily contact with patient to assess and evaluate symptoms and progress in treatment  -- Patient's case to be discussed in multi-disciplinary team meeting  -- Observation Level : q15 minute checks  -- Vital signs:  q12 hours  -- Precautions: suicide, elopement, and assault  2. Medications:   Continue Zyprexa Zydis 20 mg at bedtime for psychosis, discontinue morning dose of Zyprexa to limit dizziness and sleepiness and monitor.    Continue Haldol 5 mg in the morning and 10 mg at bedtime to address psychosis.  Continue Cogentin 0.5 mg twice daily for EPS prophylaxis  Continue Inderal 10 mg twice daily for anxiety   Continue as needed trazodone for sleep  Continue as needed Atarax for anxiety  Continue as needed Zyprexa and Geodon for agitation, none needed since admission  Continue scheduled Colace helping for  constipation.  The risks/benefits/side-effects/alternatives to this medication were discussed in detail with the patient and his parents and time was given for questions. The patient consents to medication trial.    -- Metabolic profile and EKG monitoring obtained while on an atypical antipsychotic (BMI: Lipid Panel: HbgA1c: QTc:)      3. Pertinent labs: Reviewed      Lab ordered: None    4. Group and  Therapy: -- Encouraged patient to participate in unit milieu and in scheduled group therapies     -- Short Term Goals: Ability to identify changes in lifestyle to reduce recurrence of condition will improve, Ability to verbalize feelings will improve, Ability to disclose and discuss suicidal ideas, and Ability to demonstrate self-control will improve  -- Long Term Goals: Improvement in symptoms so as ready for discharge  6. Discharge Planning:   -- Social work and case management to assist with discharge planning and identification of hospital follow-up needs prior to discharge  -- Estimated LOS: 5-7 days  -- Discharge Concerns: Need to establish a safety plan; Medication compliance and effectiveness  -- Discharge Goals: Return home with outpatient referrals for mental health follow-up including medication management/psychotherapy      Total Time Spent in Direct Patient Care:  I personally spent 35 minutes on the unit in direct patient care. The direct patient care time included face-to-face time with the patient, reviewing the patient's chart, communicating with other professionals, and coordinating care. Greater than 50% of this time was spent in counseling or coordinating care with the patient regarding goals of hospitalization, psycho-education, and discharge planning needs.   Devinn Hurwitz Abbott Pao, MD 07/23/2022, 10:17 AM

## 2022-07-23 NOTE — Progress Notes (Signed)
   07/23/22 0000  Psych Admission Type (Psych Patients Only)  Admission Status Involuntary  Psychosocial Assessment  Patient Complaints None  Eye Contact Fair  Facial Expression Flat  Affect Blunted  Speech Soft  Interaction Assertive  Motor Activity Slow  Appearance/Hygiene Improved  Behavior Characteristics Calm;Cooperative  Mood Pleasant  Thought Process  Coherency WDL  Content WDL  Delusions None reported or observed  Perception WDL  Hallucination None reported or observed  Judgment Poor  Confusion None  Danger to Self  Current suicidal ideation? Denies  Danger to Others  Danger to Others None reported or observed  Danger to Others Abnormal  Harmful Behavior to others No threats or harm toward other people  Destructive Behavior No threats or harm toward property

## 2022-07-23 NOTE — Progress Notes (Signed)
   07/23/22 1100  Psych Admission Type (Psych Patients Only)  Admission Status Involuntary  Psychosocial Assessment  Patient Complaints None  Eye Contact Fair  Facial Expression Flat  Affect Appropriate to circumstance  Speech Soft  Interaction Assertive  Motor Activity Slow  Appearance/Hygiene Unremarkable  Behavior Characteristics Cooperative  Mood Depressed  Thought Process  Coherency WDL  Content WDL  Delusions None reported or observed  Perception WDL  Hallucination None reported or observed

## 2022-07-23 NOTE — Group Note (Addendum)
Recreation Therapy Group Note   Group Topic:Self-Esteem  Group Date: 07/23/2022 Start Time: 1005 End Time: 1104 Facilitators: Caroll Rancher, LRT,CTRS Location: [address] Dayroom   Goal Area(s) Addresses:  Patient will successfully identify positive attributes about themselves.  Patient will identify healthy ways to increase self-esteem. Patient will acknowledge benefit(s) of improved self-esteem.   Group Description: [place].  LRT and patients played a game of [place], which is played just like [place].  The deference is each block is either red, orange, green or blue.  Each patients took turns and whatever color block the patient selected, they would be asked a question about self esteem that corresponds to that color.  If the stack, falls over, the stack would be rebuilt and another round would be played.    Affect/Mood: Appropriate   Participation Level: Engaged   Participation Quality: Independent   Behavior: Appropriate   Speech/Thought Process: Focused   Insight: Good   Judgement: Good   Modes of Intervention: Cooperative Play   Patient Response to Interventions:  Engaged   Education Outcome:  Acknowledges education and In group clarification offered    Clinical Observations/Individualized Feedback: Pt was quiet but engaged.  Pt appeared to have a calm demeanor about him.  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  Pt would also be more confident in expressing himself and he likes to dance.  Lastly, pt described success as someone being completely satisfied in what they were able to accomplish.   Plan: Continue to engage patient in RT group sessions 2-3x/week.   Caroll Rancher, LRT,CTRS 07/23/2022 12:40 PM

## 2022-07-23 NOTE — Progress Notes (Signed)
The focus of this group is to help patients review their daily goal of treatment and discuss progress on daily workbooks. Pt did not attend the evening group. 

## 2022-07-24 ENCOUNTER — Encounter (HOSPITAL_COMMUNITY): Payer: Self-pay

## 2022-07-24 NOTE — Group Note (Signed)
LCSW Group Therapy Note   Group Date: 07/24/2022 Start Time: 1300 End Time: 1400  Type of Therapy and Topic:  Group Therapy:  Self-Care after Hospitalization  Participation Level:  Active   Description of Group This process group involved patients discussing how they plan to take care of themselves in a better manner when they get home from the hospital.  The Patients participated in a 10-Point Check-In where they went through the steps in an activity that includes naming and finding things from all 5 senses. The Patients also incorporated deep breathing into this activity.  We discussed a variety of other means of self-care which had a large range from hygiene activities to eating to setting boundaries to participating in peer support groups.   Therapeutic Goals Patient will identify and describe self-care activities to deliberately plan to use upon hospital discharge Patient will participate in generating additional ideas about healthy self-care options when they return to the community Patients will be supportive of one another and receive support from others   Summary of Patient Progress:  The Patient attended group and remained there the entire time.  The Patient accepted all worksheets and followed along throughout the group.  The Patient practiced the 10 check-in steps and was able to list/name items they can use with their 5 senses.  The Pt participated well with their peers and was engaged throughout the session.     Therapeutic Modalities Brief Solution-Focused Therapy Psychoeducation  Aram Beecham, [place] 07/24/2022  1:52 PM

## 2022-07-24 NOTE — Progress Notes (Addendum)
[facility] MD Progress Note  07/24/2022 11:04 AM Darrold MARQUINN MESCHKE  MRN:  [id number]   Reason for Admission:  Joseph Wilson is a 21 y.o., male with no past psychiatric history who presents to the [facility] from [facility] for evaluation and management of worsening psychosis and paranoia. According to outside records, the patient presented to the behavioral health unit for [place] via [place] police voluntarily as a walk-in with complaint paranoia, auditory and visual hallucinations.The patient is currently on Hospital Day 30.   Chart Review from last 24 hours:  The patient's chart was reviewed and nursing notes were reviewed. The patient's case was discussed in multidisciplinary team meeting. Per [facility] he is compliant with scheduled Zyprexa and scheduled Haldol.  Using as needed trazodone average every few nights with good efficacy for sleep, last use of Atarax as needed was 8/12, no need or use of as needed for agitation or aggression.  Staff report patient to be sleepy and less psychotic.  Information Obtained Today During Patient Interview: The patient was seen and evaluated in his room, he does continue to appear sleepy and complaining of feeling sleepy this morning reports good sleep and appetite he denies SI HI or AVH at time of evaluation and reports AVH "not really" but when asked about the last time had auditory or visual hallucination he notes it was last night and this morning but "not as loud it does not disturb me anymore" he tells me he is able to distract himself efficiently with no problem.  When asking him specifically about last time seeing demons he responds "it was last night just a little bit but I ignore that" he does not seem preoccupied with his delusions or psychosis as previously and seems to be having more linear lucid conversations.  He denies SI or HI and has been participating in groups with no issues reported.  When I discussed with him plan  of discharge later this week he tells me that he wants to start going to the [employer] to run the track and play basketball and also get more engaged with her family.  He does not present responding to stimuli or paranoid, he denies paranoia or other delusions when asked.   Attempted to contact patient's father on 8/23 for update but no response, will follow. Addendum: I was able in the afternoon to get in touch with patient's father over the phone, he reports he visited with patient in the hospital on Monday and per patient's father "it was the most impressive I have seen him he was very clearheaded" discussed current to progress and today's presentation with minimal psychosis but much less preoccupied with psychosis and responding to medications, discussed being currently on 2 antipsychotics including Haldol and Zyprexa with plan to discharge him home on current medications with recommendation to taper down Zyprexa very slowly on outpatient basis to avoid decompensation and titrate Haldol up if needed.  Discussed the plan to discharge patient Friday if continues to improve, father agrees.  Sleep  Fair  Principal Problem: Schizophrenia spectrum disorder with psychotic disorder type not yet determined (HCC) Diagnosis: Principal Problem:   Schizophrenia spectrum disorder with psychotic disorder type not yet determined [facility])    Past Psychiatric History: See H&P  Past Medical History:  Past Medical History:  Diagnosis Date   Acid reflux    History reviewed. No pertinent surgical history. Family History: History reviewed. No pertinent family history. Family Psychiatric  History: See H&P Social History:  See H&P  Current Medications: Current Facility-Administered Medications  Medication Dose Route Frequency Provider Last Rate Last Admin   acetaminophen (TYLENOL) tablet 650 mg  650 mg Oral Q6H PRN Rankin, Shuvon B, NP   650 mg at 07/02/22 2052   alum & mag hydroxide-simeth (MAALOX/MYLANTA)  200-200-20 MG/5ML suspension 30 mL  30 mL Oral Q4H PRN Rankin, Shuvon B, NP   30 mL at 07/16/22 0828   benztropine (COGENTIN) tablet 0.5 mg  0.5 mg Oral BID Starleen Blue, NP   0.5 mg at 07/24/22 6761   bisacodyl (DULCOLAX) EC tablet 10 mg  10 mg Oral Once PRN Comer Locket, MD       diphenhydrAMINE (BENADRYL) capsule 50 mg  50 mg Oral Q6H PRN Mason Jim, Amy E, MD       Or   diphenhydrAMINE (BENADRYL) injection 50 mg  50 mg Intramuscular Q6H PRN Mason Jim, Amy E, MD       docusate sodium (COLACE) capsule 100 mg  100 mg Oral Daily Mason Jim, Amy E, MD   100 mg at 07/24/22 9509   feeding supplement (ENSURE ENLIVE / ENSURE PLUS) liquid 237 mL  237 mL Oral BID BM Massengill, Harrold Donath, MD   237 mL at 07/24/22 1009   fluticasone (FLONASE) 50 MCG/ACT nasal spray 1 spray  1 spray Each Nare Daily PRN Bobbitt, Shalon E, NP   1 spray at 07/08/22 2126   haloperidol (HALDOL) tablet 10 mg  10 mg Oral QHS Mason Jim, Amy E, MD   10 mg at 07/23/22 2137   haloperidol (HALDOL) tablet 5 mg  5 mg Oral Daily Bartholomew Crews E, MD   5 mg at 07/24/22 3267   hydrOXYzine (ATARAX) tablet 25 mg  25 mg Oral TID PRN Sarita Bottom, MD   25 mg at 07/13/22 2036   loratadine (CLARITIN) tablet 10 mg  10 mg Oral Daily PRN Comer Locket, MD   10 mg at 07/23/22 0820   OLANZapine zydis (ZYPREXA) disintegrating tablet 5 mg  5 mg Oral Q8H PRN Comer Locket, MD       And   LORazepam (ATIVAN) tablet 1 mg  1 mg Oral PRN Comer Locket, MD       And   ziprasidone (GEODON) injection 20 mg  20 mg Intramuscular PRN Mason Jim, Amy E, MD       magnesium hydroxide (MILK OF MAGNESIA) suspension 30 mL  30 mL Oral Daily PRN Rankin, Shuvon B, NP   30 mL at 07/16/22 0829   OLANZapine zydis (ZYPREXA) disintegrating tablet 20 mg  20 mg Oral QHS Nkwenti, Doris, NP   20 mg at 07/23/22 2138   polyethylene glycol (MIRALAX / GLYCOLAX) packet 17 g  17 g Oral Daily Mason Jim, Amy E, MD   17 g at 07/24/22 1245   propranolol (INDERAL) tablet 10 mg  10  mg Oral BID Starleen Blue, NP   10 mg at 07/24/22 8099   sodium chloride (OCEAN) 0.65 % nasal spray 1 spray  1 spray Each Nare PRN Comer Locket, MD       traZODone (DESYREL) tablet 100 mg  100 mg Oral QHS PRN Starleen Blue, NP   100 mg at 07/22/22 2104   white petrolatum (VASELINE) gel   Topical PRN Comer Locket, MD        Lab Results: No results found for this or any previous visit (from the past 48 hour(s)).  Blood Alcohol level:  Lab Results  Component Value Date  ETH <10 06/23/2022    Metabolic Disorder Labs: Lab Results  Component Value Date   HGBA1C 5.1 06/23/2022   MPG 99.67 06/23/2022   No results found for: "PROLACTIN" Lab Results  Component Value Date   CHOL 164 06/23/2022   TRIG 32 06/23/2022   HDL 63 06/23/2022   CHOLHDL 2.6 06/23/2022   VLDL 6 06/23/2022   LDLCALC 95 06/23/2022    Physical Findings: AIMS: Facial and Oral Movements Muscles of Facial Expression: None, normal Lips and Perioral Area: None, normal Jaw: None, normal Tongue: None, normal,Extremity Movements Upper (arms, wrists, hands, fingers): None, normal Lower (legs, knees, ankles, toes): None, normal, Trunk Movements Neck, shoulders, hips: None, normal, Overall Severity Severity of abnormal movements (highest score from questions above): None, normal Incapacitation due to abnormal movements: None, normal Patient's awareness of abnormal movements (rate only patient's report): No Awareness, Dental Status Current problems with teeth and/or dentures?: No Does patient usually wear dentures?: No  CIWA:    COWS:     Musculoskeletal: Strength & Muscle Tone: within normal limits Gait & Station: normal Patient leans: N/A  Psychiatric Specialty Exam:  General Appearance: Appears of stated age, fairly dressed and groomed  Behavior: Cooperative and calm in general  Psychomotor Activity: Mild psychomotor retardation noted  Eye Contact: Fair yet limited Speech: Decreased amount but  normal tone and volume noted   Mood: Euthymic Affect: Restricted affect but more fluent.  Thought Process: Concrete, no thought blocking noted  Descriptions of Associations: Intact Thought Content: Hallucinations: Reports auditory and visual hallucinations as noted above Delusions: No paranoia or delusions noted Suicidal Thoughts: Denies SI, intention, plan  Homicidal Thoughts: Denies HI, intention, plan   Alertness/Orientation: Alert, limited orientation noted  Insight: Limited, improved Judgment: Fair yet limited  Memory: Personnel officer  Concentration: Limited Attention Span: Limited Recall: Limited Fund of Knowledge: Limited   Assets  Assets:Housing; Social Support; Manufacturing systems engineer    Physical Exam: Physical Exam Review of Systems  Constitutional: Negative.   HENT: Negative.    Eyes: Negative.   Respiratory: Negative.    Cardiovascular: Negative.   Gastrointestinal: Negative.   Genitourinary: Negative.   Musculoskeletal: Negative.   Skin: Negative.   Neurological:  Positive for dizziness.   Blood pressure 118/73, pulse 98, temperature 98 F (36.7 C), temperature source Oral, resp. rate 14, height 6\' 3"  (1.905 m), weight 81.6 kg, SpO2 98 %. Body mass index is 22.5 kg/m.   Treatment Plan Summary:   ASSESSMENT:  Diagnoses / Active Problems: Principal Problem: Schizophrenia spectrum disorder with psychotic disorder type not yet determined (HCC) Diagnosis: Principal Problem:   Schizophrenia spectrum disorder with psychotic disorder type not yet determined (HCC)   PLAN: Safety and Monitoring:  -- Voluntary admission to inpatient psychiatric unit for safety, stabilization and treatment  -- Daily contact with patient to assess and evaluate symptoms and progress in treatment  -- Patient's case to be discussed in multi-disciplinary team meeting  -- Observation Level : q15 minute checks  -- Vital signs:  q12 hours  -- Precautions:  suicide, elopement, and assault  2. Medications:   Continue Zyprexa Zydis 20 mg at bedtime for psychosis, discontinue morning dose of Zyprexa to limit dizziness and sleepiness and monitor.    Discontinue Inderal given patient is not anxious any longer and also to limit side effect of dizziness, will follow.  Continue Haldol 5 mg in the morning and 10 mg at bedtime to address psychosis.  Continue Cogentin 0.5 mg twice daily for  EPS prophylaxis  Continue Inderal 10 mg twice daily for anxiety   Continue as needed trazodone for sleep  Continue as needed Atarax for anxiety  Continue as needed Zyprexa and Geodon for agitation, none needed since admission  Continue scheduled Colace helping for constipation.  The risks/benefits/side-effects/alternatives to this medication were discussed in detail with the patient and his parents and time was given for questions. The patient consents to medication trial.    -- Metabolic profile and EKG monitoring obtained while on an atypical antipsychotic (BMI: Lipid Panel: HbgA1c: QTc:)      3. Pertinent labs: Reviewed      Lab ordered: None    4. Group and Therapy: -- Encouraged patient to participate in unit milieu and in scheduled group therapies     -- Short Term Goals: Ability to identify changes in lifestyle to reduce recurrence of condition will improve, Ability to verbalize feelings will improve, Ability to disclose and discuss suicidal ideas, and Ability to demonstrate self-control will improve  -- Long Term Goals: Improvement in symptoms so as ready for discharge  6. Discharge Planning:   -- Social work and case management to assist with discharge planning and identification of hospital follow-up needs prior to discharge  -- Estimated LOS: 5-7 days  -- Discharge Concerns: Need to establish a safety plan; Medication compliance and effectiveness  -- Discharge Goals: Return home with outpatient referrals for mental health follow-up including  medication management/psychotherapy      Total Time Spent in Direct Patient Care:  I personally spent 35 minutes on the unit in direct patient care. The direct patient care time included face-to-face time with the patient, reviewing the patient's chart, communicating with other professionals, and coordinating care. Greater than 50% of this time was spent in counseling or coordinating care with the patient regarding goals of hospitalization, psycho-education, and discharge planning needs.   Abid Bolla Abbott Pao, MD 07/24/2022, 11:04 AM

## 2022-07-24 NOTE — BH IP Treatment Plan (Signed)
Interdisciplinary Treatment and Diagnostic Plan Update  07/24/2022 Time of Session: 9:55am  Joseph Wilson MRN: [id number]  Principal Diagnosis: Schizophrenia spectrum disorder with psychotic disorder type not yet determined [facility])  Secondary Diagnoses: Principal Problem:   Schizophrenia spectrum disorder with psychotic disorder type not yet determined (HCC)   Current Medications:  Current Facility-Administered Medications  Medication Dose Route Frequency Provider Last Rate Last Admin   acetaminophen (TYLENOL) tablet 650 mg  650 mg Oral Q6H PRN Rankin, Shuvon B, NP   650 mg at 07/02/22 2052   alum & mag hydroxide-simeth (MAALOX/MYLANTA) 200-200-20 MG/5ML suspension 30 mL  30 mL Oral Q4H PRN Rankin, Shuvon B, NP   30 mL at 07/16/22 0828   benztropine (COGENTIN) tablet 0.5 mg  0.5 mg Oral BID Starleen Blue, NP   0.5 mg at 07/24/22 1607   bisacodyl (DULCOLAX) EC tablet 10 mg  10 mg Oral Once PRN Comer Locket, MD       diphenhydrAMINE (BENADRYL) capsule 50 mg  50 mg Oral Q6H PRN Comer Locket, MD       Or   diphenhydrAMINE (BENADRYL) injection 50 mg  50 mg Intramuscular Q6H PRN Mason Jim, Amy E, MD       docusate sodium (COLACE) capsule 100 mg  100 mg Oral Daily Mason Jim, Amy E, MD   100 mg at 07/24/22 3710   feeding supplement (ENSURE ENLIVE / ENSURE PLUS) liquid 237 mL  237 mL Oral BID BM Massengill, Harrold Donath, MD   237 mL at 07/24/22 1009   fluticasone (FLONASE) 50 MCG/ACT nasal spray 1 spray  1 spray Each Nare Daily PRN Bobbitt, Shalon E, NP   1 spray at 07/08/22 2126   haloperidol (HALDOL) tablet 10 mg  10 mg Oral QHS Mason Jim, Amy E, MD   10 mg at 07/23/22 2137   haloperidol (HALDOL) tablet 5 mg  5 mg Oral Daily Bartholomew Crews E, MD   5 mg at 07/24/22 6269   hydrOXYzine (ATARAX) tablet 25 mg  25 mg Oral TID PRN Sarita Bottom, MD   25 mg at 07/13/22 2036   loratadine (CLARITIN) tablet 10 mg  10 mg Oral Daily PRN Comer Locket, MD   10 mg at 07/23/22 0820   OLANZapine zydis  (ZYPREXA) disintegrating tablet 5 mg  5 mg Oral Q8H PRN Comer Locket, MD       And   LORazepam (ATIVAN) tablet 1 mg  1 mg Oral PRN Comer Locket, MD       And   ziprasidone (GEODON) injection 20 mg  20 mg Intramuscular PRN Mason Jim, Amy E, MD       magnesium hydroxide (MILK OF MAGNESIA) suspension 30 mL  30 mL Oral Daily PRN Rankin, Shuvon B, NP   30 mL at 07/16/22 0829   OLANZapine zydis (ZYPREXA) disintegrating tablet 20 mg  20 mg Oral QHS Nkwenti, Doris, NP   20 mg at 07/23/22 2138   polyethylene glycol (MIRALAX / GLYCOLAX) packet 17 g  17 g Oral Daily Mason Jim, Amy E, MD   17 g at 07/24/22 4854   sodium chloride (OCEAN) 0.65 % nasal spray 1 spray  1 spray Each Nare PRN Comer Locket, MD       traZODone (DESYREL) tablet 100 mg  100 mg Oral QHS PRN Starleen Blue, NP   100 mg at 07/22/22 2104   white petrolatum (VASELINE) gel   Topical PRN Comer Locket, MD       PTA Medications:  Medications Prior to Admission  Medication Sig Dispense Refill Last Dose   ASHWAGANDHA PO Take 1 capsule by mouth in the morning and at bedtime.      hydrOXYzine (ATARAX) 25 MG tablet Take 1 tablet (25 mg total) by mouth 3 (three) times daily as needed for anxiety. 30 tablet 0    Multiple Vitamins-Minerals (ADULT GUMMY PO) Take 1 tablet by mouth daily.      neomycin-bacitracin-polymyxin (NEOSPORIN) [phone] ointment Apply 1 Application topically daily as needed (Apply to affected area).      OLANZapine zydis (ZYPREXA) 5 MG disintegrating tablet Take 1 tablet (5 mg total) by mouth at bedtime.      traZODone (DESYREL) 50 MG tablet Take 1 tablet (50 mg total) by mouth at bedtime as needed for sleep.       Patient Stressors: Other: psychosis    Patient Strengths: Average or above average intelligence  Communication skills  Motivation for treatment/growth  Physical Health  Religious Affiliation  Supportive family/friends   Treatment Modalities: Medication Management, Group therapy, Case  management,  1 to 1 session with clinician, Psychoeducation, Recreational therapy.   Physician Treatment Plan for Primary Diagnosis: Schizophrenia spectrum disorder with psychotic disorder type not yet determined (HCC) Long Term Goal(s): Improvement in symptoms so as ready for discharge   Short Term Goals: Ability to identify changes in lifestyle to reduce recurrence of condition will improve Ability to verbalize feelings will improve Ability to disclose and discuss suicidal ideas Ability to demonstrate self-control will improve  Medication Management: Evaluate patient's response, side effects, and tolerance of medication regimen.  Therapeutic Interventions: 1 to 1 sessions, Unit Group sessions and Medication administration.  Evaluation of Outcomes: Progressing  Physician Treatment Plan for Secondary Diagnosis: Principal Problem:   Schizophrenia spectrum disorder with psychotic disorder type not yet determined (HCC)  Long Term Goal(s): Improvement in symptoms so as ready for discharge   Short Term Goals: Ability to identify changes in lifestyle to reduce recurrence of condition will improve Ability to verbalize feelings will improve Ability to disclose and discuss suicidal ideas Ability to demonstrate self-control will improve     Medication Management: Evaluate patient's response, side effects, and tolerance of medication regimen.  Therapeutic Interventions: 1 to 1 sessions, Unit Group sessions and Medication administration.  Evaluation of Outcomes: Progressing   RN Treatment Plan for Primary Diagnosis: Schizophrenia spectrum disorder with psychotic disorder type not yet determined (HCC) Long Term Goal(s): Knowledge of disease and therapeutic regimen to maintain health will improve  Short Term Goals: Ability to remain free from injury will improve, Ability to participate in decision making will improve, Ability to verbalize feelings will improve, Ability to disclose and discuss  suicidal ideas, and Ability to identify and develop effective coping behaviors will improve  Medication Management: RN will administer medications as ordered by provider, will assess and evaluate patient's response and provide education to patient for prescribed medication. RN will report any adverse and/or side effects to prescribing provider.  Therapeutic Interventions: 1 on 1 counseling sessions, Psychoeducation, Medication administration, Evaluate responses to treatment, Monitor vital signs and CBGs as ordered, Perform/monitor CIWA, COWS, AIMS and Fall Risk screenings as ordered, Perform wound care treatments as ordered.  Evaluation of Outcomes: Progressing   LCSW Treatment Plan for Primary Diagnosis: Schizophrenia spectrum disorder with psychotic disorder type not yet determined (HCC) Long Term Goal(s): Safe transition to appropriate next level of care at discharge, Engage patient in therapeutic group addressing interpersonal concerns.  Short Term Goals: Engage patient in aftercare  planning with referrals and resources, Increase social support, Increase emotional regulation, Facilitate acceptance of mental health diagnosis and concerns, Identify triggers associated with mental health/substance abuse issues, and Increase skills for wellness and recovery  Therapeutic Interventions: Assess for all discharge needs, 1 to 1 time with Social worker, Explore available resources and support systems, Assess for adequacy in community support network, Educate family and significant other(s) on suicide prevention, Complete Psychosocial Assessment, Interpersonal group therapy.  Evaluation of Outcomes: Progressing   Progress in Treatment: Attending groups: Yes. Participating in groups: Yes. Taking medication as prescribed: Yes. Toleration medication: Yes. Family/Significant other contact made: Yes, individual(s) contacted:  Altyreit (father) [phone] Patient understands diagnosis: Yes. Discussing  patient identified problems/goals with staff: Yes. Medical problems stabilized or resolved: Yes. Denies suicidal/homicidal ideation: Yes. Issues/concerns per patient self-inventory: Yes. Other: none   New problem(s) identified: No, Describe:  none   New Short Term/Long Term Goal(s): Patient to work towards elimination of symptoms of psychosis, medication management for mood stabilization\; development of comprehensive mental wellness plan.   Patient Goals:  No additional goals identified at this time. Patient to continue to work towards original goals identified in initial treatment team meeting. CSW will remain available to patient should they voice additional treatment goals.    Discharge Plan or Barriers: No psychosocial barriers identified at this time, patient to return to place of residence when appropriate for discharge.    Reason for Continuation of Hospitalization: Delusions    Estimated Length of Stay: 1-7 days      Last 3 [place] Suicide Severity Risk Score: Flowsheet Row [facility] (Current[facility] from 06/23/2022 in [facility]  C-SSRS RISK CATEGORY No Risk No Risk       Last PHQ 2/9 Scores:    06/23/2022    9:44 AM  Depression screen PHQ 2/9  Decreased Interest 0  Down, Depressed, Hopeless 0  PHQ - 2 Score 0    Scribe for Treatment Team: Aram Beecham, Theresia Majors 07/24/2022 11:44 AM

## 2022-07-24 NOTE — Progress Notes (Signed)
The focus of this group is to help patients review their daily goal of treatment and discuss progress on daily workbooks.  Pt attended the evening group and responded to all discussion prompts from the Writer. Pt shared that today was a good day on the unit, the highlight of which was going outside to the courtyard. "I played some basketball, but I mostly just enjoyed feeling the sun on my skin."  Pt told that, upon discharge, he planned to stay well by practicing coping skills and staying positive. Such coping skills included drinking water, counting to ten when upset, and thinking positively.  Pt rated his day a 9 out of 10 and his affect was appropriate.

## 2022-07-24 NOTE — Progress Notes (Signed)
Pt denies SI/HI/AVH and verbally agrees to approach staff if these become apparent or before harming themselves/others. Rates depression 0/10. Rates anxiety 2/10. Rates pain 0/10. Pt goal is to be positive. Pt was a little more interactive but not much more. Pt did smile at one interaction. Scheduled medications administered to pt, per MD orders. RN provided support and encouragement to pt. Q15 min safety checks implemented and continued. Pt safe on the unit. RN will continue to monitor and intervene as needed.   07/24/22 9735  Psych Admission Type (Psych Patients Only)  Admission Status Involuntary  Psychosocial Assessment  Patient Complaints Anxiety  Eye Contact Fair  Facial Expression Flat;Sad  Affect Appropriate to circumstance  Speech Soft  Interaction Forwards little  Motor Activity Slow  Appearance/Hygiene Unremarkable  Behavior Characteristics Cooperative;Appropriate to situation;Calm  Mood Anxious;Depressed  Thought Process  Coherency WDL  Content WDL  Delusions None reported or observed  Perception WDL  Hallucination None reported or observed  Judgment Impaired  Confusion None  Danger to Self  Current suicidal ideation? Denies  Danger to Others  Danger to Others None reported or observed  Danger to Others Abnormal  Harmful Behavior to others No threats or harm toward other people  Destructive Behavior No threats or harm toward property

## 2022-07-24 NOTE — Progress Notes (Signed)
D: Pt alert and oriented. Pt denies experiencing any anxiety/depression at this time. Pt denies experiencing any pain at this time. Pt denies experiencing any SI/HI, or AVH at this time.    A: Scheduled medications administered as prescribed.Support and encouragement provided. Routine safety checks conducted q15 minutes.   R: No adverse drug reactions noted. Pt verbally contracts for safety at this time, remains complaint with medications. Pt interacts minimally with others on the unit. Pt remains safe currently.

## 2022-07-25 MED ORDER — HALOPERIDOL 5 MG PO TABS: 5.0000 mg | ORAL_TABLET | Freq: Every day | ORAL | 0 refills | Status: AC

## 2022-07-25 MED ORDER — DOCUSATE SODIUM 100 MG PO CAPS: 100.0000 mg | ORAL_CAPSULE | Freq: Every day | ORAL | 0 refills | Status: AC

## 2022-07-25 MED ORDER — BENZTROPINE MESYLATE 0.5 MG PO TABS: 0.5000 mg | ORAL_TABLET | Freq: Two times a day (BID) | ORAL | 0 refills | Status: AC

## 2022-07-25 MED ORDER — OLANZAPINE 10 MG PO TABS
20.0000 mg | ORAL_TABLET | Freq: Every day | ORAL | Status: DC
  Administered 2022-07-25: 20 mg via ORAL
  Filled 2022-07-25 (×4): qty 2

## 2022-07-25 MED ORDER — OLANZAPINE 20 MG PO TABS
20.0000 mg | ORAL_TABLET | Freq: Every day | ORAL | 0 refills | Status: AC
Start: 2022-07-25 — End: ?

## 2022-07-25 MED ORDER — TRAZODONE HCL 100 MG PO TABS: 100.0000 mg | ORAL_TABLET | Freq: Every evening | ORAL | 0 refills | Status: AC | PRN

## 2022-07-25 MED ORDER — HALOPERIDOL 10 MG PO TABS: 10.0000 mg | ORAL_TABLET | Freq: Every day | ORAL | 0 refills | Status: AC

## 2022-07-25 NOTE — BHH Suicide Risk Assessment (Signed)
[facility] Discharge Suicide Risk Assessment   Principal Problem: Schizophrenia spectrum disorder with psychotic disorder type not yet determined [facility]) Discharge Diagnoses: Principal Problem:   Schizophrenia spectrum disorder with psychotic disorder type not yet determined (HCC)   Total Time spent with patient: 45 minutes  Reason for admission: Joseph Wilson is a 21 y.o., male with no past psychiatric history who presents to the [facility] from [facility] for evaluation and management of worsening psychosis and paranoia.  According to outside records, the patient presented to the behavioral health unit for [place] via [place] police voluntarily as a walk-in with complaint paranoia, auditory and visual hallucinations.  PTA Medications: None at home, was started on Zyprexa and trazodone and urgent care  Hospital Course:   During the patient's hospitalization, patient had extensive initial psychiatric evaluation, and follow-up psychiatric evaluations every day.  Psychiatric diagnoses provided upon initial assessment: Schizophrenia (HCC)  Patient's psychiatric medications were adjusted on admission: Start Abilify 5 mg daily for psychosis, titrate to 10 mg in the next few days for efficacy.  If effective will consider Abilify Maintena injection monthly to improve long-term compliance.             Zyprexa p.o. or IM as needed for psychotic agitation, monitor need and use.             Vistaril as needed for anxiety             Trazodone as needed for sleep  During the hospitalization, other adjustments were made to the patient's psychiatric medication regimen: Abilify was titrated up to 20 mg daily but patient's psychosis did not respond and he continues to be psychotic responding to stimuli was noted to auditory visual hallucination and delusions including paranoia.  Then Abilify was switched to Zyprexa which was titrated up gradually to a total daily dose  higher than FDA recommended dose 30 mg daily with some improvement noted.  He did not go back to baseline and improved significantly but continued to have some refractory psychosis so cross-taper between Zyprexa and risperidone was started but patient was noted to have significant worsening in regard to his psychosis.  Then risperidone was discontinued and Zyprexa was titrated up back to 30 mg total daily dose with improvement again in patient's psychosis but not complete improvement.  Haldol was added to target refractory psychosis with improvement noted and Zyprexa was tapered down gradually but not discontinued completely prior to discharge to avoid decompensation.  At time of discharge patient was on Haldol 5 mg daily in the morning and 10 mg at bedtime, Zyprexa 20 mg at bedtime both Haldol and Zyprexa to target psychotic symptoms with significant efficacy noted and no side effect, will plan to taper down and discontinue Zyprexa on outpatient basis and titrate up Haldol if needed. During hospitalization Cogentin was added 0.5 mg twice daily for EPS, trazodone was titrated up to 100 mg at bedtime as needed for sleep used intermittently during hospital stay with good efficacy noted, Atarax was used intermittently as needed for anxiety but last needed was on 8/12.  Inderal was added twice daily for anxiety and tachycardia but was discontinued later during hospital stay to avoid sleepiness side effect.  Please note all above medication changes were done with agreement from patient and his father and after discussing expected side effect as well as treatment plan.  Patient's care was discussed during the interdisciplinary team meeting every day during the hospitalization.  The patient denied having  side effects to prescribed psychiatric medication except for reporting some sleepiness and dizziness side effect which improved significantly with discontinuing Inderal as well as morning dose of  Zyprexa.  Gradually, patient started adjusting to milieu. The patient was evaluated each day by a clinical provider to ascertain response to treatment. Improvement was noted by the patient's report of decreasing symptoms, improved sleep and appetite, affect, medication tolerance, behavior, and participation in unit programming.  Patient was asked each day to complete a self inventory noting mood, mental status, pain, new symptoms, anxiety and concerns.    Symptoms were reported as significantly decreased or resolved completely by discharge.  Please note patient was admitted with overt psychosis including responding to stimuli hearing voices and seeing demons as well as paranoia, also had disorganized thought process at time of admission talking about advanced group of scientist trying to castrate him to make him a woman and was reporting tactile hallucinations feeling his testicles being pulled out.  Primarily during hospital stay he did continue to have above-noted psychotic symptoms and was noted to occasionally to be in his room fighting demons loudly.  With medication treatment the symptoms were noted to improve significantly that he did not report any paranoid delusions any further and when he reported any it was very minimal without any noted distress.  He also reported auditory hallucination to be thinking and mumbling in nature without any incident of commanding hallucinations.  He also reported visual hallucination to become less frequent and less intense and incidence of seeing demons becoming much less intense and distressing.  Patient became much less preoccupied with his psychosis and was able to participate in the milieu and groups and more efficient way.  Family during visitations reported significant improvement and in the last few days prior to discharge they noted that patient is almost back to baseline.  Patient has very supportive family to ensure compliance with medication and and follow-up  appointment after discharge.  On day of discharge, patient was evaluated on 07/26/2022 the patient reports that his mood is stable and denies depressed mood or anxiety. The patient continues to deny SI or HI or through the hospitalization including today.  He denies any AVH this morning but continues to report auditory and visual hallucinations minimal compared to the time of admission was last time last night and describing them to be not disturbing any longer.  He continues to report minimal paranoia compared to time of admission with no bizarre delusions noted, does not appear distressed about it. The patient is able to discuss his diagnosis with me which I discussed with him over the past few days and able to endorse importance to comply with medication and follow-up appointments after discharge, he reports motivated to continue taking medication with a goal of continued improvement in mental health.   The patient reports their target psychiatric symptoms of auditory visual hallucination, paranoia and delusions responded well to the psychiatric medications, and the patient reports overall benefit other psychiatric hospitalization. Supportive psychotherapy was provided to the patient. The patient also participated in regular group therapy while hospitalized. Coping skills, problem solving as well as relaxation therapies were also part of the unit programming.  Labs were reviewed with the patient, and abnormal results were discussed with the patient.  The patient is able to verbalize their individual safety plan to this provider.  Behavioral Events: None  Restraints: None  Groups: Attended, primarily with very limited participation secondary to psychosis but as his psychosis improved patient participated  more efficiently in groups.  Medications Changes: As above  D/C Medications: Haldol 5 mg in the morning and 10 mg at bedtime for psychosis, Zyprexa 20 mg at bedtime for psychosis, Cogentin 0.5 mg  twice daily for EPS prophylaxis, Colace 100 mg daily for constipation, trazodone 100 mg at bedtime as needed for sleep.  Sleep  Sleep: Improved significantly during hospital stay  Musculoskeletal: Strength & Muscle Tone: within normal limits Gait & Station: normal Patient leans: N/A  Psychiatric Specialty Exam  General Appearance: appears at stated age, fairly dressed and groomed  Behavior: pleasant and cooperative  Psychomotor Activity:No psychomotor agitation or retardation noted   Eye Contact: good Speech: normal amount, tone, volume and latency   Mood: euthymic Affect: congruent, pleasant and interactive  Thought Process: linear, goal directed, no circumstantial or tangential thought process noted, no racing thoughts or flight of ideas Descriptions of Associations: intact Thought Content: Hallucinations: denies AH, VH , does not appear responding to stimuli Delusions: No paranoia or other delusions noted Suicidal Thoughts: denies SI, intention, plan  Homicidal Thoughts: denies HI, intention, plan   Alertness/Orientation: alert and fully oriented  Insight: fair, improved Judgment: fair, improved  Memory: intact  Executive Functions  Concentration: intact  Attention Span: Fair Recall: intact Fund of Knowledge: fair   Art therapist  Concentration: intact Attention Span: Fair Recall: intact Fund of Knowledge: fair   Assets  Assets:Housing; Research scientist (medical); Manufacturing systems engineer   Physical Exam: Physical Exam ROS Blood pressure 121/81, pulse 100, temperature 98.4 F (36.9 C), temperature source Oral, resp. rate 18, height 6\' 3"  (1.905 m), weight 81.6 kg, SpO2 98 %. Body mass index is 22.5 kg/m.  Mental Status Per Nursing Assessment::   On Admission:  NA  Demographic Factors:  Male, Adolescent or young adult, and Unemployed  Loss Factors: NA  Historical Factors: NA  Risk Reduction Factors:   Living with another person, especially a  relative  Continued Clinical Symptoms: Improved significantly during hospital stay Schizophrenia:   Paranoid or undifferentiated type  Cognitive Features That Contribute To Risk:  Polarized thinking    Suicide Risk:  Minimal: No identifiable suicidal ideation.  Patients presenting with no risk factors but with morbid ruminations; may be classified as minimal risk based on the severity of the depressive symptoms   Follow-up Information     [email or web address] Go on 07/31/2022.   Why: You have an appointment for therapy services on  07/31/22 at 2:00 pm.     This appointment will be held in person. Contact information: [address] [place], [place] [phone]         [facility] Go on 08/16/2022.   Why: You have an appointment for medication management services on 08/16/22 at 3:00 pm.  This appointment will be held in person. Contact information: [address] [place] [phone]                 Plan Of Care/Follow-up recommendations:   Discharge recommendations:    Activity: as tolerated  Diet: heart healthy  # It is recommended to the patient to continue psychiatric medications as prescribed, after discharge from the hospital.     # It is recommended to the patient to follow up with your outpatient psychiatric provider and PCP.   # It was discussed with the patient, the impact of alcohol, drugs, tobacco have been there overall psychiatric and medical wellbeing, and total abstinence from substance  use was recommended the patient.ed.   # Prescriptions provided or sent directly to preferred pharmacy at discharge. Patient agreeable to plan. Given opportunity to ask questions. Appears to feel comfortable with discharge.    # In the event of worsening symptoms, the patient is instructed to call the crisis hotline, 911 and or go to the nearest ED for appropriate evaluation and  treatment of symptoms. To follow-up with primary care provider for other medical issues, concerns and or health care needs   # Patient was discharged home with a plan to follow up as noted above.   Patient agrees with D/C instructions and plan.  The patient received suicide prevention pamphlet:  Yes Belongings returned:  Clothing and Valuables  Total Time Spent in Direct Patient Care:  I personally spent 45 minutes on the unit in direct patient care. The direct patient care time included face-to-face time with the patient, reviewing the patient's chart, communicating with other professionals, and coordinating care. Greater than 50% of this time was spent in counseling or coordinating care with the patient regarding goals of hospitalization, psycho-education, and discharge planning needs.   Joseph Wilson 07/25/2022, 1:59 PM   Christine Morton Abbott Pao, MD 07/25/2022, 1:59 PM

## 2022-07-25 NOTE — Group Note (Signed)
Recreation Therapy Group Note   Group Topic:Team Building  Group Date: 07/25/2022 Start Time: 1000 End Time: 1030 Facilitators: Caroll Rancher, LRT,CTRS Location: [address] Dayroom  Goal Area(s) Addresses:  Patient will effectively work with peer towards shared goal.  Patient will identify skills used to make activity successful.  Patient will identify how skills used during activity can be used to reach post d/c goals.   Group Description:  Straw Bridge. In teams of 3-5, patients were given 15 plastic drinking straws and an equal length of masking tape. Using the materials provided, patients were instructed to build a free standing bridge-like structure to suspend an everyday item (ex: puzzle box) off of the floor or table surface. All materials were required to be used by the team in their design. LRT facilitated post-activity discussion reviewing team process. Patients were encouraged to reflect how the skills used in this activity can be generalized to daily life post discharge.    Affect/Mood: Appropriate   Participation Level: Active   Participation Quality: Independent   Behavior: Appropriate   Speech/Thought Process: Focused   Insight: Good   Judgement: Good   Modes of Intervention: STEM Activity   Patient Response to Interventions:  Engaged   Education Outcome:  Acknowledges education and In group clarification offered    Clinical Observations/Individualized Feedback: Pt appeared to be laid back but attentive.  Pt assisted peers as they constructed their bridge.  Pt agreed with peers they had to use communication and teamwork to complete activity.  Pt was quiet during discussion but would nod his head in agreement when certain points were made.  Pt identified something he is good at is basketball.  Pt didn't relay how those skills could be used with support system.    Plan: Continue to engage patient in RT group sessions 2-3x/week.   Caroll Rancher,  LRT,CTRS 07/25/2022 11:00 AM

## 2022-07-25 NOTE — Progress Notes (Signed)
   07/25/22 1200  Psych Admission Type (Psych Patients Only)  Admission Status Involuntary  Psychosocial Assessment  Patient Complaints None  Eye Contact Fair  Facial Expression Flat  Affect Sad  Speech Soft  Interaction Forwards little  Motor Activity Slow  Appearance/Hygiene Unremarkable  Behavior Characteristics Cooperative  Mood Depressed  Thought Process  Coherency WDL  Content WDL  Delusions None reported or observed  Perception WDL  Hallucination None reported or observed  Judgment Impaired  Confusion None  Danger to Self  Current suicidal ideation? Denies  Danger to Others  Danger to Others None reported or observed

## 2022-07-25 NOTE — BHH Group Notes (Signed)
Scale 1-10 6 Goal: ask someone how there doing today.

## 2022-07-25 NOTE — Progress Notes (Signed)
[facility] MD Progress Note  07/25/2022 10:46 AM Joseph Wilson  MRN:  [id number]   Reason for Admission:  Joseph Wilson is a 21 y.o., male with no past psychiatric history who presents to the [facility] from [facility] for evaluation and management of worsening psychosis and paranoia. According to outside records, the patient presented to the behavioral health unit for [place] via [place] police voluntarily as a walk-in with complaint paranoia, auditory and visual hallucinations.The patient is currently on Hospital Day 31.   Chart Review from last 24 hours:  The patient's chart was reviewed and nursing notes were reviewed. The patient's case was discussed in multidisciplinary team meeting. Per [facility] he is compliant with scheduled Zyprexa and scheduled Haldol.  Using as needed trazodone average every few nights with good efficacy for sleep, last use of Atarax as needed was 8/12, no need or use of as needed for agitation or aggression.  Staff report patient is interacting with the milieu was no overt psychosis reported.  Information Obtained Today During Patient Interview: The patient was seen and evaluated in his room, he does report feeling good this morning continues to report feeling tired but today he tells me that it is better than the past few days he denies any other side effect to medications.  He reports feeling better since admission and when asked him to elaborate he responds "cleared thoughts with voices and visions are way way less" he denies commanding hallucinations to harm self or others he denies SI or HI, he reports last time he heard voices was yesterday night and voices were mumbling "I could not make what it stays" he reports visual hallucinations seeing administered earlier today but "nothing concerning it was very very little" he denies any visual hallucinations seeing demons, when asked about paranoia or feelings that people are out to get him he  responds "a little bit" but denies feeling concerned about it.  He describes his mood as good and denies depressed mood or anxiety, presents calm.  Discussed with patient his diagnosis of schizophrenia and need to comply with medications after discharge and he agrees discussed the plan to discharge tomorrow if continues to do well and he feels motivated and excited about.   Phone call with father on 8/23: I was able in the afternoon to get in touch with patient's father over the phone, he reports he visited with patient in the hospital on Monday and per patient's father "it was the most impressive I have seen him he was very clearheaded" discussed current to progress and today's presentation with minimal psychosis but much less preoccupied with psychosis and responding to medications, discussed being currently on 2 antipsychotics including Haldol and Zyprexa with plan to discharge him home on current medications with recommendation to taper down Zyprexa very slowly on outpatient basis to avoid decompensation and titrate Haldol up if needed.  Discussed the plan to discharge patient Friday if continues to improve, father agrees.  Sleep  Fair  Principal Problem: Schizophrenia spectrum disorder with psychotic disorder type not yet determined ([place]) Diagnosis: Principal Problem:   Schizophrenia spectrum disorder with psychotic disorder type not yet determined [facility])    Past Psychiatric History: See H&P  Past Medical History:  Past Medical History:  Diagnosis Date   Acid reflux    History reviewed. No pertinent surgical history. Family History: History reviewed. No pertinent family history. Family Psychiatric  History: See H&P Social History: See H&P  Current Medications: Current Facility-Administered  Medications  Medication Dose Route Frequency Provider Last Rate Last Admin   acetaminophen (TYLENOL) tablet 650 mg  650 mg Oral Q6H PRN Rankin, Shuvon B, NP   650 mg at 07/02/22 2052   alum & mag  hydroxide-simeth (MAALOX/MYLANTA) 200-200-20 MG/5ML suspension 30 mL  30 mL Oral Q4H PRN Rankin, Shuvon B, NP   30 mL at 07/16/22 0828   benztropine (COGENTIN) tablet 0.5 mg  0.5 mg Oral BID Nicholes Rough, NP   0.5 mg at 07/25/22 [id number]   bisacodyl (DULCOLAX) EC tablet 10 mg  10 mg Oral Once PRN Harlow Asa, MD       diphenhydrAMINE (BENADRYL) capsule 50 mg  50 mg Oral Q6H PRN Nelda Marseille, Amy E, MD       Or   diphenhydrAMINE (BENADRYL) injection 50 mg  50 mg Intramuscular Q6H PRN Nelda Marseille, Amy E, MD       docusate sodium (COLACE) capsule 100 mg  100 mg Oral Daily Nelda Marseille, Amy E, MD   100 mg at 07/25/22 [id number]   feeding supplement (ENSURE ENLIVE / ENSURE PLUS) liquid 237 mL  237 mL Oral BID BM Massengill, Nathan, MD   237 mL at 07/25/22 1038   fluticasone (FLONASE) 50 MCG/ACT nasal spray 1 spray  1 spray Each Nare Daily PRN Bobbitt, Shalon E, NP   1 spray at 07/25/22 0810   haloperidol (HALDOL) tablet 10 mg  10 mg Oral QHS Singleton, Amy E, MD   10 mg at 07/24/22 2046   haloperidol (HALDOL) tablet 5 mg  5 mg Oral Daily Nelda Marseille, Amy E, MD   5 mg at 07/25/22 [id number]   hydrOXYzine (ATARAX) tablet 25 mg  25 mg Oral TID PRN Dian Situ, MD   25 mg at 07/13/22 2036   loratadine (CLARITIN) tablet 10 mg  10 mg Oral Daily PRN Harlow Asa, MD   10 mg at 07/25/22 0810   OLANZapine zydis (ZYPREXA) disintegrating tablet 5 mg  5 mg Oral Q8H PRN Harlow Asa, MD       And   LORazepam (ATIVAN) tablet 1 mg  1 mg Oral PRN Harlow Asa, MD       And   ziprasidone (GEODON) injection 20 mg  20 mg Intramuscular PRN Nelda Marseille, Amy E, MD       magnesium hydroxide (MILK OF MAGNESIA) suspension 30 mL  30 mL Oral Daily PRN Rankin, Shuvon B, NP   30 mL at 07/16/22 0829   OLANZapine zydis (ZYPREXA) disintegrating tablet 20 mg  20 mg Oral QHS Nkwenti, Doris, NP   20 mg at 07/24/22 2046   polyethylene glycol (MIRALAX / GLYCOLAX) packet 17 g  17 g Oral Daily Nelda Marseille, Amy E, MD   17 g at 07/25/22 [id number]    sodium chloride (OCEAN) 0.65 % nasal spray 1 spray  1 spray Each Nare PRN Harlow Asa, MD       traZODone (DESYREL) tablet 100 mg  100 mg Oral QHS PRN Nicholes Rough, NP   100 mg at 07/22/22 2104   white petrolatum (VASELINE) gel   Topical PRN Harlow Asa, MD        Lab Results: No results found for this or any previous visit (from the past 48 hour(s)).  Blood Alcohol level:  Lab Results  Component Value Date   ETH <10 [id number]    Metabolic Disorder Labs: Lab Results  Component Value Date   HGBA1C 5.1 06/23/2022   MPG 99.67 06/23/2022   No  results found for: "PROLACTIN" Lab Results  Component Value Date   CHOL 164 06/23/2022   TRIG 32 06/23/2022   HDL 63 06/23/2022   CHOLHDL 2.6 06/23/2022   VLDL 6 06/23/2022   LDLCALC 95 06/23/2022    Physical Findings: AIMS: Facial and Oral Movements Muscles of Facial Expression: None, normal Lips and Perioral Area: None, normal Jaw: None, normal Tongue: None, normal,Extremity Movements Upper (arms, wrists, hands, fingers): None, normal Lower (legs, knees, ankles, toes): None, normal, Trunk Movements Neck, shoulders, hips: None, normal, Overall Severity Severity of abnormal movements (highest score from questions above): None, normal Incapacitation due to abnormal movements: None, normal Patient's awareness of abnormal movements (rate only patient's report): No Awareness, Dental Status Current problems with teeth and/or dentures?: No Does patient usually wear dentures?: No  CIWA:    COWS:     Musculoskeletal: Strength & Muscle Tone: within normal limits Gait & Station: normal Patient leans: N/A  Psychiatric Specialty Exam:  General Appearance: Appears of stated age, fairly dressed and groomed  Behavior: Cooperative and calm in general  Psychomotor Activity: Mild psychomotor retardation noted  Eye Contact: Fair yet limited Speech: Decreased amount but normal tone and volume noted   Mood: Euthymic Affect:  Restricted affect but more fluent.  Thought Process: Concrete, no thought blocking noted  Descriptions of Associations: Intact Thought Content: Hallucinations: Reports auditory and visual hallucinations as noted above Delusions: No paranoia or delusions noted Suicidal Thoughts: Denies SI, intention, plan  Homicidal Thoughts: Denies HI, intention, plan   Alertness/Orientation: Alert, limited orientation noted  Insight: Limited, improved Judgment: Fair yet limited  Memory: Personnel officer  Concentration: Limited Attention Span: Limited Recall: Limited Fund of Knowledge: Limited   Assets  Assets:Housing; Social Support; Manufacturing systems engineer    Physical Exam: Physical Exam Review of Systems  Constitutional: Negative.   HENT: Negative.    Eyes: Negative.   Respiratory: Negative.    Cardiovascular: Negative.   Gastrointestinal: Negative.   Genitourinary: Negative.   Musculoskeletal: Negative.   Skin: Negative.   Neurological:  Positive for dizziness.   Blood pressure 121/81, pulse 100, temperature 98.4 F (36.9 C), temperature source Oral, resp. rate 18, height 6\' 3"  (1.905 m), weight 81.6 kg, SpO2 98 %. Body mass index is 22.5 kg/m.   Treatment Plan Summary:   ASSESSMENT:  Diagnoses / Active Problems: Principal Problem: Schizophrenia spectrum disorder with psychotic disorder type not yet determined ([facility]) Diagnosis: Principal Problem:   Schizophrenia spectrum disorder with psychotic disorder type not yet determined ([facility])   PLAN: Safety and Monitoring:  -- Voluntary admission to inpatient psychiatric unit for safety, stabilization and treatment  -- Daily contact with patient to assess and evaluate symptoms and progress in treatment  -- Patient's case to be discussed in multi-disciplinary team meeting  -- Observation Level : q15 minute checks  -- Vital signs:  q12 hours  -- Precautions: suicide, elopement, and assault  2. Medications:    Continue Zyprexa Zydis 20 mg at bedtime for psychosis, discontinue morning dose of Zyprexa to limit dizziness and sleepiness and monitor.    Discontinue Inderal given patient is not anxious any longer and also to limit side effect of dizziness, will follow.  Continue Haldol 5 mg in the morning and 10 mg at bedtime to address psychosis.  Continue Cogentin 0.5 mg twice daily for EPS prophylaxis  Continue Inderal 10 mg twice daily for anxiety   Continue as needed trazodone for sleep  Continue as needed Atarax for anxiety  Continue  as needed Zyprexa and Geodon for agitation, none needed since admission  Continue scheduled Colace helping for constipation.  The risks/benefits/side-effects/alternatives to this medication were discussed in detail with the patient and his parents and time was given for questions. The patient consents to medication trial.    -- Metabolic profile and EKG monitoring obtained while on an atypical antipsychotic (BMI: Lipid Panel: HbgA1c: QTc:)      3. Pertinent labs: Reviewed      Lab ordered: None    4. Group and Therapy: -- Encouraged patient to participate in unit milieu and in scheduled group therapies     -- Short Term Goals: Ability to identify changes in lifestyle to reduce recurrence of condition will improve, Ability to verbalize feelings will improve, Ability to disclose and discuss suicidal ideas, and Ability to demonstrate self-control will improve  -- Long Term Goals: Improvement in symptoms so as ready for discharge  6. Discharge Planning:   -- Social work and case management to assist with discharge planning and identification of hospital follow-up needs prior to discharge  -- Estimated LOS: 5-7 days  -- Discharge Concerns: Need to establish a safety plan; Medication compliance and effectiveness  -- Discharge Goals: Return home with outpatient referrals for mental health follow-up including medication management/psychotherapy      Total Time  Spent in Direct Patient Care:  I personally spent 35 minutes on the unit in direct patient care. The direct patient care time included face-to-face time with the patient, reviewing the patient's chart, communicating with other professionals, and coordinating care. Greater than 50% of this time was spent in counseling or coordinating care with the patient regarding goals of hospitalization, psycho-education, and discharge planning needs.   Helen Cuff Abbott Pao, MD 07/25/2022, 10:46 AM

## 2022-07-26 NOTE — Progress Notes (Signed)
Joseph Wilson  D/C'd Home per MD order.  Discussed with the patient and all questions fully answered. [facility] Discharge Suicide Risk Assessment given to patient and mother at the time of D/C.     An After Visit Summary was printed and given to the patient. Patient received prescription.  D/c education completed with patient/family including follow up instructions, medication list, d/c activities limitations if indicated, with other d/c instructions as indicated by MD - patient able to verbalize understanding, all questions fully answered.   Patient instructed to return to ED, call 911, or call MD for any changes in condition.   Patient escorted  to the main entrance , and D/C home via private auto.  Melvenia Needles 07/26/2022 2:38 PM

## 2022-07-26 NOTE — Discharge Summary (Signed)
Physician Discharge Summary Note  Patient:  Joseph Wilson is an 21 y.o., male MRN:  [id number] DOB:  2001/09/11 Patient phone:  [phone] (home)  Patient address:   [place] [address],  Total Time spent with patient: 45 minutes  Date of Admission:  06/24/2022 Date of Discharge: 07/26/2022  Reason for Admission:  Joseph Wilson is a 21 y.o., male with no past psychiatric history who presents to the [facility] from [facility] for evaluation and management of worsening psychosis and paranoia.  According to outside records, the patient presented to the behavioral health unit for [place] via [place] police voluntarily as a walk-in with complaint paranoia, auditory and visual hallucinations.  Principal Problem: Schizophrenia spectrum disorder with psychotic disorder type not yet determined [facility]) Discharge Diagnoses: Principal Problem:   Schizophrenia spectrum disorder with psychotic disorder type not yet determined [facility])   Past Psychiatric History: Prior Psychiatric diagnoses: None Past Psychiatric Hospitalizations: None   History of self mutilation: None Past suicide attempts: None Past history of HI, violent or aggressive behavior: None   Past Psychiatric medications trials: None History of ECT/TMS: None   Outpatient psychiatric Follow up: None Prior Outpatient Therapy: None  Past Medical History:  Past Medical History:  Diagnosis Date   Acid reflux    History reviewed. No pertinent surgical history. Family History: History reviewed. No pertinent family history. Family Psychiatric  History: Psychiatric illness: None reported Suicide: None reported Substance Abuse: None reported Social History:  Social History   Substance and Sexual Activity  Alcohol Use Never     Social History   Substance and Sexual Activity  Drug Use Never    Social History   Socioeconomic History   Marital status: Single     Spouse name: Not on file   Number of children: Not on file   Years of education: Not on file   Highest education level: Not on file  Occupational History   Not on file  Tobacco Use   Smoking status: Never   Smokeless tobacco: Never  Vaping Use   Vaping Use: Never used  Substance and Sexual Activity   Alcohol use: Never   Drug use: Never   Sexual activity: Not on file  Other Topics Concern   Not on file  Social History Narrative   Right handed   Occasionally caffeine   One story home   Social Determinants of Health   Financial Resource Strain: Not on file  Food Insecurity: Not on file  Transportation Needs: Not on file  Physical Activity: Not on file  Stress: Not on file  Social Connections: Not on file    Hospital Course:  During the patient's hospitalization, patient had extensive initial psychiatric evaluation, and follow-up psychiatric evaluations every day.   Psychiatric diagnoses provided upon initial assessment: Schizophrenia ([place])   Patient's psychiatric medications were adjusted on admission: Start Abilify 5 mg daily for psychosis, titrate to 10 mg in the next few days for efficacy.  If effective will consider Abilify Maintena injection monthly to improve long-term compliance.             Zyprexa p.o. or IM as needed for psychotic agitation, monitor need and use.             Vistaril as needed for anxiety             Trazodone as needed for sleep   During the hospitalization, other adjustments were made to the patient's psychiatric  medication regimen: Abilify was titrated up to 20 mg daily but patient's psychosis did not respond and he continues to be psychotic responding to stimuli was noted to auditory visual hallucination and delusions including paranoia.  Then Abilify was switched to Zyprexa which was titrated up gradually to a total daily dose higher than FDA recommended dose 30 mg daily with some improvement noted.  He did not go back to baseline and improved  significantly but continued to have some refractory psychosis so cross-taper between Zyprexa and risperidone was started but patient was noted to have significant worsening in regard to his psychosis.  Then risperidone was discontinued and Zyprexa was titrated up back to 30 mg total daily dose with improvement again in patient's psychosis but not complete improvement.  Haldol was added to target refractory psychosis with improvement noted and Zyprexa was tapered down gradually but not discontinued completely prior to discharge to avoid decompensation.  At time of discharge patient was on Haldol 5 mg daily in the morning and 10 mg at bedtime, Zyprexa 20 mg at bedtime both Haldol and Zyprexa to target psychotic symptoms with significant efficacy noted and no side effect, will plan to taper down and discontinue Zyprexa on outpatient basis and titrate up Haldol if needed. During hospitalization Cogentin was added 0.5 mg twice daily for EPS, trazodone was titrated up to 100 mg at bedtime as needed for sleep used intermittently during hospital stay with good efficacy noted, Atarax was used intermittently as needed for anxiety but last needed was on 8/12.  Inderal was added twice daily for anxiety and tachycardia but was discontinued later during hospital stay to avoid sleepiness side effect.   Please note all above medication changes were done with agreement from patient and his father and after discussing expected side effect as well as treatment plan.   Patient's care was discussed during the interdisciplinary team meeting every day during the hospitalization.   The patient denied having side effects to prescribed psychiatric medication except for reporting some sleepiness and dizziness side effect which improved significantly with discontinuing Inderal as well as morning dose of Zyprexa.   Gradually, patient started adjusting to milieu. The patient was evaluated each day by a clinical provider to ascertain  response to treatment. Improvement was noted by the patient's report of decreasing symptoms, improved sleep and appetite, affect, medication tolerance, behavior, and participation in unit programming.  Patient was asked each day to complete a self inventory noting mood, mental status, pain, new symptoms, anxiety and concerns.     Symptoms were reported as significantly decreased or resolved completely by discharge.  Please note patient was admitted with overt psychosis including responding to stimuli hearing voices and seeing demons as well as paranoia, also had disorganized thought process at time of admission talking about advanced group of scientist trying to castrate him to make him a woman and was reporting tactile hallucinations feeling his testicles being pulled out.  Primarily during hospital stay he did continue to have above-noted psychotic symptoms and was noted to occasionally to be in his room fighting demons loudly.  With medication treatment the symptoms were noted to improve significantly that he did not report any paranoid delusions any further and when he reported any it was very minimal without any noted distress.  He also reported auditory hallucination to be thinking and mumbling in nature without any incident of commanding hallucinations.  He also reported visual hallucination to become less frequent and less intense and incidence of seeing demons  becoming much less intense and distressing.  Patient became much less preoccupied with his psychosis and was able to participate in the milieu and groups and more efficient way.  Family during visitations reported significant improvement and in the last few days prior to discharge they noted that patient is almost back to baseline.  Patient has very supportive family to ensure compliance with medication and and follow-up appointment after discharge.   On day of discharge, patient was evaluated on 07/26/2022 the patient reports that his mood is  stable and denies depressed mood or anxiety. The patient continues to deny SI or HI or through the hospitalization including today.  He denies any AVH this morning but continues to report auditory and visual hallucinations minimal compared to the time of admission was last time last night and describing them to be not disturbing any longer.  He continues to report minimal paranoia compared to time of admission with no bizarre delusions noted, does not appear distressed about it. The patient is able to discuss his diagnosis with me which I discussed with him over the past few days and able to endorse importance to comply with medication and follow-up appointments after discharge, he reports motivated to continue taking medication with a goal of continued improvement in mental health.    The patient reports their target psychiatric symptoms of auditory visual hallucination, paranoia and delusions responded well to the psychiatric medications, and the patient reports overall benefit other psychiatric hospitalization. Supportive psychotherapy was provided to the patient. The patient also participated in regular group therapy while hospitalized. Coping skills, problem solving as well as relaxation therapies were also part of the unit programming.   Labs were reviewed with the patient, and abnormal results were discussed with the patient.   The patient is able to verbalize their individual safety plan to this provider.   Behavioral Events: None   Restraints: None   Groups: Attended, primarily with very limited participation secondary to psychosis but as his psychosis improved patient participated more efficiently in groups.   Medications Changes: As above   D/C Medications: Haldol 5 mg in the morning and 10 mg at bedtime for psychosis, Zyprexa 20 mg at bedtime for psychosis, Cogentin 0.5 mg twice daily for EPS prophylaxis, Colace 100 mg daily for constipation, trazodone 100 mg at bedtime as needed for  sleep.   Sleep  Sleep: Improved significantly during hospital stay    Physical Findings: AIMS: Facial and Oral Movements Muscles of Facial Expression: None, normal Lips and Perioral Area: None, normal Jaw: None, normal Tongue: None, normal,Extremity Movements Upper (arms, wrists, hands, fingers): None, normal Lower (legs, knees, ankles, toes): None, normal, Trunk Movements Neck, shoulders, hips: None, normal, Overall Severity Severity of abnormal movements (highest score from questions above): None, normal Incapacitation due to abnormal movements: None, normal Patient's awareness of abnormal movements (rate only patient's report): No Awareness, Dental Status Current problems with teeth and/or dentures?: No Does patient usually wear dentures?: No  CIWA:    COWS:     Musculoskeletal: Strength & Muscle Tone: within normal limits Gait & Station: normal Patient leans: N/A   Psychiatric Specialty Exam:  General Appearance: appears at stated age, fairly dressed and groomed  Behavior: pleasant and cooperative  Psychomotor Activity:No psychomotor agitation or retardation noted   Eye Contact: good Speech: normal amount, tone, volume and latency   Mood: euthymic Affect: congruent, pleasant and interactive  Thought Process: linear, goal directed, no circumstantial or tangential thought process noted, no racing thoughts or  flight of ideas Descriptions of Associations: intact Thought Content: Hallucinations: denies AH, VH , does not appear responding to stimuli Delusions: No paranoia or other delusions noted Suicidal Thoughts: denies SI, intention, plan  Homicidal Thoughts: denies HI, intention, plan   Alertness/Orientation: alert and fully oriented  Insight: fair, improved Judgment: fair, improved  Memory: intact  Executive Functions  Concentration: intact  Attention Span: Fair Recall: intact Fund of Knowledge: fair   Assets  Assets:Housing; Data processing manager;  Communication Skills   Sleep Sleep: good, improved during hospital stay   Physical Exam:  Physical Exam Constitutional:      Appearance: Normal appearance.  HENT:     Head: Normocephalic and atraumatic.  Pulmonary:     Effort: Pulmonary effort is normal.  Musculoskeletal:        General: Normal range of motion.  Skin:    General: Skin is warm and dry.  Neurological:     Mental Status: He is alert.    Review of Systems  All other systems reviewed and are negative.  Blood pressure 112/66, pulse (!) 119, temperature 97.7 F (36.5 C), temperature source Oral, resp. rate 18, height 6\' 3"  (1.905 m), weight 81.6 kg, SpO2 98 %. Body mass index is 22.5 kg/m.   Social History   Tobacco Use  Smoking Status Never  Smokeless Tobacco Never   Tobacco Cessation:  N/A, patient does not currently use tobacco products   Blood Alcohol level:  Lab Results  Component Value Date   ETH <10 [id number]    Metabolic Disorder Labs:  Lab Results  Component Value Date   HGBA1C 5.1 06/23/2022   MPG 99.67 06/23/2022   No results found for: "PROLACTIN" Lab Results  Component Value Date   CHOL 164 06/23/2022   TRIG 32 06/23/2022   HDL 63 06/23/2022   CHOLHDL 2.6 06/23/2022   VLDL 6 06/23/2022   [place] 95 06/23/2022    See Psychiatric Specialty Exam and Suicide Risk Assessment completed by Attending Physician prior to discharge.  Discharge destination:  Home  Is patient on multiple antipsychotic therapies at discharge:  Yes,   Do you recommend tapering to monotherapy for antipsychotics?  Yes  recommend to taper down Zyprexa further on outpatient basis and titrate Haldol up if needed for further management of psychosis.  Has Patient had three or more failed trials of antipsychotic monotherapy by history:  Yes,   Antipsychotic medications that previously failed include:   3.  Abilify, risperidone and zyprexa. No  Recommended Plan for Multiple Antipsychotic Therapies: Patient's  medications are in the process of a cross-taper;  medications include:  recommend to taper down Zyprexa further on outpatient basis and titrate Haldol up if needed for further management of psychosis.  Discharge Instructions     Diet - low sodium heart healthy   Complete by: As directed    Increase activity slowly   Complete by: As directed       Allergies as of 07/26/2022       Reactions   Omeprazole Other (See Comments)   Reaction unknown        Medication List     STOP taking these medications    ADULT GUMMY PO   ASHWAGANDHA PO   hydrOXYzine 25 MG tablet Commonly known as: ATARAX   neomycin-bacitracin-polymyxin [phone] ointment   OLANZapine zydis 5 MG disintegrating tablet Commonly known as: ZYPREXA Replaced by: OLANZapine 20 MG tablet       TAKE these medications      Indication  benztropine 0.5 MG tablet Commonly known as: COGENTIN Take 1 tablet (0.5 mg total) by mouth 2 (two) times daily.  Indication: Extrapyramidal Reaction caused by Medications   docusate sodium 100 MG capsule Commonly known as: COLACE Take 1 capsule (100 mg total) by mouth daily.  Indication: Constipation   haloperidol 10 MG tablet Commonly known as: HALDOL Take 1 tablet (10 mg total) by mouth at bedtime.  Indication: Psychosis   haloperidol 5 MG tablet Commonly known as: HALDOL Take 1 tablet (5 mg total) by mouth daily.  Indication: Psychosis   OLANZapine 20 MG tablet Commonly known as: ZYPREXA Take 1 tablet (20 mg total) by mouth at bedtime. Replaces: OLANZapine zydis 5 MG disintegrating tablet  Indication: Schizophrenia   traZODone 100 MG tablet Commonly known as: DESYREL Take 1 tablet (100 mg total) by mouth at bedtime as needed for sleep. What changed:  medication strength how much to take  Indication: [place]. Go on 07/31/2022.   Why: You have an appointment for therapy  services on  07/31/22 at 2:00 pm.     This appointment will be held in person. Contact information: [address], [place], [place] [address] [phone]         [place]. Go on 08/16/2022.   Why: You have an appointment for medication management services on 08/16/22 at 3:00 pm.  This appointment will be held in person. Contact information: [place] [address] [phone]                 Discharge recommendations:   Activity: as tolerated  Diet: heart healthy  # It is recommended to the patient to continue psychiatric medications as prescribed, after discharge from the hospital.     # It is recommended to the patient to follow up with your outpatient psychiatric provider and PCP.   # It was discussed with the patient, the impact of alcohol, drugs, tobacco have been there overall psychiatric and medical wellbeing, and total abstinence from substance use was recommended the patient.ed.   # Prescriptions provided or sent directly to preferred pharmacy at discharge. Patient agreeable to plan. Given opportunity to ask questions. Appears to feel comfortable with discharge.    # In the event of worsening symptoms, the patient is instructed to call the crisis hotline, 911 and or go to the nearest ED for appropriate evaluation and treatment of symptoms. To follow-up with primary care provider for other medical issues, concerns and or health care needs   # Patient was discharged home with a plan to follow up as noted above.   Patient agrees with D/C instructions and plan.   The patient received suicide prevention pamphlet:  Yes Belongings returned:  Clothing and Valuables  Total Time Spent in Direct Patient Care:  I personally spent 45 minutes on the unit in direct patient care. The direct patient care time included face-to-face time with the patient, reviewing the patient's chart, communicating with other  professionals, and coordinating care. Greater than 50% of this time was spent in counseling or coordinating care with the patient regarding goals of hospitalization, psycho-education, and discharge planning needs.    SignedDian Situ, MD 07/26/2022, 10:15 AM

## 2022-07-26 NOTE — Group Note (Signed)
LCSW Group Therapy Note   Group Date: 07/26/2022 Start Time: 1300 End Time: 1400  Type of Therapy and Topic:  Group Therapy - Healthy vs Unhealthy Coping Skills  Participation Level:  Active   Description of Group The focus of this group was to determine what unhealthy coping techniques typically are used by group members and what healthy coping techniques would be helpful in coping with various problems. Patients were guided in becoming aware of the differences between healthy and unhealthy coping techniques. Patients were asked to identify 2-3 healthy coping skills they would like to learn to use more effectively.  Therapeutic Goals Patients learned that coping is what human beings do all day long to deal with various situations in their lives Patients defined and discussed healthy vs unhealthy coping techniques Patients identified their preferred coping techniques and identified whether these were healthy or unhealthy Patients determined 2-3 healthy coping skills they would like to become more familiar with and use more often. Patients provided support and ideas to each other   Summary of Patient Progress:  Pt was discharged during group.    Therapeutic Modalities Cognitive Behavioral Therapy Motivational Interviewing  Aram Beecham, [place] 07/26/2022  1:40 PM

## 2022-07-26 NOTE — B[facility] Group Notes (Signed)
Adult Psychoeducational Group Note  Date:  07/26/2022 Time:  10:18 AM  Group Topic/Focus:  Goals Group:   The focus of this group is to help patients establish daily goals to achieve during treatment and discuss how the patient can incorporate goal setting into their daily lives to aide in recovery.  Participation Level:  Active  Participation Quality:  Appropriate  Affect:  Appropriate  Cognitive:  Appropriate  Insight: Appropriate  Engagement in Group:  Engaged  Modes of Intervention:  Discussion    Donell Beers 07/26/2022, 10:18 AM

## 2022-07-26 NOTE — Progress Notes (Signed)
  [facility] Adult Case Management Discharge Plan :  Will you be returning to the same living situation after discharge:Yes,  Home    At discharge, do you have transportation home?: Yes,  Parents Do you have the ability to pay for your medications: Yes,  Insurance  Release of information consent forms completed and in the chart;  Patient's signature needed at discharge.  Patient to Follow up at:  Follow-up Information     Center, [facility]. Go on 07/31/2022.   Why: You have an appointment for therapy services on  07/31/22 at 2:00 pm.     This appointment will be held in person. Contact information: [address] [place], [place] [address] [phone]         [facility] Go on 08/16/2022.   Why: You have an appointment for medication management services on 08/16/22 at 3:00 pm.  This appointment will be held in person. Contact information: [address] [place] [address] [phone]                 Next level of care provider has access to [facility] Link:no  Safety Planning and Suicide Prevention discussed: Yes,  Altyreit Amsden (father) [phone]     Has patient been referred to the Quitline?: N/A patient is not a smoker  Patient has been referred for addiction treatment: N/A  Ane Payment, LCSWA 07/26/2022, 10:54 AM

## 2022-11-18 IMAGING — RF DG ESOPHAGUS
7 series · 14 of 24 positions shown · non-contrast
Comparison: None.

CLINICAL DATA: Esophageal dysphagia. Sensation of food sticking in
the mid chest

EXAM:
ESOPHOGRAM / BARIUM SWALLOW / BARIUM TABLET STUDY
TECHNIQUE: Combined double contrast and single contrast examination performed
using effervescent crystals, thick barium liquid, and thin barium
liquid. The patient was observed with fluoroscopy swallowing a 13 mm
barium sulphate tablet.
FLUOROSCOPY TIME:  Fluoroscopy Time:  2 minutes 12 seconds
Radiation Exposure Index (if provided by the fluoroscopic device):
49 mGy
Number of Acquired Spot Images: 0

[Series 1: sequence · 2 of 47 frames shown (1 of 6)]
[frame 1/47]
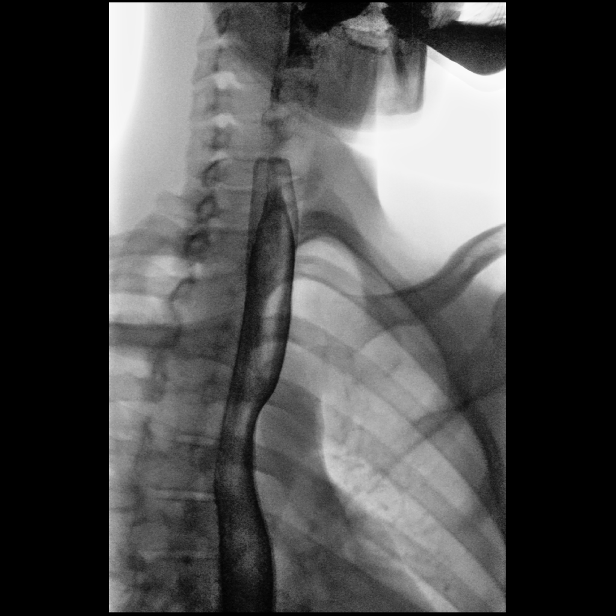
[frame 24/47]
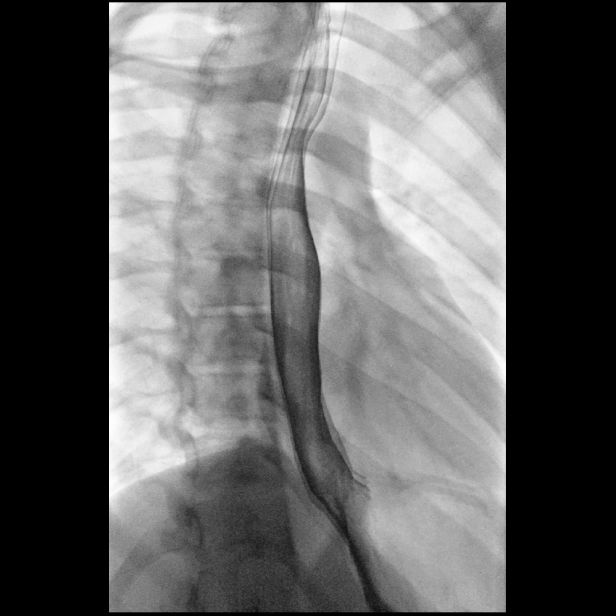

[Series 2: one shot · 1 of 1 slices shown]
[im 1/1]
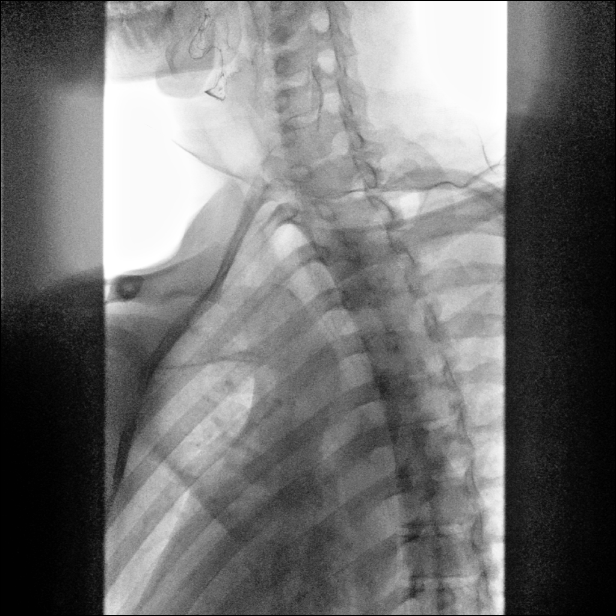

[Series 3: sequence · 2 of 18 frames shown (2 of 6)]
[frame 8/18]
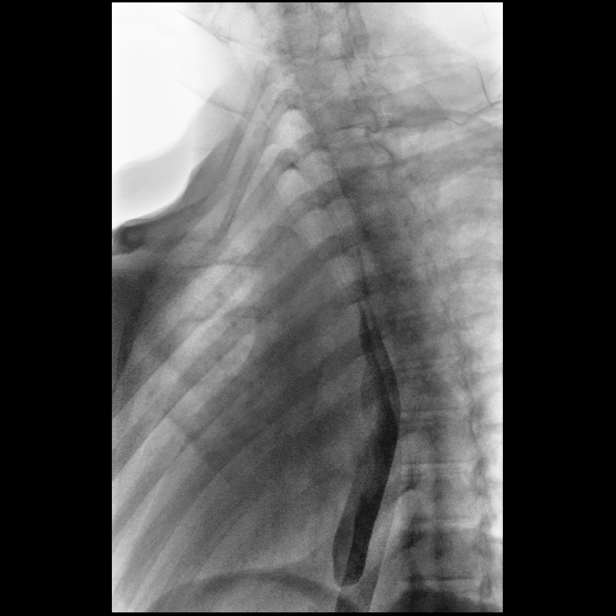
[frame 10/18]
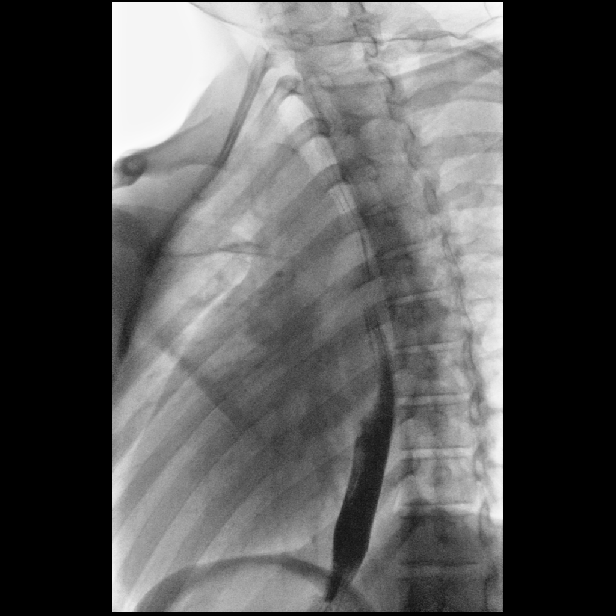

[Series 4: sequence · 2 of 30 frames shown (3 of 6)]
[frame 5/30]
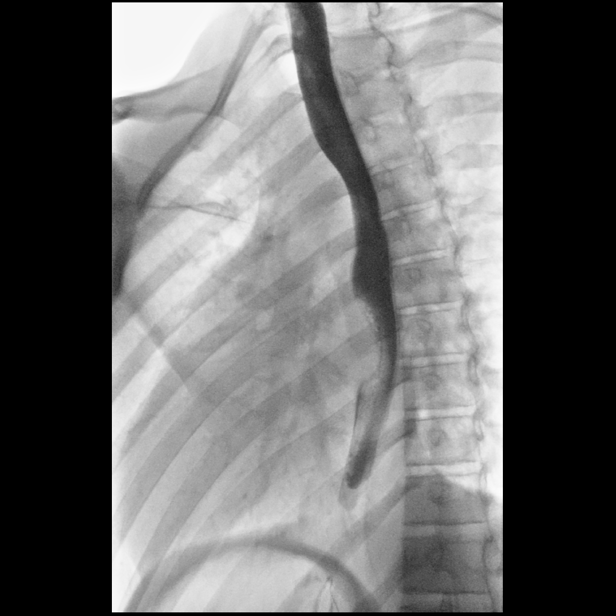
[frame 16/30]
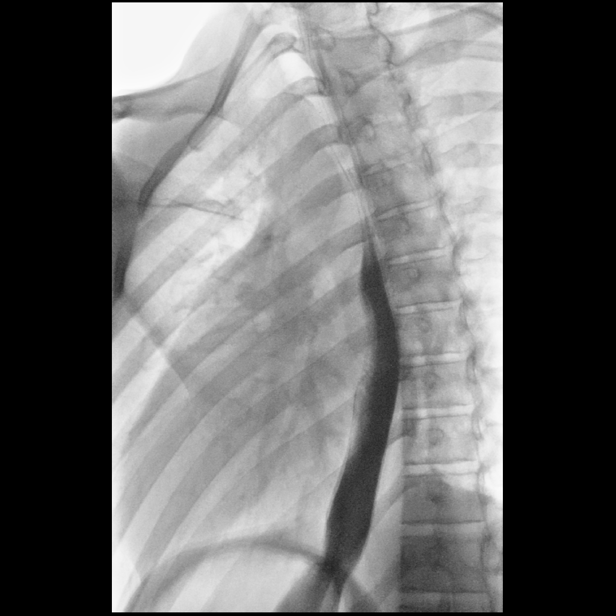

[Series 5: sequence · 2 of 37 frames shown (4 of 6)]
[frame 4/37]
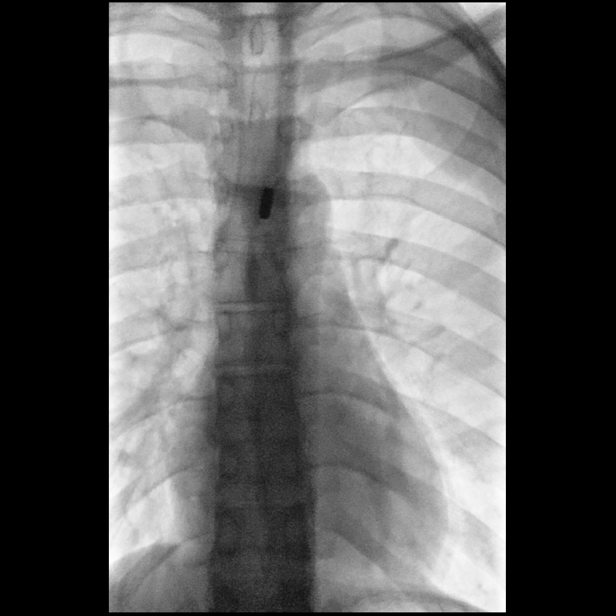
[frame 19/37]
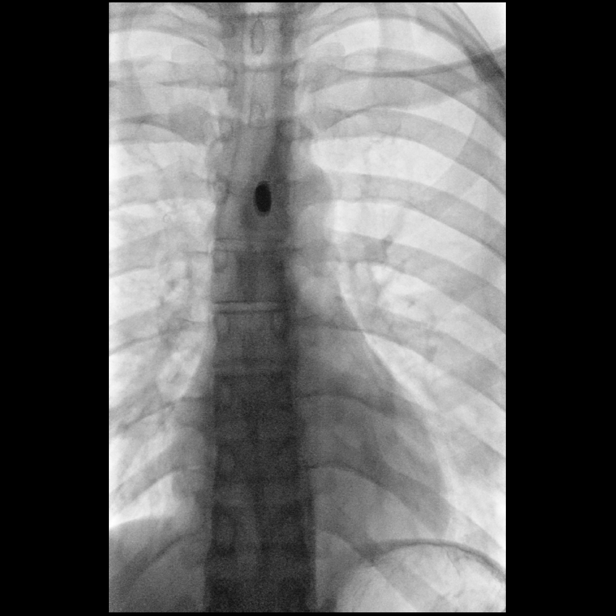

[Series 6: sequence · 3 of 32 frames shown (5 of 6)]
[frame 5/32]
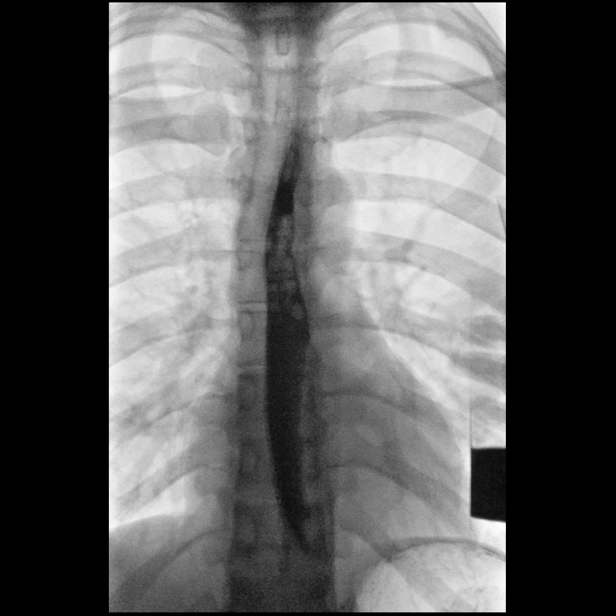
[frame 23/32]
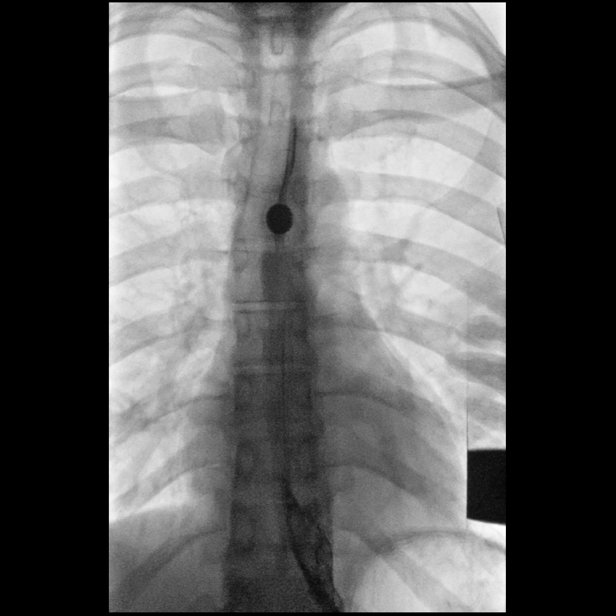
[frame 28/32]
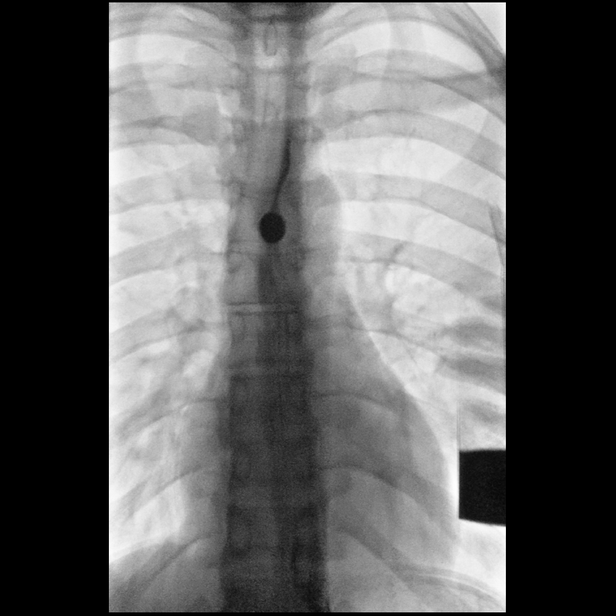

[Series 7: sequence · 2 of 57 frames shown (6 of 6)]
[frame 9/57]
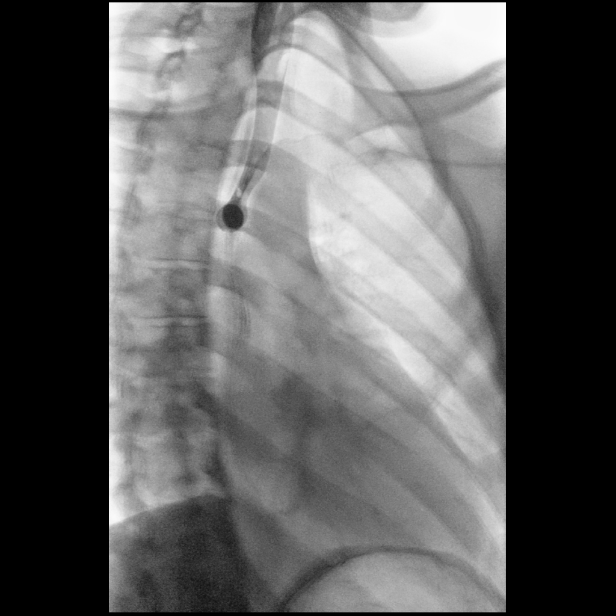
[frame 49/57]
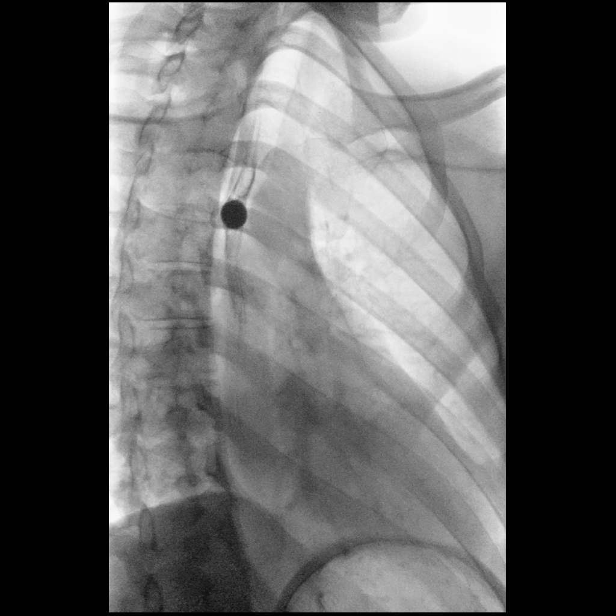

[14 of 24 positions shown; findings below may reference images not displayed]

FINDINGS: Fluoroscopic evaluation of swallowing demonstrates normal esophageal
motility. Within the mid esophagus, near and just below the level of
the aortic arch, there is slight smooth narrowing of the esophagus.
This did not appear significant initially. However, upon swallowing
a 13 mm barium tablet, this sticks in this area and reproduces the
patient's symptoms.

No reflux with the water siphon maneuver.
IMPRESSION: Slight smooth narrowing within the mid esophagus at and just below
the level of the aortic arch. The 13 mm barium tablet sticks in this
area and reproduces the patient's symptoms. Consider further
evaluation with direct visualization/endoscopy.
